# Patient Record
Sex: Male | Born: 1940 | Race: Asian | Hispanic: No | Marital: Married | Smoking: Former smoker
Health system: Southern US, Community
[De-identification: ages and names within clinical notes are randomized; demographics above are authoritative.]

## PROBLEM LIST (undated history)

## (undated) DIAGNOSIS — E785 Hyperlipidemia, unspecified: Secondary | ICD-10-CM

## (undated) DIAGNOSIS — K219 Gastro-esophageal reflux disease without esophagitis: Secondary | ICD-10-CM

## (undated) DIAGNOSIS — D689 Coagulation defect, unspecified: Secondary | ICD-10-CM

## (undated) DIAGNOSIS — I480 Paroxysmal atrial fibrillation: Secondary | ICD-10-CM

## (undated) DIAGNOSIS — H269 Unspecified cataract: Secondary | ICD-10-CM

## (undated) DIAGNOSIS — I714 Abdominal aortic aneurysm, without rupture, unspecified: Secondary | ICD-10-CM

## (undated) DIAGNOSIS — K2970 Gastritis, unspecified, without bleeding: Secondary | ICD-10-CM

## (undated) DIAGNOSIS — R42 Dizziness and giddiness: Secondary | ICD-10-CM

## (undated) DIAGNOSIS — K559 Vascular disorder of intestine, unspecified: Secondary | ICD-10-CM

## (undated) DIAGNOSIS — Z860100 Personal history of colon polyps, unspecified: Secondary | ICD-10-CM

## (undated) DIAGNOSIS — I10 Essential (primary) hypertension: Secondary | ICD-10-CM

## (undated) DIAGNOSIS — M1990 Unspecified osteoarthritis, unspecified site: Secondary | ICD-10-CM

## (undated) HISTORY — DX: Unspecified cataract: H26.9

## (undated) HISTORY — DX: Personal history of colon polyps, unspecified: Z86.0100

## (undated) HISTORY — DX: Dizziness and giddiness: R42

## (undated) HISTORY — DX: Coagulation defect, unspecified: D68.9

## (undated) HISTORY — DX: Unspecified osteoarthritis, unspecified site: M19.90

## (undated) HISTORY — DX: Gastro-esophageal reflux disease without esophagitis: K21.9

## (undated) HISTORY — DX: Personal history of colonic polyps: Z86.010

## (undated) HISTORY — DX: Gastritis, unspecified, without bleeding: K29.70

## (undated) HISTORY — DX: Abdominal aortic aneurysm, without rupture, unspecified: I71.40

## (undated) HISTORY — DX: Essential (primary) hypertension: I10

## (undated) HISTORY — DX: Abdominal aortic aneurysm, without rupture: I71.4

## (undated) HISTORY — DX: Vascular disorder of intestine, unspecified: K55.9

## (undated) HISTORY — DX: Hyperlipidemia, unspecified: E78.5

## (undated) HISTORY — PX: CATARACT EXTRACTION, BILATERAL: SHX1313

## (undated) HISTORY — DX: Paroxysmal atrial fibrillation: I48.0

## (undated) HISTORY — PX: CAROTID STENT: SHX1301

## (undated) HISTORY — PX: HEMICOLECTOMY: SHX854

---

## 1969-01-19 HISTORY — PX: APPENDECTOMY: SHX54

## 2000-01-20 HISTORY — PX: HEMORROIDECTOMY: SUR656

## 2008-10-15 ENCOUNTER — Ambulatory Visit: Payer: Self-pay | Admitting: Internal Medicine

## 2008-10-15 DIAGNOSIS — R42 Dizziness and giddiness: Secondary | ICD-10-CM

## 2008-10-15 DIAGNOSIS — K2990 Gastroduodenitis, unspecified, without bleeding: Secondary | ICD-10-CM

## 2008-10-15 DIAGNOSIS — I10 Essential (primary) hypertension: Secondary | ICD-10-CM

## 2008-10-15 DIAGNOSIS — R0789 Other chest pain: Secondary | ICD-10-CM | POA: Insufficient documentation

## 2008-10-15 DIAGNOSIS — E785 Hyperlipidemia, unspecified: Secondary | ICD-10-CM | POA: Insufficient documentation

## 2008-10-15 DIAGNOSIS — K559 Vascular disorder of intestine, unspecified: Secondary | ICD-10-CM | POA: Insufficient documentation

## 2008-10-15 DIAGNOSIS — K2970 Gastritis, unspecified, without bleeding: Secondary | ICD-10-CM | POA: Insufficient documentation

## 2008-10-25 ENCOUNTER — Ambulatory Visit: Payer: Self-pay | Admitting: Cardiology

## 2008-10-25 ENCOUNTER — Telehealth: Payer: Self-pay | Admitting: Internal Medicine

## 2008-10-25 ENCOUNTER — Ambulatory Visit: Payer: Self-pay

## 2008-10-25 ENCOUNTER — Encounter: Payer: Self-pay | Admitting: Internal Medicine

## 2008-10-25 ENCOUNTER — Ambulatory Visit (HOSPITAL_COMMUNITY): Admission: RE | Admit: 2008-10-25 | Discharge: 2008-10-25 | Payer: Self-pay | Admitting: Cardiology

## 2008-10-31 ENCOUNTER — Encounter: Payer: Self-pay | Admitting: Cardiology

## 2008-10-31 ENCOUNTER — Telehealth: Payer: Self-pay | Admitting: Internal Medicine

## 2008-10-31 ENCOUNTER — Ambulatory Visit: Payer: Self-pay | Admitting: Cardiology

## 2008-11-08 ENCOUNTER — Ambulatory Visit: Payer: Self-pay | Admitting: Internal Medicine

## 2008-11-22 ENCOUNTER — Ambulatory Visit: Payer: Self-pay | Admitting: Internal Medicine

## 2008-11-22 LAB — CONVERTED CEMR LAB
CO2: 23 meq/L (ref 19–32)
Chloride: 105 meq/L (ref 96–112)
Creatinine, Ser: 1.24 mg/dL (ref 0.40–1.50)
Glucose, Bld: 103 mg/dL — ABNORMAL HIGH (ref 70–99)
Sodium: 141 meq/L (ref 135–145)

## 2008-11-29 ENCOUNTER — Telehealth: Payer: Self-pay | Admitting: Internal Medicine

## 2008-11-29 ENCOUNTER — Ambulatory Visit: Payer: Self-pay | Admitting: Internal Medicine

## 2008-11-29 DIAGNOSIS — T6391XA Toxic effect of contact with unspecified venomous animal, accidental (unintentional), initial encounter: Secondary | ICD-10-CM | POA: Insufficient documentation

## 2008-12-05 ENCOUNTER — Ambulatory Visit: Payer: Self-pay | Admitting: Family

## 2008-12-05 DIAGNOSIS — T148XXA Other injury of unspecified body region, initial encounter: Secondary | ICD-10-CM | POA: Insufficient documentation

## 2008-12-07 ENCOUNTER — Telehealth: Payer: Self-pay | Admitting: Internal Medicine

## 2008-12-20 ENCOUNTER — Telehealth: Payer: Self-pay | Admitting: Internal Medicine

## 2008-12-28 ENCOUNTER — Telehealth: Payer: Self-pay | Admitting: Internal Medicine

## 2009-01-31 ENCOUNTER — Ambulatory Visit (HOSPITAL_BASED_OUTPATIENT_CLINIC_OR_DEPARTMENT_OTHER): Admission: RE | Admit: 2009-01-31 | Discharge: 2009-01-31 | Payer: Self-pay | Admitting: Internal Medicine

## 2009-01-31 ENCOUNTER — Ambulatory Visit: Payer: Self-pay | Admitting: Diagnostic Radiology

## 2009-01-31 ENCOUNTER — Ambulatory Visit: Payer: Self-pay | Admitting: Internal Medicine

## 2009-01-31 DIAGNOSIS — K219 Gastro-esophageal reflux disease without esophagitis: Secondary | ICD-10-CM | POA: Insufficient documentation

## 2009-01-31 DIAGNOSIS — G8929 Other chronic pain: Secondary | ICD-10-CM | POA: Insufficient documentation

## 2009-01-31 DIAGNOSIS — R05 Cough: Secondary | ICD-10-CM

## 2009-01-31 DIAGNOSIS — M546 Pain in thoracic spine: Secondary | ICD-10-CM

## 2009-02-18 ENCOUNTER — Telehealth: Payer: Self-pay | Admitting: Internal Medicine

## 2009-04-04 ENCOUNTER — Telehealth: Payer: Self-pay | Admitting: Internal Medicine

## 2009-04-04 ENCOUNTER — Ambulatory Visit: Payer: Self-pay | Admitting: Internal Medicine

## 2009-04-04 ENCOUNTER — Ambulatory Visit: Payer: Self-pay | Admitting: Diagnostic Radiology

## 2009-04-04 ENCOUNTER — Ambulatory Visit (HOSPITAL_BASED_OUTPATIENT_CLINIC_OR_DEPARTMENT_OTHER)
Admission: RE | Admit: 2009-04-04 | Discharge: 2009-04-04 | Payer: Self-pay | Source: Home / Self Care | Admitting: Internal Medicine

## 2009-04-04 DIAGNOSIS — N401 Enlarged prostate with lower urinary tract symptoms: Secondary | ICD-10-CM

## 2009-04-04 DIAGNOSIS — I714 Abdominal aortic aneurysm, without rupture, unspecified: Secondary | ICD-10-CM | POA: Insufficient documentation

## 2009-06-04 ENCOUNTER — Encounter: Payer: Self-pay | Admitting: Internal Medicine

## 2009-06-07 ENCOUNTER — Ambulatory Visit: Payer: Self-pay | Admitting: Internal Medicine

## 2009-06-07 DIAGNOSIS — D126 Benign neoplasm of colon, unspecified: Secondary | ICD-10-CM | POA: Insufficient documentation

## 2009-06-10 ENCOUNTER — Encounter (INDEPENDENT_AMBULATORY_CARE_PROVIDER_SITE_OTHER): Payer: Self-pay | Admitting: *Deleted

## 2009-07-05 ENCOUNTER — Encounter: Payer: Self-pay | Admitting: Internal Medicine

## 2009-07-05 ENCOUNTER — Encounter: Admission: RE | Admit: 2009-07-05 | Discharge: 2009-07-05 | Payer: Self-pay | Admitting: Surgery

## 2009-07-08 ENCOUNTER — Ambulatory Visit: Payer: Self-pay | Admitting: Internal Medicine

## 2009-07-12 ENCOUNTER — Encounter (INDEPENDENT_AMBULATORY_CARE_PROVIDER_SITE_OTHER): Payer: Self-pay

## 2009-07-16 ENCOUNTER — Ambulatory Visit: Payer: Self-pay | Admitting: Gastroenterology

## 2009-07-21 ENCOUNTER — Ambulatory Visit: Payer: Self-pay | Admitting: Diagnostic Radiology

## 2009-07-21 ENCOUNTER — Encounter: Payer: Self-pay | Admitting: Emergency Medicine

## 2009-07-21 ENCOUNTER — Ambulatory Visit: Payer: Self-pay | Admitting: Internal Medicine

## 2009-07-21 ENCOUNTER — Inpatient Hospital Stay (HOSPITAL_COMMUNITY): Admission: EM | Admit: 2009-07-21 | Discharge: 2009-07-22 | Payer: Self-pay | Admitting: Internal Medicine

## 2009-07-23 ENCOUNTER — Ambulatory Visit: Payer: Self-pay | Admitting: Gastroenterology

## 2009-07-23 LAB — HM COLONOSCOPY

## 2009-07-24 ENCOUNTER — Telehealth (INDEPENDENT_AMBULATORY_CARE_PROVIDER_SITE_OTHER): Payer: Self-pay | Admitting: *Deleted

## 2009-07-25 ENCOUNTER — Ambulatory Visit: Payer: Self-pay | Admitting: Internal Medicine

## 2009-07-25 ENCOUNTER — Ambulatory Visit: Payer: Self-pay

## 2009-07-25 ENCOUNTER — Encounter (HOSPITAL_COMMUNITY): Admission: RE | Admit: 2009-07-25 | Discharge: 2009-09-25 | Payer: Self-pay | Admitting: Internal Medicine

## 2009-07-25 ENCOUNTER — Encounter: Payer: Self-pay | Admitting: Internal Medicine

## 2009-07-26 ENCOUNTER — Encounter: Payer: Self-pay | Admitting: Gastroenterology

## 2009-07-29 ENCOUNTER — Encounter (INDEPENDENT_AMBULATORY_CARE_PROVIDER_SITE_OTHER): Payer: Self-pay | Admitting: Surgery

## 2009-07-29 ENCOUNTER — Inpatient Hospital Stay (HOSPITAL_COMMUNITY): Admission: RE | Admit: 2009-07-29 | Discharge: 2009-08-01 | Payer: Self-pay | Admitting: Surgery

## 2009-08-08 ENCOUNTER — Encounter: Payer: Self-pay | Admitting: Internal Medicine

## 2009-08-11 ENCOUNTER — Encounter: Payer: Self-pay | Admitting: Internal Medicine

## 2009-08-12 ENCOUNTER — Encounter: Payer: Self-pay | Admitting: Internal Medicine

## 2009-08-13 ENCOUNTER — Encounter: Payer: Self-pay | Admitting: Internal Medicine

## 2009-08-15 ENCOUNTER — Encounter: Payer: Self-pay | Admitting: Internal Medicine

## 2009-08-19 ENCOUNTER — Ambulatory Visit: Payer: Self-pay | Admitting: Internal Medicine

## 2009-08-19 DIAGNOSIS — I4891 Unspecified atrial fibrillation: Secondary | ICD-10-CM | POA: Insufficient documentation

## 2009-08-19 DIAGNOSIS — R7309 Other abnormal glucose: Secondary | ICD-10-CM | POA: Insufficient documentation

## 2009-08-19 DIAGNOSIS — K56609 Unspecified intestinal obstruction, unspecified as to partial versus complete obstruction: Secondary | ICD-10-CM | POA: Insufficient documentation

## 2009-08-19 LAB — CONVERTED CEMR LAB
Basophils Absolute: 0.1 10*3/uL (ref 0.0–0.1)
Basophils Relative: 1 % (ref 0–1)
Calcium: 9.4 mg/dL (ref 8.4–10.5)
Creatinine, Ser: 1.13 mg/dL (ref 0.40–1.50)
Eosinophils Absolute: 0.1 10*3/uL (ref 0.0–0.7)
Eosinophils Relative: 2 % (ref 0–5)
Free T4: 1.34 ng/dL (ref 0.80–1.80)
HCT: 36.9 % — ABNORMAL LOW (ref 39.0–52.0)
Hemoglobin: 12.8 g/dL — ABNORMAL LOW (ref 13.0–17.0)
MCHC: 34.7 g/dL (ref 30.0–36.0)
Magnesium: 2 mg/dL (ref 1.5–2.5)
Monocytes Absolute: 0.6 10*3/uL (ref 0.1–1.0)
Platelets: 408 10*3/uL — ABNORMAL HIGH (ref 150–400)
RDW: 12.7 % (ref 11.5–15.5)
Sodium: 140 meq/L (ref 135–145)
TSH: 0.932 microintl units/mL (ref 0.350–4.500)

## 2009-08-20 ENCOUNTER — Encounter (INDEPENDENT_AMBULATORY_CARE_PROVIDER_SITE_OTHER): Payer: Self-pay | Admitting: *Deleted

## 2009-08-20 ENCOUNTER — Encounter: Payer: Self-pay | Admitting: Internal Medicine

## 2009-08-23 ENCOUNTER — Encounter: Payer: Self-pay | Admitting: Internal Medicine

## 2009-08-28 ENCOUNTER — Encounter: Payer: Self-pay | Admitting: Internal Medicine

## 2009-09-09 ENCOUNTER — Ambulatory Visit: Payer: Self-pay | Admitting: Internal Medicine

## 2009-09-20 ENCOUNTER — Ambulatory Visit: Payer: Self-pay | Admitting: Internal Medicine

## 2009-10-07 ENCOUNTER — Telehealth: Payer: Self-pay | Admitting: Internal Medicine

## 2009-11-15 ENCOUNTER — Ambulatory Visit: Payer: Self-pay | Admitting: Internal Medicine

## 2009-11-15 DIAGNOSIS — D649 Anemia, unspecified: Secondary | ICD-10-CM

## 2009-11-15 LAB — CONVERTED CEMR LAB
BUN: 19 mg/dL (ref 6–23)
Calcium: 9.4 mg/dL (ref 8.4–10.5)
Chloride: 105 meq/L (ref 96–112)
Creatinine, Ser: 1.28 mg/dL (ref 0.40–1.50)
Hgb A1c MFr Bld: 6.2 % — ABNORMAL HIGH (ref <5.7)

## 2009-11-18 ENCOUNTER — Encounter: Payer: Self-pay | Admitting: Internal Medicine

## 2009-11-20 ENCOUNTER — Encounter: Payer: Self-pay | Admitting: Internal Medicine

## 2009-12-26 ENCOUNTER — Emergency Department (HOSPITAL_COMMUNITY): Admission: EM | Admit: 2009-12-26 | Discharge: 2009-08-11 | Payer: Self-pay | Admitting: Emergency Medicine

## 2010-02-17 ENCOUNTER — Ambulatory Visit (INDEPENDENT_AMBULATORY_CARE_PROVIDER_SITE_OTHER)
Admission: RE | Admit: 2010-02-17 | Discharge: 2010-02-17 | Payer: Medicare Other | Source: Home / Self Care | Attending: Internal Medicine | Admitting: Internal Medicine

## 2010-02-17 DIAGNOSIS — I10 Essential (primary) hypertension: Secondary | ICD-10-CM

## 2010-02-17 DIAGNOSIS — N401 Enlarged prostate with lower urinary tract symptoms: Secondary | ICD-10-CM

## 2010-02-17 DIAGNOSIS — M25569 Pain in unspecified knee: Secondary | ICD-10-CM | POA: Insufficient documentation

## 2010-02-18 NOTE — Progress Notes (Signed)
  Phone Note Outgoing Call   Summary of Call: call pt - abdominal aortic aneurysm has not changed in size.  3.3 cm Initial call taken by: D. Thomos Lemons DO,  April 04, 2009 2:21 PM  Follow-up for Phone Call        informed patient that u/s showed aneurysm has not changed in size. Follow-up by: Michaelle Copas,  April 04, 2009 3:58 PM

## 2010-02-18 NOTE — Letter (Signed)
Summary: [facility]  [facility]   Imported By: Lanelle Bal 10/03/2009 12:09:51  _____________________________________________________________________  External Attachment:    Type:   Image     Comment:   External Document

## 2010-02-18 NOTE — Procedures (Signed)
Summary: [facility]  [facility]   Imported By: Lanelle Bal 07/17/2009 08:01:57  _____________________________________________________________________  External Attachment:    Type:   Image     Comment:   External Document

## 2010-02-18 NOTE — Assessment & Plan Note (Signed)
Summary: 4-6 week eph/a. fib/mt   Visit Type:  Follow-up Primary Provider:  Dondra Spry DO   History of Present Illness: The patient presents today for routine electrophysiology followup. He reports no further symptoms of afib.  He underwent hemicolectomy 7/11 and has subsequently recovered.  He had postop SBO and subsequent hypotension with symptoms of orthostatic dizziness.  These symptoms have resolved upon stopping diovan. The patient denies symptoms of palpitations, chest pain, shortness of breath, orthopnea, PND, lower extremity edema, dizziness, presyncope, syncope, or neurologic sequela. The patient is tolerating medications without difficulties and is otherwise without complaint today.   Current Medications (verified): 1)  Lipitor 20 Mg Tabs (Atorvastatin Calcium) .... Take 1/2 Tablet By Mouth Daily. 2)  Omeprazole 40 Mg Cpdr (Omeprazole) .... One By Mouth Two Times A Day 3)  Metoprolol Tartrate 50 Mg Tabs (Metoprolol Tartrate) .... 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 Appendectomy 1971 Hemorrhoidectomy 2002         Social History: Reviewed history from 08/19/2009 and no changes required. Retired Married 40 years  4 daughters 14, 39, 60, 51       Tobacco Use - Former.   Alcohol Use - no     Review of Systems       All systems are reviewed and negative except as listed in the HPI.   Vital Signs:  Patient profile:   70 year old male Height:      67 inches Weight:      171 pounds BMI:     26.88 Pulse rate:   70 / minute BP sitting:    120 / 74  (left arm)  Vitals Entered By: Laurance Flatten CMA (September 09, 2009 2:48 PM)  Physical Exam  General:  Well developed, well nourished, in no acute distress. Head:  normocephalic and atraumatic Eyes:  PERRLA/EOM intact; conjunctiva and lids normal. Mouth:  Teeth, gums and palate normal. Oral mucosa normal. Neck:  Neck supple, no JVD. No masses, thyromegaly or abnormal cervical nodes. Lungs:  Clear bilaterally to auscultation and percussion. Heart:  Non-displaced PMI, chest non-tender; regular rate and rhythm, S1, S2 without murmurs, rubs or gallops. Carotid upstroke normal, no bruit. Normal abdominal aortic size, no bruits. Femorals normal pulses, no bruits. Pedals normal pulses. No edema, no varicosities. Abdomen:  Bowel sounds positive; abdomen soft and non-tender without masses, organomegaly, or hernias noted. No hepatosplenomegaly. Msk:  Back normal, normal gait. Muscle strength and tone normal. Pulses:  pulses normal in all 4 extremities Extremities:  No clubbing or cyanosis. Neurologic:  Alert and oriented x 3.   Impression & Recommendations:  Problem # 1:  ATRIAL FIBRILLATION, PAROXYSMAL (ICD-427.31) Doing well without recurrence.  His CHADS2 score is 1.  I have recommended ASA.  The patient prefers 81mg  daily and will increase to 325mg  daily if no bleeding occurs.  Continue metoprolol for rhythm control.  Problem # 2:  HYPERTENSION (ICD-401.9) stable  Problem # 3:  CHEST PAIN, ATYPICAL (ICD-786.59) resolved s/p recent low risk myoview no further testing at this time  Patient Instructions: 1)  Your physician recommends that  you schedule a follow-up appointment in: 8 months with Dr Johney Frame 2)  Your physician has recommended you make the following change in your medication: start Aspirin 81mg  daily Prescriptions: METOPROLOL TARTRATE 50 MG TABS (METOPROLOL TARTRATE) Take 1/2 tablet by mouth once daily  #30 x 6   Entered by:   Dennis Bast, RN, BSN   Authorized by:    Hillis Range, MD   Signed by:   Dennis Bast, RN, BSN on 09/09/2009   Method used:   Electronically to        [facility] [id number]* (retail)       [address] [place], [place]  [address]       Ph: [phone]       Fax: [phone]   RxID:   [id number]

## 2010-02-18 NOTE — Letter (Signed)
Summary: Results Letter  [facility]  [address] [place], [place] [address]   Phone: [phone]  Fax: [phone]        July 26, 2009 MRN: [id number]    Brandon Bradford [address] [place], [place]  [address]    Dear [name],   The polyp removed during your recent procedure was proven to be adenomatous.  These are pre-cancerous polyps that may have grown into cancers if they had not been removed.  Based on current nationally recognized surveillance guidelines, I recommend that you have a repeat colonoscopy in 3 years.  I have discussed your upcoming surgery with Dr. Corliss Skains look forward to hearing if I can be of any further help prior to the repeat colonoscopy in 3 years.  We will therefore put your information in our reminder system and will contact you in 3 years to schedule a repeat procedure.  Please call if you have any questions or concerns.       Sincerely,  Rachael Fee MD  This letter has been electronically signed by your physician.  Appended Document: Results Letter letter mailed

## 2010-02-18 NOTE — Assessment & Plan Note (Signed)
Summary: 1 MONTH FOLLOW UP/MHF   Vital Signs:  Patient profile:   70 year old male Height:      67 inches Weight:      172 pounds BMI:     27.04 O2 Sat:      99 % on Room air Temp:     98.6 degrees F oral Pulse rate:   61 / minute Pulse rhythm:   regular Resp:     16 per minute BP sitting:   124 / 70  (right arm) Cuff size:   large  Vitals Entered By: Glendell Docker CMA (September 20, 2009 8:51 AM)  O2 Flow:  Room air CC: 1 Month Follow up  Is Patient Diabetic? No Pain Assessment Patient in pain? no      Comments request maufacturer TEVA for metoprolol   Primary Care Provider:  Dondra Spry DO  CC:  1 Month Follow up .  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    Alcohol Use - no      Physical Exam  General:  alert, well-developed, and well-nourished.   Lungs:  normal respiratory effort and normal breath sounds.   Heart:  normal rate, regular rhythm, and no gallop.   Extremities:  No lower extremity edema   Impression & Recommendations:  Problem # 1:  SMALL BOWEL OBSTRUCTION (ICD-560.9) Assessment Improved resolved. he  never f/u with Dr. Harlon Flor  Problem # 2:  PRE-DIABETES (ICD-790.29) Assessment: Improved A1 c normalized with wt loss  Problem # 3:  HYPERTENSION (ICD-401.9) Assessment: Unchanged  His updated medication list for this problem includes:    Metoprolol Succinate 25 Mg Xr24h-tab (Metoprolol succinate) ..... One by mouth once daily  BP today: 124/70 Prior BP: 120/74 (09/09/2009)  Labs Reviewed: K+: 4.2 (08/19/2009) Creat: : 1.13 (08/19/2009)     Problem # 4:  ATRIAL FIBRILLATION, PAROXYSMAL (ICD-427.31) Assessment: Unchanged  His updated medication list for this problem includes:    Metoprolol Succinate 25 Mg Xr24h-tab (Metoprolol succinate) ..... One by mouth once daily    Aspirin 81 Mg Chew (Aspirin) ..... One by mouth daily  Complete Medication List: 1)  Lipitor 20 Mg Tabs (Atorvastatin calcium) .... Take 1/2 tablet by mouth daily. 2)  Omeprazole 40 Mg Cpdr (Omeprazole) .... One by mouth two times a day 3)  Metoprolol Succinate 25 Mg Xr24h-tab (Metoprolol succinate) .... One by mouth once daily 4)  Aspirin 81 Mg Chew (Aspirin) .... One by mouth daily  Other Orders: Influenza Vaccine MCR [phone]) Administration Flu vaccine - MCR (Q6578)  Patient Instructions: 1)  Please schedule a follow-up appointment in 6 months. 2)  BMP prior to visit, ICD-9:    401.9 3)  HbgA1C prior to visit, ICD-9:  790.29 4)  PSA :  v76.44 5)  Please return for lab work one (1) week before your next appointment.  Prescriptions: METOPROLOL SUCCINATE 25 MG XR24H-TAB (METOPROLOL SUCCINATE) one by mouth once daily  #90 x 1   Entered and Authorized by:   D. Thomos Lemons DO   Signed by:   D. Thomos Lemons DO on 09/20/2009   Method used:   Electronically to        [facility] [phone][facility] (retail)       [address]       [place], [place]  [address]       Ph: [phone]       Fax: [phone]   RxID:   [id number]   Current Allergies (reviewed today): LISINOPRIL  (LISINOPRIL)   Immunizations Administered:  Influenza Vaccine # 1:    Vaccine Type: Fluvax MCR    Site: left deltoid    Mfr: GlaxoSmithKline    Dose: 0.5 ml    Route: IM    Given by: Glendell Docker CMA    Exp. Date: 07/19/2010    Lot #: [id number]    VIS given: 08/13/09 version given September 20, 2009.  Flu Vaccine Consent Questions:    Do you have a history of severe allergic reactions to this vaccine? no    Any prior history of allergic reactions to egg and/or gelatin? no    Do you have a sensitivity to the preservative Thimersol? no    Do you have a past history of Guillan-Barre Syndrome? no    Do you currently have an acute febrile illness? no    Have you ever had a severe reaction to latex? no    Vaccine information given and explained to patient? yes

## 2010-02-18 NOTE — Letter (Signed)
Summary: Primary Care Consult Scheduled Letter  [place] [facility]  [address]   [place], [place] [address]   Phone: [phone]  Fax: [phone]      06/10/2009 MRN: [id number]  Brandon Bradford [address] [place], [place]  [address]    Dear Mr. YAWORSKI,      We have scheduled an appointment for you.  At the recommendation of Dr.YOO, we have scheduled you a consult with DR TSUEI @ [facility]  on July 04, 2009 at [facility].  Their address [address]  [address], [place]    [address]. The office phone number is [phone].  If this appointment day and time is not convenient for you, please feel free to call the office of the doctor you are being referred to at the number listed above and reschedule the appointment.     It is important for you to keep your scheduled appointments. We are here to make sure you are given good patient care. If you have questions or you have made changes to your appointment, please notify us at  [phone], ask for HELEN.    Thank you,  Darral Dash  Patient Care Coordinator [place] at [facility]

## 2010-02-18 NOTE — Assessment & Plan Note (Signed)
Summary: talk about hemicolectomy /mhf   Vital Signs:  Patient profile:   70 year old male Weight:      180.75 pounds BMI:     28.41 O2 Sat:      96 % on Room air Temp:     97.9 degrees F oral Pulse rate:   75 / minute Pulse rhythm:   regular Resp:     16 per minute BP sitting:   114 / 70  (left arm) Cuff size:   large  Vitals Entered By: Glendell Docker CMA (70 July 08, 2009 2:22 PM)  O2 Flow:  Room air CC: Rm 2- Discuss surgery  Comments second opinion on colon surgery, surgery is scheduled for  7/11 with Dr Corliss Skains fo lap right hemicolectomy   Primary Care Provider:  D. Thomos Lemons DO  CC:  Rm 2- Discuss surgery .  History of Present Illness: 70 y/o [place] male for f/u re:  right colon polyp pt and wife met with Dr. Corliss Skains.  they feel comfortable with his as Careers adviser. wife is apprehensive about whether right hemicolectomy is needed. 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   Alcohol Use - no    Physical Exam  General:  alert, well-developed, and well-nourished.   Lungs:  normal respiratory effort and normal breath sounds.   Heart:  normal rate, regular rhythm, and no gallop.   Abdomen:  soft, non-tender, and no masses.     Impression & Recommendations:  Problem # 1:  TUBULOVILLOUS ADENOMA, COLON (ICD-211.3)  Pt with large sessile plaque like polyp carpeting most of the cucum. Pathology showed tubulovillous adenoma.   He met with Dr. Corliss Skains who  recommended right hemicolectomy Wife and pt request second opinion re:  whether this is best tx option.  Refer to Dr Christella Hartigan.    Orders: Gastroenterology Referral (GI)  Complete Medication List: 1)  Metoprolol Succinate 100 Mg Xr24h-tab (Metoprolol succinate) .... One by mouth once daily 2)  Lipitor 40 Mg Tabs (Atorvastatin calcium) .... One by mouth once daily 3)  Omeprazole 40 Mg Cpdr (Omeprazole) .... One by mouth once daily 4)  Diovan 80 Mg Tabs (Valsartan) .... One by mouth qd  Current Allergies (reviewed today): LISINOPRIL (LISINOPRIL)  Appended Document: talk about hemicolectomy /mhf patty, he needs colonoscopy in [facility] my first available (i think I have time July 5th in PM).  can schedule as a direct, personal history of adenomatous polyps.  Needs to be before July 11th (haveing surgery).  Appended Document: talk about hemicolectomy /mhf previsit and colon scheduled

## 2010-02-18 NOTE — Letter (Signed)
   [place] at [facility] [address] [place], [place]  [address]  [place] Phone: [phone]      November 18, 2009   Brandon Bradford [address] [place], [place] [address]  RE:  LAB RESULTS  Dear  Mr. WURZER,  The following is an interpretation of your most recent lab tests.  Please take note of any instructions provided or changes to medications that have resulted from your lab work.  ELECTROLYTES:  Good - no changes needed  KIDNEY FUNCTION TESTS:  Good - no changes needed   DIABETIC STUDIES:  Fair - schedule a follow-up appointment Blood Glucose: 117   HgbA1C: 6.2     Your blood work shows you are a borderline diabetic.  I recommend you reduce your carbohydrate intake to 25-30 grams per meal.  Please avoid concentrated sweets and exercise regularly.       Sincerely Yours,    Dr. Thomos Lemons  Appended Document:  mailed

## 2010-02-18 NOTE — Letter (Signed)
   [place] at [facility] [address] [place], [place]  [address]  [place] Phone: [phone]      August 20, 2009   Jonathin Podolski [address] [place], [place] [address]  RE:  LAB RESULTS  Dear  Mr. CASTAGNA,  The following is an interpretation of your most recent lab tests.  Please take note of any instructions provided or changes to medications that have resulted from your lab work.  ELECTROLYTES:  Good - no changes needed  KIDNEY FUNCTION TESTS:  Good - no changes needed  THYROID STUDIES:  Thyroid studies normal TSH: 0.932     DIABETIC STUDIES:  Excellent - no changes needed Blood Glucose: 100   HgbA1C: 5.5     CBC:  Good - no changes needed       Sincerely Yours,    Dr. Thomos Lemons

## 2010-02-18 NOTE — Medication Information (Signed)
Summary: Lipitor Clarification/[facility]  Lipitor Clarification/Right Source   Imported By: Lanelle Bal 11/30/2009 09:57:34  _____________________________________________________________________  External Attachment:    Type:   Image     Comment:   External Document

## 2010-02-18 NOTE — Assessment & Plan Note (Signed)
Summary: 1 month follow up/mhf rsch per pt/dt--Rm 2   Vital Signs:  Patient profile:   70 year old male Height:      67 inches Weight:      178.75 pounds BMI:     28.10 Temp:     98.0 degrees F oral Pulse rate:   78 / minute Pulse rhythm:   regular Resp:     16 per minute BP sitting:   126 / 70  (right arm) Cuff size:   regular  Vitals Entered By: Mervin Kung CMA Duncan Dull) (November 15, 2009 8:06 AM) CC: 1 month f/u. Is Patient Diabetic? No Pain Assessment Patient in pain? no      Comments Pt states Lipitor is 40mg  1/2 daily instead of 20mg  1/2 daily.   Pt will need refills on : Omeptrazole, lipitor, metoprolol and diovan sent to [facility]. Nicki Guadalajara Fergerson CMA Duncan Dull)  November 15, 2009 8:11 AM    Primary Care Brynne Doane:  Dondra Spry DO  CC:  1 month f/u.Marland Kitchen  History of Present Illness:  Hypertension Follow-Up      This is a 70 year old man who presents for Hypertension follow-up.  The patient reports headaches, but denies lightheadedness.  The patient denies the following associated symptoms: chest pain.  Compliance with medications (by patient report) has been near 100%.  The patient reports that dietary compliance has been fair.  The patient reports exercising 3-4X per week.    still gets dizzy for a few seconds when he bends forward  prediabetes - wt back to prev levels before right hemicolectomy  P A fib - feels like heart occ skips a beat but denies palpitations he stopped taking  vitamins which he felt was exacerbating palpitations   Preventive Screening-Counseling & Management  Alcohol-Tobacco     Alcohol drinks/day: 0     Alcohol Counseling: not indicated; patient does not drink     Smoking Status: quit     Packs/Day: 1.0     Year Started: 1962     Year Quit: 1997     Tobacco Counseling: not to resume use of tobacco products  Allergies: 1)  Lisinopril (Lisinopril)  Past History:  Past Medical History: HYPERTENSION (ICD-401.9)    HYPERLIPIDEMIA (ICD-272.4) Hx of GASTRITIS (ICD-535.50)    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    Alcohol Use - no       Review of Systems       sinus congestion  Physical Exam  General:  alert, well-developed, and well-nourished.   Head:  normocephalic and atraumatic.   Ears:  R ear normal and L ear normal.   Mouth:  pharynx pink and moist.   Lungs:  normal respiratory effort and normal breath sounds.   Heart:  normal rate, regular rhythm, and no gallop.   Extremities:  No lower extremity edema Neurologic:  cranial nerves II-XII intact and gait normal.   Psych:  normally interactive, good eye contact, not anxious appearing, and not depressed appearing.     Impression & Recommendations:  Problem # 1:  HYPERTENSION (ICD-401.9)  Assessment Unchanged  His updated medication list for this problem includes:    Metoprolol Succinate 50 Mg Xr24h-tab (Metoprolol succinate) ..... One by mouth once daily    Diovan 160 Mg Tabs (Valsartan) ..... One by mouth once daily  Orders: T-Basic Metabolic Panel [phone])  BP today: 126/70 Prior BP: 124/70 (09/20/2009)  Labs Reviewed: K+: 4.2 (08/19/2009) Creat: : 1.13 (08/19/2009)     Problem # 2:  PRE-DIABETES (ICD-790.29) Pt counseled on diet and exercise. 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  I doubt ability to absorb iron or B12 affected. check labs before next OV  Hgb: 12.8 (08/19/2009)   Hct: 36.9 (08/19/2009)   Platelets: 408 (08/19/2009) RBC: 4.07 (08/19/2009)   RDW: 12.7 (08/19/2009)   WBC: 5.5 (08/19/2009) MCV: 90.7  (08/19/2009)   MCHC: 34.7 (08/19/2009) TSH: 0.932 (08/19/2009)  Complete Medication List: 1)  Lipitor 40 Mg Tabs (Atorvastatin calcium) .... 1/2 by mouth qd 2)  Omeprazole 40 Mg Cpdr (Omeprazole) .... One by mouth two times a day 3)  Metoprolol Succinate 50 Mg Xr24h-tab (Metoprolol succinate) .... One by mouth once daily 4)  Aspirin 81 Mg Chew (Aspirin) .... One by mouth daily 5)  Diovan 160 Mg Tabs (Valsartan) .... One by mouth once daily  Patient Instructions: 1)  Please schedule a follow-up appointment in 6 months. 2)  BMP prior to visit, ICD-9:  401.9 3)  CBC  prior to visit, ICD-9: 285.9 4)  B12 level:  285.9 5)  Serum iron, TIBC, Ferritin:  285.9 6)  HbgA1C prior to visit, ICD-9: 790.29 7)  PSA :  v76.44 8)  Please return for lab work one (1) week before your next appointment.  Prescriptions: DIOVAN 160 MG TABS (VALSARTAN) one by mouth once daily  #90 x 3   Entered and Authorized by:   D. Thomos Lemons DO   Signed by:   D. Thomos Lemons DO on 11/15/2009   Method used:   Faxed to ...       [facility] (mail[facility]order)       [address]       [place], [place]  [id number]       Ph: [phone]       Fax: [phone]   RxID:   [id number] METOPROLOL SUCCINATE 50 MG XR24H-TAB (METOPROLOL SUCCINATE) one by mouth once daily  #90 x 3   Entered and Authorized by:   D. Thomos Lemons DO   Signed by:   D. Thomos Lemons DO on 11/15/2009   Method used:   Faxed to ...       [facility] ([facility])       [address]       [place], [place]  [id number]       Ph: [phone]       Fax: [phone]   RxID:   [id number] OMEPRAZOLE 40 MG CPDR (OMEPRAZOLE) one by mouth two times a day  #180 x 1   Entered and Authorized by:   D. Thomos Lemons DO   Signed by:   D. Thomos Lemons DO on 11/15/2009   Method used:   Faxed to ...       [facility] ([facility]order)       [address]       [place], [place]  [id number]       Ph: [phone]       Fax: [phone]    RxID:  [id number] LIPITOR 40 MG TABS (ATORVASTATIN CALCIUM) 1/2 by mouth qd  #90 x 3   Entered and Authorized by:   D. Thomos Lemons DO   Signed by:   D. Thomos Lemons DO on 11/15/2009   Method used:   Faxed to ...       [facility] ([facility])       [address]       [place], [place]  [id number]       Ph: [phone]       Fax: [phone]   RxID:   [id number] DIOVAN 160 MG TABS (VALSARTAN) one by mouth once daily  #90 x 3   Entered and Authorized by:   D. Thomos Lemons DO   Signed by:   D. Thomos Lemons DO on 11/15/2009   Method used:   Print then Give to Patient   RxID:   [id number] METOPROLOL SUCCINATE 50 MG XR24H-TAB (METOPROLOL SUCCINATE) one by mouth once daily  #90 x 3   Entered and Authorized by:   D. Thomos Lemons DO   Signed by:   D. Thomos Lemons DO on 11/15/2009   Method used:   Print then Give to Patient   RxID:   [id number] LIPITOR 40 MG TABS (ATORVASTATIN CALCIUM) 1/2 by mouth qd  #90 x 3   Entered and Authorized by:   D. Thomos Lemons DO   Signed by:   D. Thomos Lemons DO on 11/15/2009   Method used:   Print then Give to Patient   RxID:   [id number]    Orders Added: 1)  T-Basic Metabolic Panel [[id number]] 2)  T- Hemoglobin A1C [83036-23375] 3)  Est. Patient Level III [09381]    Current Allergies (reviewed today): LISINOPRIL (LISINOPRIL)

## 2010-02-18 NOTE — Letter (Signed)
[facility]   Imported By: Lanelle Bal 10/03/2009 12:03:42  _____________________________________________________________________  External Attachment:    Type:   Image     Comment:   External Document

## 2010-02-18 NOTE — Miscellaneous (Signed)
Summary: Lec previsit  Clinical Lists Changes  Medications: Added new medication of MOVIPREP 100 GM  SOLR (PEG-KCL-NACL-NASULF-NA ASC-C) As per prep instructions. - Signed Rx of MOVIPREP 100 GM  SOLR (PEG-KCL-NACL-NASULF-NA ASC-C) As per prep instructions.;  #1 x 0;  Signed;  Entered by: Ulis Rias RN;  Authorized by: Rachael Fee MD;  Method used: Electronically to [id number] [address] [place], [place], [place]  [address], Ph: [phone], Fax: [phone] Observations: Added new observation of ALLERGY REV: Done (07/16/2009 13:22)    Prescriptions: MOVIPREP 100 GM  SOLR (PEG-KCL-NACL-NASULF-NA ASC-C) As per prep instructions.  #1 x 0   Entered by:   Ulis Rias RN   Authorized by:   Rachael Fee MD   Signed by:   Ulis Rias RN on 07/16/2009   Method used:   Electronically to        [facility] (retail)       [address] [place], [place]  [address]       Ph: [phone]       Fax: [phone]   RxID:   [id number]

## 2010-02-18 NOTE — Procedures (Signed)
Summary: Colonoscopy  Patient: Halil Rentz Note: All result statuses are Final unless otherwise noted.  Tests: (1) Colonoscopy (COL)   COL Colonoscopy           DONE     [facility]     [address]     [place], [place]  [address]           COLONOSCOPY PROCEDURE REPORT           PATIENT:  Domenick, Quebedeaux  MR#:  [id number]     BIRTHDATE:  01-28-1940, 68 yrs. old  GENDER:  male     ENDOSCOPIST:  Rachael Fee, MD     REF. BY:  Thomos Lemons, DO     PROCEDURE DATE:  07/23/2009     PROCEDURE:  Colonoscopy with snare polypectomy     ASA CLASS:  Class II     INDICATIONS:  recent colonoscopy by Dr. Loman Chroman (large cecal polyp,     several other smaller ones.the large cecal polyp was determined     not to be safely removable by colonoscopy, patient referred to     surgeon but patient wanted a second opinion by another     gastroenterologist before the scheduled segmental colectomy next     week). Dr Loman Chroman documented on the recent colonsocopy that he had     resected the cecal polyp, piecemeal fashion in 2010.     MEDICATIONS:  Fentanyl 75 mcg IV, Versed 7 mg IV           DESCRIPTION OF PROCEDURE:   After the risks benefits and     alternatives of the procedure were thoroughly explained, informed     consent was obtained.  Digital rectal exam was performed and     revealed no rectal masses.   The LB [id number]-[id number] endoscope     was introduced through the anus and advanced to the cecum, which     was identified by both the appendix and ileocecal valve, without     limitations.  The quality of the prep was good, using MoviPrep.     The instrument was then slowly withdrawn as the colon was fully     examined.     <<PROCEDUREIMAGES>>           FINDINGS:  Three polyps were found. One was 4mm, sessile, located     in descending colon, removed with cold snare and sent to pathology     (jar 1). One was 1.5-2cm , sessile, located in hepatic flexure,     clearly had been partially  resected recently and there was [place]     Ink staining the colonic mucosa around this polyp. Lastly, there     was a carpetting, villous polyp that covered 1/3 to 1/2 the cecum     and appeared to be growing somewhat into the IC valve. This     largest polyp measured 3-4cm across maximally (see image1, image2,     image3, image4, image6, image7, and image8).  This was otherwise a     normal examination of the colon (see image9).   Retroflexed views     in the rectum revealed no abnormalities.    The scope was then     withdrawn from the patient and the procedure completed.           COMPLICATIONS:  None           ENDOSCOPIC IMPRESSION:     1) Three  polyps, see above decription.  Only one was removed,     sent to pathology.  I don't think that either of the other two     polyps can be removed safely by colonoscopy.     2) Otherwise normal examination           RECOMMENDATIONS:     Would proceed with upcoming surgical resection for polyps.  I     have left a message with Dr. Corliss Skains regarding the TWO polyp sites     that will need to be included in resection specimine (one in     cecum, the other at hepatic flexure that was marked with [place] Ink     by Dr. Loman Chroman during 05/2009 colonoscopy).           REPEAT EXAM:  3 years           ______________________________     Rachael Fee, MD           cc: Octaviano Glow, MD           n.     Rosalie Doctor:   Rachael Fee at 07/23/2009 02:32 PM           [name], [id number]  Note: An exclamation mark (!) indicates a result that was not dispersed into the flowsheet. Document Creation Date: 07/23/2009 2:32 PM _______________________________________________________________________  (1) Order result status: Final Collection or observation date-time: 07/23/2009 14:14 Requested date-time:  Receipt date-time:  Reported date-time:  Referring Physician:   Ordering Physician: Rob Bunting [phone]) Specimen Source:  Source: Launa Grill  Order Number: [id number] Lab site:   Appended Document: Colonoscopy     Procedures Next Due Date:    Colonoscopy: 07/2012

## 2010-02-18 NOTE — Letter (Signed)
[facility]   Imported By: Lanelle Bal 07/17/2009 08:03:15  _____________________________________________________________________  External Attachment:    Type:   Image     Comment:   External Document

## 2010-02-18 NOTE — Letter (Signed)
Summary: [facility]   Imported By: Lanelle Bal 08/30/2009 12:26:36  _____________________________________________________________________  External Attachment:    Type:   Image     Comment:   External Document

## 2010-02-18 NOTE — Assessment & Plan Note (Signed)
Summary: hospital follow up/mhf   Vital Signs:  Patient profile:   70 year old male Height:      67 inches Weight:      163.25 pounds BMI:     25.66 O2 Sat:      97 % on Room air Temp:     98.0 degrees F oral Pulse rate:   71 / minute Pulse rhythm:   regular Resp:     16 per minute BP sitting:   108 / 60  (right arm) Cuff size:   regular  Vitals Entered By: Glendell Docker CMA (August 19, 2009 2:20 PM)  O2 Flow:  Room air CC: Rm 3- Hospital Follow up  Is Patient Diabetic? No Pain Assessment Patient in pain? no      Comments concerned blood  pressure and pulse is fluctuating for the past 3 days.   Primary Care Provider:  Dondra Spry DO  CC:  Rm 3- Hospital Follow up .  History of Present Illness: 70 y/o [place] male for follow up.  Pt underwent right hemicolectomy on 7/12.  pt reports doing well soon after surgery/discharge  but on 7/23 -7/24 he experienced severe nausea and abd bloating.  pt seen in [facility] ER.  abd x ray showed evidence of small bowel obstruction.  pt was discharged from ER. He continued to experience significant abd discomfort.  pt sought care at [facility] medical center where he was admitted for SBO NG tube was placed and SBO resolved on 3 rd day he did not require surgical intervention according to his wife  since hosp discharge pt has slowly resumed diet  he has been mainly eating rice porridge BMs are small in caliaber.  he has some constipation  pre op - he was seen by cardiology for P Afib wife has been checking his BP at home.  occ SBP in 90's.  he feels light headed pulse ranges from 70's to 110. mild dizziness with higher pulse reading  elevated blood sugar also noted during hospitalization. 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   Alcohol Use - no     Review of Systems       The patient complains of weight loss.  The patient denies abdominal pain and severe indigestion/heartburn.         no nausea or vomiting  Physical Exam  General:  alert, well-developed, and well-nourished.   Head:  normocephalic and atraumatic.   Eyes:  pupils equal, pupils round, and pupils reactive to light.   Neck:  supple and no masses.   Lungs:  normal respiratory effort and normal breath sounds.   Heart:  normal rate, regular rhythm, and no gallop.   Abdomen:  soft and non-tender.  lower abd scar well healed Extremities:  No lower extremity  edema   Impression & Recommendations:  Problem # 1:  SMALL BOWEL OBSTRUCTION (ICD-560.9) 70 y/o [place] male suffered post op SBO after right hemicolectomy.   He was evaluated and tx'ed at [facility].  SBO symptoms resolved with NGT.  BMs small caliber.  Obtain copy of records from [place]. copy note to Dr. Corliss Skains Orders: Surgical Referral (Surgery)  Problem # 2:  HYPERTENSION (ICD-401.9) Pt having lower BP at home.  change diovan to losartan.  HR labile on metoprolol.  change to bisoprolol  The following medications were removed from the medication list:    Metoprolol Succinate 100 Mg Xr24h-tab (Metoprolol succinate) ..... One by mouth once daily    Diovan 80 Mg Tabs (Valsartan) ..... One by mouth qd His updated medication list for this problem includes:    Losartan Potassium 25 Mg Tabs (Losartan potassium) ..... One by mouth once daily    Bisoprolol Fumarate 5 Mg  Tabs (Bisoprolol fumarate) ..... One by mouth once daily  Orders: T-Basic Metabolic Panel [phone])  Problem # 3:  ATRIAL FIBRILLATION, PAROXYSMAL (ICD-427.31) HR normal today.  he has f/u with Dr. Johney Frame.  he was converted with flecanide.  we discussed pros and cons of anticoagulation [place] score 1 or 2.  unclear if previous blood sugar obtained during hospitalization fasting.  A1c is 6.1 (less than cut off of 6.5)  Hold off on anticoagulation until GI issues completely resolved.  I would favor ASA vs full anticoag The following medications were removed from the medication list:    Metoprolol Succinate 100 Mg Xr24h-tab (Metoprolol succinate) ..... One by mouth once daily His updated medication list for this problem includes:    Bisoprolol Fumarate 5 Mg Tabs (Bisoprolol fumarate) ..... One by mouth once daily  Problem # 4:  GERD (ICD-530.81) he reports throat and abd irritation from NGT.  use PPI two times a day x 2 weeks then resume qday dosing  His updated medication list for this problem includes:    Omeprazole 40 Mg Cpdr (Omeprazole) ..... One by mouth once daily  Problem # 5:  PRE-DIABETES (ICD-790.29) monitor A1c.  avoid sweets.  additional lifestyle changes after full recovery from abd surgery Orders: T- Hemoglobin A1C ([id number])  Labs Reviewed: Creat: 1.24 (11/22/2008)     Complete Medication List: 1)  Lipitor 40 Mg Tabs (Atorvastatin calcium) .... One by mouth once daily 2)  Omeprazole 40 Mg Cpdr (Omeprazole) .... One by mouth once daily 3)  Losartan Potassium 25 Mg Tabs (Losartan potassium) .... One by mouth once daily 4)  Bisoprolol Fumarate 5 Mg Tabs (Bisoprolol fumarate) .... One by mouth once daily  Other Orders: T-CBC w/Diff [id number]) T-Magnesium [phone]) T-TSH [id number]) T-T4, Free [phone])  Patient Instructions: 1)  Please schedule a follow-up appointment in 1 month. Prescriptions: BISOPROLOL FUMARATE 5 MG TABS (BISOPROLOL FUMARATE)  one by mouth once daily  #30 x 1   Entered and Authorized by:   D. Thomos Lemons DO   Signed by:   D. Thomos Lemons DO on 08/19/2009   Method used:   Electronically to        [facility] (retail)       [address] [place], [place]  [address]       Ph: [phone]       Fax: [phone]   RxID:   [id number] LOSARTAN POTASSIUM 25 MG TABS (LOSARTAN POTASSIUM) one by mouth once  daily  #30 x 1   Entered and Authorized by:   D. Thomos Lemons DO   Signed by:   D. Thomos Lemons DO on 08/19/2009   Method used:   Electronically to        [facility] (retail)       [address] [place], [place]  [address]       Ph: [phone]       Fax: [phone]   RxID:   [id number]   Current Allergies (reviewed today): LISINOPRIL (LISINOPRIL)

## 2010-02-18 NOTE — Progress Notes (Signed)
Summary: Nuclear pre procedure  Phone Note Outgoing Call Call back at [facility] Phone [phone]   Call placed by: Rea College, CMA,  July 24, 2009 8:53 AM Call placed to: Patient Summary of Call: Left message with information on Myoview Information Sheet (see scanned document for details).      Nuclear Med Background Indications for Stress Test: Evaluation for Ischemia, Post Hospital  Indications Comments: 07/22/09 [facility] with afib., converted with flecainide; negative enzymes.  History: Echo  History Comments: '10 Echo:EF=60%; 3/11 AAA (3.4 cm)  Symptoms: Dizziness, Palpitations, Rapid HR  Symptoms Comments: c/o left shoulder pain after playing golf.   Nuclear Pre-Procedure Cardiac Risk Factors: History of Smoking, Hypertension, Lipids Height (in): 67

## 2010-02-18 NOTE — Procedures (Signed)
Summary: [facility]   Imported By: Lanelle Bal 06/18/2009 08:26:49  _____________________________________________________________________  External Attachment:    Type:   Image     Comment:   External Document

## 2010-02-18 NOTE — Assessment & Plan Note (Signed)
Summary: Cardiology Nuclear Study  Nuclear Med Background Indications for Stress Test: Evaluation for Ischemia, Post Hospital  Indications Comments: 07/22/09 [facility] with afib., converted with flecainide; negative enzymes.  History: Echo  History Comments: '10 Echo:EF=60%; 3/11 AAA (3.4 cm)  Symptoms: Dizziness, Palpitations, Rapid HR  Symptoms Comments: c/o left shoulder pain after playing golf.   Nuclear Pre-Procedure Cardiac Risk Factors: History of Smoking, Hypertension, Lipids Caffeine/Decaff Intake: None NPO After: 6:30 PM Lungs: clear IV 0.9% NS with Angio Cath: 18g     IV Site: (R) AC IV Started by: Stanton Kidney EMT-P Chest Size (in) 42     Height (in): 67 Weight (lb): 178 BMI: 27.98 Tech Comments: Metoprolol held this am, per Patient.  Nuclear Med Study 1 or 2 day study:  1 day     Stress Test Type:  Stress Reading MD:  Dietrich Pates, MD     Referring MD:  J.Allred Resting Radionuclide:  Technetium 65m Tetrofosmin     Resting Radionuclide Dose:  11 mCi  Stress Radionuclide:  Technetium 92m Tetrofosmin     Stress Radionuclide Dose:  33 mCi   Stress Protocol Exercise Time (min):  10:00 min     Max HR:  146 bpm     Predicted Max HR:  152 bpm  Max Systolic BP: 166 mm Hg     Percent Max HR:  96.05 %     METS: 11.70 Rate Pressure Product:  91478    Stress Test Technologist:  Milana Na EMT-P     Nuclear Technologist:  Domenic Polite CNMT  Rest Procedure  Myocardial perfusion imaging was performed at rest 45 minutes following the intravenous administration of Myoview Technetium 27m Tetrofosmin.  Stress Procedure  The patient exercised for 10:00. The patient stopped due to fatigue and chest tightness.  There were no significant ST-T wave changes and rare pacs.  Myoview was injected at peak exercise and myocardial perfusion imaging was performed after a brief delay.  QPS Raw Data Images:  Soft tissue (diaphragm, bowel activity) underlies heart. Stress Images:  Thinning  with decreased tracer counts in the inferior wall (base).  Otherwise normal perfusion Rest Images:  No significant change from the stress images. Subtraction (SDS):  No evidence of ischemia. Transient Ischemic Dilatation:  .86  (Normal <1.22)  Lung/Heart Ratio:  .3  (Normal <0.45)  Quantitative Gated Spect Images QGS EDV:  71 ml QGS ESV:  22 ml QGS EF:  70 %   Overall Impression  Exercise Capacity: Excellent exercise capacity. BP Response: Normal blood pressure response. Clinical Symptoms: Moderate chest pain. ECG Impression: No significant ST segment change suggestive of ischemia. Overall Impression Comments: Probable normal perfusion and very mild soft tissue attenuation (diaphragm).  No evidence of significant scar/ischemia.  Appended Document: Cardiology Nuclear Study Low risk nuclear study will continue current regimen  Appended Document: Cardiology Nuclear [facility] with results

## 2010-02-18 NOTE — Assessment & Plan Note (Signed)
Summary: 2 month follow up/mhf   Vital Signs:  Patient profile:   70 year old male Weight:      180 pounds BMI:     28.29 O2 Sat:      96 % on Room air Temp:     97.9 degrees F oral Pulse rate:   74 / minute Pulse rhythm:   regular Resp:     12 per minute BP sitting:   128 / 78  (right arm) Cuff size:   regular  Vitals Entered By: Mervin Kung CMA (April 04, 2009 10:09 AM)  O2 Flow:  Room air CC: room 4  2 month follow up on blood pressure Is Patient Diabetic? No   Primary Care Provider:  Dondra Spry DO  CC:  room 4  2 month follow up on blood pressure.  History of Present Illness:  Hypertension Follow-Up      This is a 69 year old man who presents for Hypertension follow-up.  The patient denies lightheadedness.  The patient denies the following associated symptoms: chest pain.  Compliance with medications (by patient report) has been near 100%.  The patient reports that dietary compliance has been fair.  The patient reports exercising 3-4X per week.  cough much better since stopping ACE. Likes to golf with his wife.  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  Alcohol Use - no  Review of Systems       he reports nocturia and weak urinary stream.  he has hx of small AAA.  it has been >1 yr since last surveillance u/s  Physical Exam  General:  alert, well-developed, and  well-nourished.   Lungs:  normal respiratory effort and normal breath sounds.   Heart:  normal rate, regular rhythm, and no gallop.   Abdomen:  soft and non-tender.  no abd bruit Extremities:  No lower extremity edema    Impression & Recommendations:  Problem # 1:  HYPERTENSION (ICD-401.9) well controlled.  Maintain current medication regimen.  The following medications were removed from the medication list:    Diovan 80 Mg Tabs (Valsartan) ..... One by mouth once daily His updated medication list for this problem includes:    Metoprolol Succinate 100 Mg Xr24h-tab (Metoprolol succinate) ..... One by mouth once daily    Diovan 80 Mg Tabs (Valsartan) ..... One by mouth qd  BP today: 128/78 Prior BP: 120/60 (01/31/2009)  Labs Reviewed: K+: 4.6 (11/22/2008) Creat: : 1.24 (11/22/2008)     Problem # 2:  ABDOMINAL AORTIC ANEURYSM (ICD-441.4) He has hx of small AAA.  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  start flomax.  pt to monitor for dizziness  His updated medication list for this problem includes:    Tamsulosin Hcl 0.4 Mg Caps (Tamsulosin hcl) ..... One by mouth at bedtime  Complete Medication List: 1)  Metoprolol Succinate 100 Mg Xr24h-tab (Metoprolol succinate) .... One by mouth once daily 2)  Lipitor 40 Mg Tabs (Atorvastatin calcium) .... One by mouth once daily 3)  Omeprazole 40 Mg Cpdr (Omeprazole) .... One by mouth once daily 4)  Diovan 80 Mg Tabs (Valsartan) .... One by mouth qd 5)  Tamsulosin Hcl 0.4 Mg Caps (Tamsulosin hcl) .... One by mouth at bedtime  Patient Instructions: 1)  Please schedule a follow-up appointment in 3 months. Prescriptions: DIOVAN 80 MG TABS (VALSARTAN) one by mouth qd  #90 x 3   Entered and Authorized by:   D. Thomos Lemons DO   Signed by:   D. Thomos Lemons DO on 04/04/2009   Method used:   Faxed to ...        [facility] (mail-order)       [address]       [place], [place]  [id number]       Ph: [phone]       Fax: [phone]   RxID:   [id number] OMEPRAZOLE 40 MG CPDR (OMEPRAZOLE) one by mouth once daily  #90 x 3   Entered and Authorized by:   D. Thomos Lemons DO   Signed by:   D. Thomos Lemons DO on 04/04/2009   Method used:   Faxed to ...       [facility] (mail-order)       [address]       [place], [place]  [id number]       Ph: [phone]       Fax: [phone]   RxID:   [id number] LIPITOR 40 MG TABS (ATORVASTATIN CALCIUM) one by mouth once daily  #90 x 3   Entered and Authorized by:   D. Thomos Lemons DO   Signed by:   D. Thomos Lemons DO on 04/04/2009   Method used:   Faxed to ...       [facility] (mail-order)       [address]       [place], [place]  [id number]       Ph: [phone]       Fax: [phone]   RxID:   [id number] METOPROLOL SUCCINATE 100 MG XR24H-TAB (METOPROLOL SUCCINATE) one by mouth once daily  #90 x 3   Entered and Authorized by:   D. Thomos Lemons DO   Signed by:   D. Thomos Lemons DO on 04/04/2009   Method used:   Faxed to ...       [facility] (mail-order)       [address]       [place], [place]  [id number]       Ph: [phone]       Fax: [phone]   RxID:   [id number] TAMSULOSIN HCL 0.4 MG CAPS (TAMSULOSIN HCL) one by mouth at bedtime  #30 x 3   Entered and Authorized by:   D. Thomos Lemons DO   Signed by:   D. Thomos Lemons DO on 04/04/2009   Method used:   Electronically to        [facility] (retail)       [address] [place], [place]  [address]       Ph: [phone]       Fax: [phone]   RxID:   [id number] DIOVAN 80 MG TABS (  VALSARTAN) one by mouth qd  #90 x 1   Entered and Authorized by:   D. Thomos Lemons DO   Signed by:   D. Thomos Lemons DO on 04/04/2009   Method used:   Electronically to        [facility] (retail)       [address] [place], [place]  [address]       Ph: [phone]       Fax: [phone]   RxID:   [id number]    Current Allergies (reviewed today): LISINOPRIL (LISINOPRIL)

## 2010-02-18 NOTE — Assessment & Plan Note (Signed)
Summary: talk about surgery polp in colon/dt   Vital Signs:  Patient profile:   70 year old male Weight:      177 pounds BMI:     27.82 O2 Sat:      96 % on Room air Temp:     98.4 degrees F oral Pulse rate:   65 / minute Pulse rhythm:   regular Resp:     16 per minute BP sitting:   110 / 60  (right arm) Cuff size:   regular  Vitals Entered By: Glendell Docker CMA (Jun 07, 2009 3:05 PM)  O2 Flow:  Room air CC: Rm 2-  Discuss Surgery Comments had colonscopy on Tuesday in [facility], and was advised he had to have a polyp surgically removed   Primary Care Provider:  Dondra Spry DO  CC:  Rm 2-  Discuss Surgery.  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   Alcohol Use - no    Physical Exam  General:  alert, well-developed, and well-nourished.   Lungs:  normal respiratory effort and normal breath sounds.   Heart:  normal rate, regular rhythm, and no gallop.   Abdomen:  soft, non-tender, normal bowel sounds, no masses, no hepatomegaly, and no splenomegaly.   Extremities:  No lower extremity edema    Impression & Recommendations:  Problem # 1:  TUBULOVILLOUS ADENOMA, COLON (ICD-211.3)  Pt with large sessile plaque like polyp carpeting most of the cucum. Pathology showed villous adenoma.  We discussed tx options. He was not  comfortable with surgeons in [place].  I suggest consultation with Dr. Harlon Flor to see if laproscopic resection of cecum is option.  Orders: Surgical Referral (Surgery)  Complete Medication List: 1)  Metoprolol Succinate 100 Mg Xr24h-tab (Metoprolol succinate) .... One by mouth once daily 2)  Lipitor 40 Mg Tabs (Atorvastatin calcium) .... One by mouth once daily 3)  Omeprazole 40 Mg Cpdr (Omeprazole) .... One by mouth once daily 4)  Diovan 80 Mg Tabs (Valsartan) .... One by mouth qd  Current Allergies (reviewed today): LISINOPRIL (LISINOPRIL)

## 2010-02-18 NOTE — Letter (Signed)
Summary: Primary Care Consult Scheduled Letter  [place] [facility]  [address]   [place], [place] [address]   Phone: [phone]  Fax: [phone]      08/20/2009 MRN: [id number]  Brandon Bradford [address] [place], [place]  [address]    Dear [name],      We have scheduled an appointment for you.  At the recommendation of Dr.YOO, we have scheduled you a consult with DR Corliss Skains, [facility]  on September 11, 2009 at 1:30PM.  Their address is_[address], [place]  . The office phone number is [phone].  If this appointment day and time is not convenient for you, please feel free to call the office of the doctor you are being referred to at the number listed above and reschedule the appointment.     It is important for you to keep your scheduled appointments. We are here to make sure you are given good patient care. If you have questions or you have made changes to your appointment, please notify us at  [phone], ask for HELEN.    Thank you,   Darral Dash Patient Care Coordinator [place] at [facility]

## 2010-02-18 NOTE — Letter (Signed)
Summary: [facility]   Imported By: Lanelle Bal 10/03/2009 12:05:05  _____________________________________________________________________  External Attachment:    Type:   Image     Comment:   External Document

## 2010-02-18 NOTE — Letter (Signed)
Summary: [facility] Instructions  [facility]  [address] [place], [place] [address]   Phone: [phone]  Fax: [phone]       Brandon Bradford    Oct 22, 1940    MRN: [id number]        Procedure Day /Date: Tuesday 07/23/09     Arrival Time: 1:00 p.m.      Procedure Time: 2:00 p.m.     Location of Procedure:                    _x_  [facility] (4th Floor)   PREPARATION FOR COLONOSCOPY WITH MOVIPREP   Starting 5 days prior to your procedure  07/18/09 do not eat nuts, seeds, popcorn, corn, beans, peas,  salads, or any raw vegetables.  Do not take any fiber supplements (e.g. Metamucil, Citrucel, and Benefiber).  THE DAY BEFORE YOUR PROCEDURE         DATE:  07/22/09  DAY:  Monday  1.  Drink clear liquids the entire day-NO SOLID FOOD  2.  Do not drink anything colored red or purple.  Avoid juices with pulp.  No orange juice.  3.  Drink at least 64 oz. (8 glasses) of fluid/clear liquids during the day to prevent dehydration and help the prep work efficiently.  CLEAR LIQUIDS INCLUDE: Water Jello Ice Popsicles Tea (sugar ok, no milk/cream) Powdered fruit flavored drinks Coffee (sugar ok, no milk/cream) Gatorade Juice: apple, white grape, white cranberry  Lemonade Clear bullion, consomm, broth Carbonated beverages (any kind) Strained chicken noodle soup Hard Candy                             4.  In the morning, mix first dose of MoviPrep solution:    Empty 1 Pouch A and 1 Pouch B into the disposable container    Add lukewarm drinking water to the top line of the container. Mix to dissolve    Refrigerate (mixed solution should be used within 24 hrs)  5.  Begin drinking the prep at 5:00 p.m. The MoviPrep container is divided by 4 marks.   Every 15 minutes drink the solution down to the next mark (approximately 8 oz) until the full liter is complete.   6.  Follow completed prep with 16 oz of clear liquid of your choice (Nothing red or purple).   Continue to drink clear liquids until bedtime.  7.  Before going to bed, mix second dose of MoviPrep solution:    Empty 1 Pouch A and 1 Pouch B into the disposable container    Add lukewarm drinking water to the top line of the container. Mix to dissolve    Refrigerate  THE DAY OF YOUR PROCEDURE      DATE:  07/23/09  DAY: Tuesday  Beginning at  9:00 a.m. (5 hours before procedure):         1. Every 15 minutes, drink the solution down to the next mark (approx 8 oz) until the full liter is complete.  2. Follow completed prep with 16 oz. of clear liquid of your choice.    3. You may drink clear liquids until  12:00 noon (2 HOURS BEFORE PROCEDURE).   MEDICATION INSTRUCTIONS  Unless otherwise instructed, you should take regular prescription medications with a small sip of water   as early as possible the morning of your procedure.         OTHER INSTRUCTIONS  You  will need a responsible adult at least 70 years of age to accompany you and drive you home.   This person must remain in the waiting room during your procedure.  Wear loose fitting clothing that is easily removed.  Leave jewelry and other valuables at home.  However, you may wish to bring a book to read or  an iPod/MP3 player to listen to music as you wait for your procedure to start.  Remove all body piercing jewelry and leave at home.  Total time from sign-in until discharge is approximately 2-3 hours.  You should go home directly after your procedure and rest.  You can resume normal activities the  day after your procedure.  The day of your procedure you should not:   Drive   Make legal decisions   Operate machinery   Drink alcohol   Return to work  You will receive specific instructions about eating, activities and medications before you leave.    The above instructions have been reviewed and explained to me by   Ulis Rias RN  July 16, 2009 2:29 PM     I fully understand and can verbalize  these instructions _____________________________ Date _________

## 2010-02-18 NOTE — Progress Notes (Signed)
Summary: Medication Problems  Phone Note Call from Patient Call back at Home Phone [phone]   Caller: Spouse Summary of Call: patients wife called and left voice message stating patient stopped taking the Losartan Potassium 2 days ago. Her message states since starting the medication patient has had headaches, neck pain and dizziness. She states they wanted to  make Dr Artist Pais aware that he had stopped taking the medication Initial call taken by: Glendell Docker CMA,  February 18, 2009 3:24 PM  Follow-up for Phone Call        have pt pick up sample of diovan 80 mg if available Follow-up by: D. Thomos Lemons DO,  February 18, 2009 5:18 PM  Additional Follow-up for Phone Call Additional follow up Details #1::        patients wife advised per Dr Artist Pais instructions. She was informed samples would be left at front desk for pick up Additional Follow-up by: Glendell Docker CMA,  February 18, 2009 5:25 PM    New/Updated Medications: DIOVAN 80 MG TABS (VALSARTAN) one by mouth once daily

## 2010-02-18 NOTE — Assessment & Plan Note (Signed)
Summary: 2 month follow up/mhf   Vital Signs:  Patient profile:   70 year old male Weight:      181 pounds BMI:     28.45 O2 Sat:      98 % on Room air Temp:     98.2 degrees F oral Pulse rate:   66 / minute Pulse rhythm:   regular Resp:     16 per minute BP sitting:   120 / 60  (right arm) Cuff size:   large  Vitals Entered By: Glendell Docker CMA (January 31, 2009 8:56 AM)  O2 Flow:  Room air  Primary Care Provider:  D. Thomos Lemons DO  CC:  2 Month follow up .  History of Present Illness: 2 Month Follow up disease management  c/o dry cough during the day and productive at night- worse at bedtime for the past 2 months (ongoing since last office visit)  no fever chills.  no sinus headache.  minimal sputum only in AM.  no shortness of breath  upper right shoulder pain-below the shoulder blade -had it evaluated in the past-but it is unresolved, worse when arm is moved back and forth- pains comes and goes.  he is concerned he may have "stone".  no urinary complaints.  pain unrelated to food  GERD - he tried zantac but stopped due to constipation.  he still has reflux symptoms.  reflux discomfort seems to aggravate BP readings.  Htn - SBP ranges from 130's to 150's.    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  Alcohol Use - no  Review of Systems       The patient complains of prolonged cough.  The  patient denies weight loss, weight gain, and dyspnea on exertion.         heartburn - esp at night  Physical Exam  General:  alert, well-developed, and well-nourished.   Head:  normocephalic and atraumatic.   Ears:  R ear normal and L ear normal.   Mouth:  pharynx pink and moist.  mild post nasal gtt Lungs:  normal respiratory effort and normal breath sounds.   Heart:  normal rate, regular rhythm, and no gallop.   Msk:  slight pain with thoracic side bending and rotation.   T7-T9 rotated right tight left hamstring Neurologic:  cranial nerves II-XII intact and gait normal.     Impression & Recommendations:  Problem # 1:  COUGH (ICD-786.2) Assessment Unchanged 70 y/o [place] male with chronic dry cough.  ACE is probable trigger (lisinopril).  switch to ARB. AM cough may be from post nasal gtt - use nasal saline irrigation  Orders: T-2 View CXR, Same Day (71020.5TC)  Problem # 2:  BACK PAIN, THORACIC REGION, RIGHT (ICD-724.1)  Pt with chronic right thoracic pain.  worse with rotation and sidebending suggest of MSK etiology.   he tried antiinflammatories with little improvement.  utilized myofacscial techniques and HVLA to thoracic spine.  no complications.  Orders: OMT 1-2 Body Regions [phone])  Problem # 3:  HYPERTENSION (ICD-401.9) home readings -  SBP 120's to 150's.  lisinopril causes cough.  change to ARB  The following medications were removed from the medication list:    Lisinopril 10 Mg Tabs (Lisinopril) ..... One by mouth once daily His updated medication list for this problem includes:    Metoprolol Succinate 100 Mg Xr24h-tab (Metoprolol succinate) ..... One by mouth once daily    Losartan Potassium 50 Mg Tabs (Losartan potassium) ..... One by mouth once daily  BP today: 120/60 Prior BP: 116/72 (12/05/2008)  Labs Reviewed: K+: 4.6 (11/22/2008) Creat: : 1.24 (11/22/2008)     Problem # 4:  GERD (ICD-530.81) He could not transition to otc zantac.  zantac caused  constipation.  resume omeprazole. The following medications were removed from the medication list:    Omeprazole 20 Mg Tbec (Omeprazole) .Marland Kitchen... Take 1 tablet by mouth two times a day His updated medication list for this problem includes:    Omeprazole 40 Mg Cpdr (Omeprazole) ..... One by mouth once daily  Complete Medication List: 1)  Metoprolol Succinate 100 Mg Xr24h-tab (Metoprolol succinate) .... One by mouth once daily 2)  Lipitor 40 Mg Tabs (Atorvastatin calcium) .... One by mouth once daily 3)  Losartan Potassium 50 Mg Tabs (Losartan potassium) .... One by mouth once daily 4)  Omeprazole 40 Mg Cpdr (Omeprazole) .... One by mouth once daily  Patient Instructions: 1)  Apply Ayr gel to the inside of your nose at bedtime daily. 2)  Use saline nasal spray twice daily. 3)  Please schedule a follow-up appointment in 2 months. Prescriptions: OMEPRAZOLE 40 MG CPDR (OMEPRAZOLE) one by mouth once daily  #90 x 1   Entered and Authorized by:   D. Thomos Lemons DO   Signed by:   D. Thomos Lemons DO on 01/31/2009   Method used:   Electronically to        [facility] (retail)       [address] [place], [place]  [address]       Ph: [phone]       Fax: [phone]   RxID:   [id number] LOSARTAN POTASSIUM 50 MG TABS (LOSARTAN POTASSIUM) one by mouth once daily  #30 x 2   Entered and Authorized by:   D. Thomos Lemons DO   Signed by:   D. Thomos Lemons DO on 01/31/2009   Method used:   Electronically to        [facility] (retail)       [address] [place], [place]  [address]       Ph: [phone]       Fax: [phone]   RxID:   [id number]

## 2010-02-18 NOTE — Consult Note (Signed)
Summary: [facility]   Imported By: Lanelle Bal 07/17/2009 07:57:34  _____________________________________________________________________  External Attachment:    Type:   Image     Comment:   External Document

## 2010-02-18 NOTE — Progress Notes (Signed)
  Phone Note Outgoing Call   Summary of Call: pt reports higher BP at home.  he has been taking 50 mg of toprol xl with some improvement but bp still too high. occ feels lightheaded add back ARB  Marge - plz schedule f/u visit in 1 month.  BMET should be completed 2-3 days before OV Initial call taken by: D. Thomos Lemons DO,  October 07, 2009 8:34 AM  Follow-up for Phone Call        appt set Roselle Locus  October 07, 2009 9:17 AM    New/Updated Medications: METOPROLOL SUCCINATE 50 MG XR24H-TAB (METOPROLOL SUCCINATE) one by mouth once daily DIOVAN 160 MG TABS (VALSARTAN) one by mouth once daily Prescriptions: METOPROLOL SUCCINATE 50 MG XR24H-TAB (METOPROLOL SUCCINATE) one by mouth once daily  #90 x 1   Entered and Authorized by:   D. Thomos Lemons DO   Signed by:   D. Thomos Lemons DO on 10/07/2009   Method used:   Faxed to ...       [facility] (mail-order)       [address]       [place], [place]  [id number]       Ph: [phone]       Fax: [phone]   RxID:   [id number]

## 2010-03-12 ENCOUNTER — Encounter: Payer: Self-pay | Admitting: Internal Medicine

## 2010-03-12 NOTE — Assessment & Plan Note (Signed)
Summary: not urinating much/mhf   Vital Signs:  Patient profile:   70 year old male Height:      67 inches Weight:      177.75 pounds BMI:     27.94 O2 Sat:      97 % on Room air Temp:     97.8 degrees F oral Pulse rate:   76 / minute Resp:     16 per minute BP sitting:   108 / 60  (left arm) Cuff size:   large  Vitals Entered By: Glendell Docker CMA (February 17, 2010 2:37 PM)  O2 Flow:  Room air CC: Decrease in urination Is Patient Diabetic? No Pain Assessment Patient in pain? yes     Location: knee Intensity: 7 Type: aching Onset of pain  With activity   Primary Care Provider:  Dondra Spry DO  CC:  Decrease in urination.  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    Alcohol Use - no        Physical Exam  General:  alert, well-developed, and well-nourished.   Lungs:  normal respiratory effort and normal breath sounds.   Heart:  normal rate, regular rhythm, and no gallop.   Abdomen:  soft, non-tender, normal bowel sounds, and no masses.   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 One by mouth once daily    Finasteride 5 Mg Tabs (Finasteride) ..... One by mouth once daily  Problem # 2:  HYPERTENSION (ICD-401.9) Assessment: Improved  His updated medication list for this problem includes:    Metoprolol Succinate 50 Mg Xr24h-tab (Metoprolol succinate) ..... One by mouth once daily    Diovan 40 Mg Tabs (Valsartan) .Marland Kitchen... 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 One by mouth daily  Complete Medication List: 1)  Lipitor 40 Mg Tabs (Atorvastatin calcium) .... 1/2 by mouth qd 2)  Metoprolol Succinate 50 Mg Xr24h-tab (Metoprolol succinate) .... One by  mouth once daily 3)  Aspirin 81 Mg Chew (Aspirin) .... One by mouth daily 4)  Diovan 40 Mg Tabs (Valsartan) .... Take 1 tablet by mouth once a day 5)  Alfuzosin Hcl 10 Mg Xr24h-tab (Alfuzosin hcl) .... One by mouth once daily 6)  Finasteride 5 Mg Tabs (Finasteride) .... One by mouth once daily  Patient Instructions: 1)  Please schedule a follow-up appointment in 3 months. 2)  BMP prior to visit, ICD-9: 401.9 3)  PSA prior to visit, ICD-9: 600.01 4)  HgA1c:  790.29 5)  Please return for lab work one (1) week before your next appointment.  Prescriptions: FINASTERIDE 5 MG TABS (FINASTERIDE) one by mouth once daily  #30 x 3   Entered and Authorized by:   D. Thomos Lemons DO   Signed by:   D. Thomos Lemons DO on 02/17/2010   Method used:   Electronically to         [id number] (retail)       [address] [place], [place]  [address]       Ph: [phone]       Fax: [phone]   RxID:   [id number] ALFUZOSIN HCL 10 MG XR24H-TAB (ALFUZOSIN HCL) one by mouth once daily  #30 x 3   Entered and Authorized by:   D. Thomos Lemons DO   Signed by:   D. Thomos Lemons DO on 02/17/2010   Method used:   Electronically to        [facility] (retail)       [address] [place], [place]  [address]       Ph: [phone]       Fax: [phone]   RxID:   [id number]    Orders Added: 1)  Est. Patient Level III [93235]     Current Allergies (reviewed today): LISINOPRIL (LISINOPRIL)

## 2010-03-25 ENCOUNTER — Ambulatory Visit: Payer: Self-pay | Admitting: Internal Medicine

## 2010-04-05 LAB — DIFFERENTIAL
Eosinophils Absolute: 0.1 10*3/uL (ref 0.0–0.7)
Lymphs Abs: 1.2 10*3/uL (ref 0.7–4.0)
Monocytes Relative: 4 % (ref 3–12)
Neutro Abs: 8.7 10*3/uL — ABNORMAL HIGH (ref 1.7–7.7)
Neutrophils Relative %: 84 % — ABNORMAL HIGH (ref 43–77)

## 2010-04-05 LAB — COMPREHENSIVE METABOLIC PANEL
ALT: 20 U/L (ref 0–53)
Calcium: 9.7 mg/dL (ref 8.4–10.5)
Glucose, Bld: 154 mg/dL — ABNORMAL HIGH (ref 70–99)
Sodium: 135 mEq/L (ref 135–145)
Total Protein: 7.6 g/dL (ref 6.0–8.3)

## 2010-04-05 LAB — URINALYSIS, ROUTINE W REFLEX MICROSCOPIC
Glucose, UA: NEGATIVE mg/dL
pH: 5.5 (ref 5.0–8.0)

## 2010-04-05 LAB — CBC
HCT: 40.6 % (ref 39.0–52.0)
MCHC: 34.5 g/dL (ref 30.0–36.0)
Platelets: 436 10*3/uL — ABNORMAL HIGH (ref 150–400)
RDW: 12.7 % (ref 11.5–15.5)

## 2010-04-05 LAB — URINE CULTURE
Colony Count: NO GROWTH
Culture: NO GROWTH

## 2010-04-06 LAB — CBC
HCT: 43.8 % (ref 39.0–52.0)
HCT: 41.7 % (ref 39.0–52.0)
Hemoglobin: 14.9 g/dL (ref 13.0–17.0)
Hemoglobin: 14.4 g/dL (ref 13.0–17.0)
MCH: 32.2 pg (ref 26.0–34.0)
MCHC: 34 g/dL (ref 30.0–36.0)
MCV: 94.9 fL (ref 78.0–100.0)
Platelets: 184 10*3/uL (ref 150–400)
Platelets: 233 10*3/uL (ref 150–400)
Platelets: 190 10*3/uL (ref 150–400)
RBC: 4.62 MIL/uL (ref 4.22–5.81)
RBC: 3.7 MIL/uL — ABNORMAL LOW (ref 4.22–5.81)
RDW: 12.2 % (ref 11.5–15.5)
RDW: 12.9 % (ref 11.5–15.5)
RDW: 12.9 % (ref 11.5–15.5)
WBC: 6.1 10*3/uL (ref 4.0–10.5)
WBC: 7.7 10*3/uL (ref 4.0–10.5)
WBC: 8.4 10*3/uL (ref 4.0–10.5)
WBC: 5.6 10*3/uL (ref 4.0–10.5)

## 2010-04-06 LAB — DIFFERENTIAL
Basophils Absolute: 0.1 10*3/uL (ref 0.0–0.1)
Basophils Relative: 2 % — ABNORMAL HIGH (ref 0–1)
Eosinophils Absolute: 0.2 10*3/uL (ref 0.0–0.7)
Eosinophils Absolute: 0.3 10*3/uL (ref 0.0–0.7)
Eosinophils Relative: 3 % (ref 0–5)
Eosinophils Relative: 5 % (ref 0–5)
Lymphocytes Relative: 37 % (ref 12–46)
Lymphocytes Relative: 39 % (ref 12–46)
Lymphs Abs: 2.3 10*3/uL (ref 0.7–4.0)
Lymphs Abs: 2.2 10*3/uL (ref 0.7–4.0)
Monocytes Absolute: 0.5 K/uL (ref 0.1–1.0)
Monocytes Absolute: 0.5 10*3/uL (ref 0.1–1.0)
Monocytes Relative: 7 % (ref 3–12)
Neutro Abs: 3 K/uL (ref 1.7–7.7)
Neutrophils Relative %: 51 % (ref 43–77)

## 2010-04-06 LAB — COMPREHENSIVE METABOLIC PANEL
ALT: 26 U/L (ref 0–53)
ALT: 44 U/L (ref 0–53)
AST: 23 U/L (ref 0–37)
AST: 46 U/L — ABNORMAL HIGH (ref 0–37)
Albumin: 3.3 g/dL — ABNORMAL LOW (ref 3.5–5.2)
Albumin: 4 g/dL (ref 3.5–5.2)
Alkaline Phosphatase: 47 U/L (ref 39–117)
CO2: 26 mEq/L (ref 19–32)
Calcium: 9.5 mg/dL (ref 8.4–10.5)
Chloride: 108 mEq/L (ref 96–112)
GFR calc Af Amer: >60 mL/min (ref >60)
GFR calc non Af Amer: 60 mL/min — ABNORMAL LOW (ref >60)
Potassium: 4.2 mEq/L (ref 3.5–5.1)
Sodium: 141 mEq/L (ref 135–145)
Sodium: 141 mEq/L (ref 135–145)
Total Bilirubin: 1.3 mg/dL — ABNORMAL HIGH (ref 0.3–1.2)
Total Protein: 6.2 g/dL (ref 6.0–8.3)

## 2010-04-06 LAB — POCT CARDIAC MARKERS
CKMB, poc: 1.4 ng/mL (ref 1.0–8.0)
CKMB, poc: <1 ng/mL — ABNORMAL LOW (ref 1.0–8.0)
Myoglobin, poc: 44.6 ng/mL (ref 12–200)
Myoglobin, poc: 66.4 ng/mL (ref 12–200)
Troponin i, poc: <0.05 ng/mL (ref 0.00–0.09)
Troponin i, poc: <0.05 ng/mL (ref 0.00–0.09)

## 2010-04-06 LAB — BASIC METABOLIC PANEL
CO2: 22 mEq/L (ref 19–32)
Calcium: 8.9 mg/dL (ref 8.4–10.5)
Calcium: 8.2 mg/dL — ABNORMAL LOW (ref 8.4–10.5)
Chloride: 106 mEq/L (ref 96–112)
Creatinine, Ser: 1.2 mg/dL (ref 0.4–1.5)
Creatinine, Ser: 1.07 mg/dL (ref 0.4–1.5)
GFR calc Af Amer: >60 mL/min (ref >60)
GFR calc Af Amer: >60 mL/min (ref >60)
GFR calc non Af Amer: >60 mL/min (ref >60)

## 2010-04-06 LAB — CARDIAC PANEL(CRET KIN+CKTOT+MB+TROPI)
Relative Index: 0.9 (ref 0.0–2.5)
Relative Index: 1 (ref 0.0–2.5)
Total CK: 108 U/L (ref 7–232)
Total CK: 121 U/L (ref 7–232)
Troponin I: 0.01 ng/mL (ref 0.00–0.06)
Troponin I: 0.01 ng/mL (ref 0.00–0.06)

## 2010-04-06 LAB — CEA: CEA: 1.2 ng/mL (ref 0.0–5.0)

## 2010-04-06 LAB — PROTIME-INR
INR: 0.99 (ref 0.00–1.49)
Prothrombin Time: 13 seconds (ref 11.6–15.2)

## 2010-04-06 LAB — BASIC METABOLIC PANEL WITH GFR
BUN: 18 mg/dL (ref 6–23)
Chloride: 108 meq/L (ref 96–112)
GFR calc non Af Amer: >60 mL/min (ref >60)
Glucose, Bld: 183 mg/dL — ABNORMAL HIGH (ref 70–99)
Potassium: 4.1 meq/L (ref 3.5–5.1)
Sodium: 141 meq/L (ref 135–145)

## 2010-04-06 LAB — SURGICAL PCR SCREEN: Staphylococcus aureus: NEGATIVE

## 2010-04-06 LAB — MAGNESIUM: Magnesium: 2.1 mg/dL (ref 1.5–2.5)

## 2010-04-06 LAB — APTT: aPTT: 31 s (ref 24–37)

## 2010-04-22 ENCOUNTER — Encounter: Payer: Self-pay | Admitting: Internal Medicine

## 2010-04-28 ENCOUNTER — Ambulatory Visit: Payer: Self-pay | Admitting: Internal Medicine

## 2010-04-28 ENCOUNTER — Other Ambulatory Visit: Payer: Self-pay | Admitting: Internal Medicine

## 2010-04-28 DIAGNOSIS — R7309 Other abnormal glucose: Secondary | ICD-10-CM

## 2010-04-28 DIAGNOSIS — I10 Essential (primary) hypertension: Secondary | ICD-10-CM

## 2010-04-28 DIAGNOSIS — N401 Enlarged prostate with lower urinary tract symptoms: Secondary | ICD-10-CM

## 2010-04-29 LAB — BASIC METABOLIC PANEL
BUN: 19 mg/dL (ref 6–23)
Chloride: 105 mEq/L (ref 96–112)
Creat: 1.2 mg/dL (ref 0.40–1.50)
Glucose, Bld: 100 mg/dL — ABNORMAL HIGH (ref 70–99)
Potassium: 5 mEq/L (ref 3.5–5.3)

## 2010-04-29 LAB — HEMOGLOBIN A1C: Mean Plasma Glucose: 126 mg/dL — ABNORMAL HIGH (ref <117)

## 2010-05-05 ENCOUNTER — Ambulatory Visit: Payer: Self-pay | Admitting: Internal Medicine

## 2010-05-06 ENCOUNTER — Encounter: Payer: Self-pay | Admitting: Internal Medicine

## 2010-05-14 ENCOUNTER — Ambulatory Visit: Payer: Self-pay | Admitting: Internal Medicine

## 2010-05-16 ENCOUNTER — Encounter: Payer: Self-pay | Admitting: Internal Medicine

## 2010-05-16 ENCOUNTER — Ambulatory Visit (INDEPENDENT_AMBULATORY_CARE_PROVIDER_SITE_OTHER): Payer: Medicare Other | Admitting: Internal Medicine

## 2010-05-16 DIAGNOSIS — N401 Enlarged prostate with lower urinary tract symptoms: Secondary | ICD-10-CM

## 2010-05-16 DIAGNOSIS — I10 Essential (primary) hypertension: Secondary | ICD-10-CM

## 2010-05-16 DIAGNOSIS — E785 Hyperlipidemia, unspecified: Secondary | ICD-10-CM

## 2010-05-16 MED ORDER — FINASTERIDE 5 MG PO TABS: 5.0000 mg | ORAL_TABLET | Freq: Every day | ORAL | Status: DC

## 2010-05-16 MED ORDER — METOPROLOL SUCCINATE ER 50 MG PO TB24: 50.0000 mg | ORAL_TABLET | Freq: Every day | ORAL | Status: DC

## 2010-05-16 MED ORDER — VALSARTAN 40 MG PO TABS: 40.0000 mg | ORAL_TABLET | Freq: Every day | ORAL | Status: DC

## 2010-05-16 NOTE — Progress Notes (Signed)
  Subjective:    Patient ID: Brandon Bradford, male    DOB: [birth year], 70 y.o.   MRN: [id number]  HPI 70 y/o [place] male for follow up.  Pt could not tolerate alfuzosin.  Similar side effect as flomax.  He also stopped finasteride due to lack of significant change in urine flow.  He did not take medication for any extended time period.  Hx of right hemicoloectomy.   No abd pain.  Noticed increase in foul odor to BMs.  Hypertension - stable.  No dizziness   Review of Systems Negative for chest pain.  No palpitations.      Past Medical History  Diagnosis Date  . Hypertension   . Hyperlipidemia   . Gastritis   . Colitis, ischemic   . Vertigo, intermittent   . Abnormal glucose 07/22/09    A1c 6.1  . Paroxysmal atrial fibrillation   . AAA (abdominal aortic aneurysm)     3.4 cm    History   Social History  . Marital Status: Married    Spouse Name: N/A    Number of Children: 4  . Years of Education: N/A   Occupational History  . RETIRED    Social History Main Topics  . Smoking status: Former Games developer  . Smokeless tobacco: Not on file  . Alcohol Use: No  . Drug Use: Not on file  . Sexually Active: Not on file   Other Topics Concern  . Not on file   Social History Narrative  . No narrative on file    Past Surgical History  Procedure Date  . Appendectomy 1971  . Hemorroidectomy 2002    Family History  Problem Relation Age of Onset  . Hypertension      Allergies  Allergen Reactions  . Lisinopril     REACTION: cough    Current Outpatient Prescriptions on File Prior to Visit  Medication Sig Dispense Refill  . aspirin 81 MG chewable tablet Chew 81 mg by mouth daily.        Marland Kitchen atorvastatin (LIPITOR) 40 MG tablet 40 mg. Take 1/2 tablet by mouth daily.       Marland Kitchen alfuzosin (UROXATRAL) 10 MG 24 hr tablet Take 10 mg by mouth daily.          BP 120/70  Pulse 65  Temp(Src) 98.2 F (36.8 C) (Oral)  Resp 18  Wt 176 lb (79.833 kg)  SpO2 96%    Objective:   Physical  Exam     Constitutional: Appears well-developed and well-nourished. No distress.  Neck: Normal range of motion. Neck supple. No thyromegaly present.  No carotid bruit  Cardiovascular: Normal rate, regular rhythm and normal heart sounds.  Exam reveals no gallop and no friction rub.   No murmur heard. Pulmonary/Chest: Effort normal and breath sounds normal.  No wheezes. No rales.  Abdominal: Soft. Bowel sounds are normal. No mass. There is no tenderness.  Neurological: Alert. No cranial nerve deficit.  Skin: Skin is warm and dry.  Psychiatric: Normal mood and affect. Behavior is normal.    Assessment & Plan:

## 2010-05-16 NOTE — Patient Instructions (Signed)
Use metamucil once daily  Use align daily x 1 month Please complete the following lab tests before your next follow up appointment: BMET - 401.9 A1c - 790.29 FLP, LFTs - 272.4

## 2010-06-10 NOTE — Assessment & Plan Note (Signed)
Well controlled.  Continue current medication regimen.   BP: 120/70 mmHg  Lab Results  Component Value Date   CREATININE 1.20 04/28/2010

## 2010-06-10 NOTE — Assessment & Plan Note (Addendum)
Pt could not tolerate alfuzosin I suggest pt continue finasteride. Lab Results  Component Value Date   PSA 0.31 04/28/2010

## 2010-06-10 NOTE — Assessment & Plan Note (Signed)
Stable.  Arrange f/u FLP and LFTs before next OV

## 2010-08-14 ENCOUNTER — Ambulatory Visit (HOSPITAL_BASED_OUTPATIENT_CLINIC_OR_DEPARTMENT_OTHER)
Admission: RE | Admit: 2010-08-14 | Discharge: 2010-08-14 | Disposition: A | Discharge status: Home / Self Care | Payer: Medicare Other | Source: Ambulatory Visit | Attending: Internal Medicine | Admitting: Internal Medicine

## 2010-08-14 ENCOUNTER — Ambulatory Visit (INDEPENDENT_AMBULATORY_CARE_PROVIDER_SITE_OTHER): Payer: Medicare Other | Admitting: Internal Medicine

## 2010-08-14 ENCOUNTER — Encounter: Payer: Self-pay | Admitting: Internal Medicine

## 2010-08-14 VITALS — BP 108/64 | HR 78 | Temp 97.9°F | Resp 16 | Wt 182.1 lb

## 2010-08-14 DIAGNOSIS — I4891 Unspecified atrial fibrillation: Secondary | ICD-10-CM

## 2010-08-14 DIAGNOSIS — R7309 Other abnormal glucose: Secondary | ICD-10-CM

## 2010-08-14 DIAGNOSIS — Z09 Encounter for follow-up examination after completed treatment for conditions other than malignant neoplasm: Secondary | ICD-10-CM | POA: Insufficient documentation

## 2010-08-14 DIAGNOSIS — I714 Abdominal aortic aneurysm, without rupture, unspecified: Secondary | ICD-10-CM | POA: Insufficient documentation

## 2010-08-14 DIAGNOSIS — E785 Hyperlipidemia, unspecified: Secondary | ICD-10-CM

## 2010-08-14 DIAGNOSIS — M25569 Pain in unspecified knee: Secondary | ICD-10-CM

## 2010-08-14 DIAGNOSIS — I480 Paroxysmal atrial fibrillation: Secondary | ICD-10-CM

## 2010-08-14 MED ORDER — VALSARTAN 40 MG PO TABS: 40.0000 mg | ORAL_TABLET | Freq: Every day | ORAL | Status: DC

## 2010-08-14 NOTE — Assessment & Plan Note (Signed)
Asymptomatic. Schedule followup ultrasound for interval change

## 2010-08-14 NOTE — Patient Instructions (Signed)
Please schedule lipid/lft (272.4), cbc (401.9), chem7 (v58.69) prior to next visit

## 2010-08-14 NOTE — Assessment & Plan Note (Signed)
Asymptomatic. Cardiology referral.

## 2010-08-14 NOTE — Assessment & Plan Note (Signed)
Mild. Atraumatic. Recommend several days of OTC anti-inflammatory as needed with food. Followup if no improvement or worsening.

## 2010-08-14 NOTE — Assessment & Plan Note (Signed)
Recommend low sugar carbohydrate diet and regular exercise.

## 2010-08-14 NOTE — Assessment & Plan Note (Signed)
Stable. Obtain fasting lipid profile and liver function tests prior to next visit

## 2010-08-14 NOTE — Progress Notes (Signed)
  Subjective:    Patient ID: Brandon Bradford, male    DOB: Feb 08, 1940, 70 y.o.   MRN: [id number]  HPI Patient presents to clinic for evaluation of knee pain hypertension and followup of aortic aneurysm. Reports history of abdominal aortic aneurysm measured 3.7 cm in 2009. Asymptomatic without abdominal pain. Believes last evaluation over one year ago. Has history of mild glucose intolerance with last A1c reviewed 6.0. Pneumovax reviewed with patient's wife to be up-to-date. States 2 days ago took a misstep and now has mild bilateral knee pain left greater than right. No instability or falls. No exacerbating or alleviating factors. Taking no medication for this. Has history of paroxysm atrial fibrillation with no chest pain shortness of breath or palpitations. Has been seeing outside cardiologist and requests to see Dr. Jens Som. Also notes intermittent atypical left sided chest pain described as sharp lasting several seconds. Spontaneously resolves or resolves after rubbing the area or putting pressure on the area. No other complaints.  Reviewed past medical history, medications and allergies  Review of Systems see history of present illness     Objective:   Physical Exam  Physical Exam  Vitals reviewed. Constitutional:  appears well-developed and well-nourished. No distress.  HENT:  Head: Normocephalic and atraumatic.  Nose: Nose normal.  Eyes: Conjunctivae and EOM are normal. Pupils are equal, round, and reactive to light. Right eye exhibits no discharge. Left eye exhibits no discharge. No scleral icterus.  Neck: Neck supple. No thyromegaly present. No carotid bruits Cardiovascular: Normal rate, regular rhythm and normal heart sounds.  Exam reveals no gallop and no friction rub.   No murmur heard. Pulmonary/Chest: Effort normal and breath sounds normal. No respiratory distress.  has no wheezes.  has no rales.  Lymphadenopathy:   no cervical adenopathy.  Neurological:  is alert.  Skin: Skin is  warm and dry.  not diaphoretic.  Psychiatric: normal mood and affect.        Assessment & Plan:   No problem-specific assessment & plan notes found for this encounter.

## 2010-08-27 ENCOUNTER — Telehealth: Payer: Self-pay | Admitting: Internal Medicine

## 2010-08-27 ENCOUNTER — Ambulatory Visit (INDEPENDENT_AMBULATORY_CARE_PROVIDER_SITE_OTHER): Payer: Medicare Other | Admitting: Cardiology

## 2010-08-27 ENCOUNTER — Encounter: Payer: Self-pay | Admitting: Cardiology

## 2010-08-27 ENCOUNTER — Encounter: Payer: Self-pay | Admitting: *Deleted

## 2010-08-27 DIAGNOSIS — R072 Precordial pain: Secondary | ICD-10-CM

## 2010-08-27 DIAGNOSIS — R079 Chest pain, unspecified: Secondary | ICD-10-CM | POA: Insufficient documentation

## 2010-08-27 DIAGNOSIS — I10 Essential (primary) hypertension: Secondary | ICD-10-CM

## 2010-08-27 DIAGNOSIS — I4891 Unspecified atrial fibrillation: Secondary | ICD-10-CM

## 2010-08-27 DIAGNOSIS — I714 Abdominal aortic aneurysm, without rupture: Secondary | ICD-10-CM

## 2010-08-27 DIAGNOSIS — E785 Hyperlipidemia, unspecified: Secondary | ICD-10-CM

## 2010-08-27 NOTE — Assessment & Plan Note (Signed)
Symptoms not consistent with cardiac etiology. Electrocardiogram normal. Previous Myoview negative. No further ischemia evaluation.

## 2010-08-27 NOTE — Assessment & Plan Note (Signed)
Patient will need followup studies. This is being managed by primary care.

## 2010-08-27 NOTE — Assessment & Plan Note (Signed)
Management per primary care. 

## 2010-08-27 NOTE — Assessment & Plan Note (Signed)
No recurrences in the past year. Continue beta blocker. Chads score 1 for hypertension. Add aspirin 81 mg daily. Continue beta blocker.

## 2010-08-27 NOTE — Telephone Encounter (Signed)
This encounter was created in error - please disregard.

## 2010-08-27 NOTE — Telephone Encounter (Signed)
Patient would like you to mail a printed copy of his lab results for the most recent lab

## 2010-08-27 NOTE — Patient Instructions (Signed)
Your physician wants you to follow-up in: one year  You will receive a reminder letter in the mail two months in advance. If you don't receive a letter, please call our office to schedule the follow-up appointment.   START ASPIRIN 81 MG ONCE DAILY

## 2010-08-27 NOTE — Assessment & Plan Note (Signed)
Her blood pressure controlled. Continue present medications. 

## 2010-08-27 NOTE — Progress Notes (Signed)
HPI: 70 year old male for fu of atrial fibrillation. Patient was admitted to [facility] in July of 2011 with atrial fibrillation and converted with flecainide. Echocardiogram in October of 2010 showed normal LV function. Myoview in July of 2011 showed an ejection fraction of 70% and normal perfusion. Abdominal ultrasound July 2012 revealed a 3.2 x 3.3 cm aneurysm. Last saw Dr Johney Frame in August of 2011. Since then, he does not have dyspnea on exertion, orthopnea, PND, pedal edema, syncope or recurrent palpitations. He does describe occasional pain in various locations on his chest. They last seconds and improved with massage. No associated symptoms. He walks 2-4 miles daily with no symptoms.  Current Outpatient Prescriptions  Medication Sig Dispense Refill  . atorvastatin (LIPITOR) 40 MG tablet 40 mg. Take 1/2 tablet by mouth daily.       . metoprolol (TOPROL-XL) 50 MG 24 hr tablet Take 1 tablet (50 mg total) by mouth daily.  90 tablet  1  . valsartan (DIOVAN) 40 MG tablet Take 1 tablet (40 mg total) by mouth daily.  90 tablet  1  . aspirin EC 81 MG tablet Take 1 tablet (81 mg total) by mouth daily.  150 tablet  2    Allergies  Allergen Reactions  . Lisinopril     REACTION: cough    Past Medical History  Diagnosis Date  . Hypertension   . Hyperlipidemia   . Gastritis   . Colitis, ischemic   . Vertigo, intermittent   . Abnormal glucose 07/22/09    A1c 6.1  . Paroxysmal atrial fibrillation   . AAA (abdominal aortic aneurysm)     3.4 cm    Past Surgical History  Procedure Date  . Appendectomy 1971  . Hemorroidectomy 2002    History   Social History  . Marital Status: Married    Spouse Name: N/A    Number of Children: 4  . Years of Education: N/A   Occupational History  . RETIRED    Social History Main Topics  . Smoking status: Former Games developer  . Smokeless tobacco: Not on file  . Alcohol Use: No  . Drug Use: Not on file  . Sexually Active: Not on file   Other  Topics Concern  . Not on file   Social History Narrative  . No narrative on file    Family History  Problem Relation Age of Onset  . Hypertension      ROS: no fevers or chills, productive cough, hemoptysis, dysphasia, odynophagia, melena, hematochezia, dysuria, hematuria, rash, seizure activity, orthopnea, PND, pedal edema, claudication. Remaining systems are negative.  Physical Exam: General:  Well developed/well nourished in NAD Skin warm/dry Patient not depressed No peripheral clubbing Back-normal HEENT-normal/normal eyelids Neck supple/normal carotid upstroke bilaterally; no bruits; no JVD; no thyromegaly chest - CTA/ normal expansion CV - RRR/normal S1 and S2; no murmurs, rubs or gallops;  PMI nondisplaced Abdomen -NT/ND, no HSM, no mass, + bowel sounds, no bruit 2+ femoral pulses, no bruits Ext-no edema, chords, 2+ DP Neuro-grossly nonfocal  ECG normal sinus rhythm at a rate of 65. Left axis deviation. No ST changes.

## 2010-08-27 NOTE — Telephone Encounter (Signed)
Lab results form 04/28/2010 mailed to patients address on file

## 2010-09-03 ENCOUNTER — Other Ambulatory Visit: Payer: Self-pay | Admitting: Internal Medicine

## 2010-09-03 DIAGNOSIS — I10 Essential (primary) hypertension: Secondary | ICD-10-CM

## 2010-09-03 DIAGNOSIS — D649 Anemia, unspecified: Secondary | ICD-10-CM

## 2010-10-21 ENCOUNTER — Other Ambulatory Visit: Payer: Self-pay | Admitting: Internal Medicine

## 2010-10-21 DIAGNOSIS — D649 Anemia, unspecified: Secondary | ICD-10-CM

## 2010-10-21 DIAGNOSIS — I10 Essential (primary) hypertension: Secondary | ICD-10-CM

## 2010-10-21 LAB — CBC
Platelets: 248 10*3/uL (ref 150–400)
RBC: 4.75 MIL/uL (ref 4.22–5.81)
RDW: 12.5 % (ref 11.5–15.5)
WBC: 6.6 10*3/uL (ref 4.0–10.5)

## 2010-10-21 LAB — BASIC METABOLIC PANEL
BUN: 22 mg/dL (ref 6–23)
Calcium: 9.8 mg/dL (ref 8.4–10.5)
Creat: 1.23 mg/dL (ref 0.50–1.35)
Glucose, Bld: 96 mg/dL (ref 70–99)

## 2010-10-29 ENCOUNTER — Ambulatory Visit: Payer: Medicare Other | Admitting: Internal Medicine

## 2010-10-29 ENCOUNTER — Ambulatory Visit (INDEPENDENT_AMBULATORY_CARE_PROVIDER_SITE_OTHER): Payer: Medicare Other | Admitting: Family

## 2010-10-29 DIAGNOSIS — Z23 Encounter for immunization: Secondary | ICD-10-CM

## 2010-11-05 ENCOUNTER — Ambulatory Visit: Payer: Medicare Other | Admitting: Internal Medicine

## 2010-11-10 ENCOUNTER — Encounter: Payer: Self-pay | Admitting: Internal Medicine

## 2010-11-10 ENCOUNTER — Ambulatory Visit (INDEPENDENT_AMBULATORY_CARE_PROVIDER_SITE_OTHER): Payer: Medicare Other | Admitting: Internal Medicine

## 2010-11-10 DIAGNOSIS — I10 Essential (primary) hypertension: Secondary | ICD-10-CM

## 2010-11-10 DIAGNOSIS — N529 Male erectile dysfunction, unspecified: Secondary | ICD-10-CM | POA: Insufficient documentation

## 2010-11-10 MED ORDER — VALSARTAN 80 MG PO TABS: 80.0000 mg | ORAL_TABLET | Freq: Every day | ORAL | Status: DC

## 2010-11-10 MED ORDER — VALSARTAN 40 MG PO TABS: 40.0000 mg | ORAL_TABLET | Freq: Every day | ORAL | Status: DC

## 2010-11-10 MED ORDER — METOPROLOL SUCCINATE ER 50 MG PO TB24: 50.0000 mg | ORAL_TABLET | Freq: Every day | ORAL | Status: DC

## 2010-11-10 MED ORDER — VARDENAFIL HCL 10 MG PO TABS: 10.0000 mg | ORAL_TABLET | ORAL | Status: DC | PRN

## 2010-11-10 MED ORDER — ATORVASTATIN CALCIUM 40 MG PO TABS: 40.0000 mg | ORAL_TABLET | Freq: Every day | ORAL | Status: DC

## 2010-11-10 NOTE — Patient Instructions (Signed)
Please complete the following lab tests before your next follow up appointment: bmet - 401.9 Lipid panel, LFTs - 272.4

## 2010-11-10 NOTE — Progress Notes (Signed)
  Subjective:    Patient ID: Brandon Bradford, male    DOB: Oct 08, 1940, 70 y.o.   MRN: [id number]  Hypertension This is a chronic problem. The current episode started more than 1 year ago. The problem is unchanged. The problem is controlled. Pertinent negatives include no chest pain or shortness of breath. There are no associated agents to hypertension. Risk factors for coronary artery disease include male gender. Past treatments include beta blockers and angiotensin blockers. The current treatment provides moderate improvement. There are no compliance problems.    He complains of mild ED symptoms.    Review of Systems  Respiratory: Negative for shortness of breath.   Cardiovascular: Negative for chest pain.   Past Medical History  Diagnosis Date  . Hypertension   . Hyperlipidemia   . Gastritis   . Colitis, ischemic   . Vertigo, intermittent   . Abnormal glucose 07/22/09    A1c 6.1  . Paroxysmal atrial fibrillation   . AAA (abdominal aortic aneurysm)     3.4 cm    History   Social History  . Marital Status: Married    Spouse Name: N/A    Number of Children: 4  . Years of Education: N/A   Occupational History  . RETIRED    Social History Main Topics  . Smoking status: Former Games developer  . Smokeless tobacco: Not on file  . Alcohol Use: No  . Drug Use: Not on file  . Sexually Active: Not on file   Other Topics Concern  . Not on file   Social History Narrative  . No narrative on file    Past Surgical History  Procedure Date  . Appendectomy 1971  . Hemorroidectomy 2002    Family History  Problem Relation Age of Onset  . Hypertension      Allergies  Allergen Reactions  . Lisinopril     REACTION: cough    Current Outpatient Prescriptions on File Prior to Visit  Medication Sig Dispense Refill  . aspirin EC 81 MG tablet Take 1 tablet (81 mg total) by mouth daily.  150 tablet  2    BP 118/74  Pulse 67  Temp(Src) 98.1 F (36.7 C) (Oral)  Wt 183 lb (83.008  kg)        Objective:   Physical Exam   Constitutional: Appears well-developed and well-nourished. No distress.  Head: Normocephalic and atraumatic.  Eyes: Conjunctivae are normal. Pupils are equal, round, and reactive to light.  Neck: Normal range of motion. Neck supple. No thyromegaly present. No carotid bruit Cardiovascular: Normal rate, regular rhythm and normal heart sounds.  Exam reveals no gallop and no friction rub.   No murmur heard. Pulmonary/Chest: Effort normal and breath sounds normal.  No wheezes. No rales.  Abdominal: Soft. Bowel sounds are normal. No mass. There is no tenderness.  Neurological: Alert. No cranial nerve deficit.  Skin: Skin is warm and dry.  Psychiatric: Normal mood and affect. Behavior is normal.      Assessment & Plan:

## 2010-11-10 NOTE — Assessment & Plan Note (Addendum)
Pt complains of mild ED.  Trial of levitra 10 mg.  Samples provided.  Common side effects discussed.

## 2010-11-10 NOTE — Assessment & Plan Note (Signed)
Well controlled. Continue current medication regimen.  

## 2010-11-14 ENCOUNTER — Ambulatory Visit: Payer: Medicare Other | Admitting: Internal Medicine

## 2011-02-09 ENCOUNTER — Ambulatory Visit (INDEPENDENT_AMBULATORY_CARE_PROVIDER_SITE_OTHER): Payer: Medicare Other | Admitting: Internal Medicine

## 2011-02-09 VITALS — BP 114/68 | Temp 98.0°F | Wt 188.0 lb

## 2011-02-09 DIAGNOSIS — M546 Pain in thoracic spine: Secondary | ICD-10-CM | POA: Diagnosis not present

## 2011-02-09 DIAGNOSIS — D126 Benign neoplasm of colon, unspecified: Secondary | ICD-10-CM | POA: Diagnosis not present

## 2011-02-09 MED ORDER — CYCLOBENZAPRINE HCL 5 MG PO TABS: 5.0000 mg | ORAL_TABLET | Freq: Every evening | ORAL | Status: AC | PRN

## 2011-02-09 NOTE — Assessment & Plan Note (Signed)
71 year old [place] male with chronic thoracic back pain. His symptoms likely from musculoskeletal cause however nocturnal symptoms are concerning. Start with x-ray of thoracic spine. Trial of muscle relaxer. We discussed obtaining MRI of thoracic spine if persistent symptoms.

## 2011-02-09 NOTE — Patient Instructions (Signed)
Our office will contact you re:  X ray results Take cyclobenzaprine 5 mg at bedtime as directed Use warm compress 2 x daily Avoid any strenuous activity x 2 weeks

## 2011-02-09 NOTE — Progress Notes (Signed)
  Subjective:    Patient ID: Brandon Bradford, male    DOB: [birth year], 71 y.o.   MRN: [id number]  HPI  71 year old [place] male complains of chronic right-sided thoracic pain. His symptoms have been ongoing on and off for the last 3 years. He reports his symptoms have been worse over the last several months.  He describes in nagging aching sensation.  He has nocturnal symptoms that awaken him.  He thought his symptoms might be related to his golfing activities so he stopped for several weeks. Patient reports symptoms actually got worse with inactivity.  Hx of large tubulovillous adenoma of cecum. He is status post right hemicolectomy. He is due for followup colonoscopy.  He is asymptomatic.  Review of Systems    negative for history of injury,  No radiation of pain  Past Medical History  Diagnosis Date  . Hypertension   . Hyperlipidemia   . Gastritis   . Colitis, ischemic   . Vertigo, intermittent   . Abnormal glucose 07/22/09    A1c 6.1  . Paroxysmal atrial fibrillation   . AAA (abdominal aortic aneurysm)     3.4 cm    History   Social History  . Marital Status: Married    Spouse Name: N/A    Number of Children: 4  . Years of Education: N/A   Occupational History  . RETIRED    Social History Main Topics  . Smoking status: Former Games developer  . Smokeless tobacco: Not on file  . Alcohol Use: No  . Drug Use: Not on file  . Sexually Active: Not on file   Other Topics Concern  . Not on file   Social History Narrative  . No narrative on file    Past Surgical History  Procedure Date  . Appendectomy 1971  . Hemorroidectomy 2002    Family History  Problem Relation Age of Onset  . Hypertension      Allergies  Allergen Reactions  . Lisinopril     REACTION: cough    Current Outpatient Prescriptions on File Prior to Visit  Medication Sig Dispense Refill  . aspirin EC 81 MG tablet Take 1 tablet (81 mg total) by mouth daily.  150 tablet  2  . atorvastatin (LIPITOR) 40 MG  tablet Take 1 tablet (40 mg total) by mouth daily.  90 tablet  1  . metoprolol (TOPROL-XL) 50 MG 24 hr tablet Take 1 tablet (50 mg total) by mouth daily.  90 tablet  1  . valsartan (DIOVAN) 80 MG tablet Take 1 tablet (80 mg total) by mouth daily.  90 tablet  1  . vardenafil (LEVITRA) 10 MG tablet Take 1 tablet (10 mg total) by mouth as needed for erectile dysfunction.  20 tablet  0    BP 114/68  Temp(Src) 98 F (36.7 C) (Oral)  Wt 188 lb (85.276 kg)    Objective:   Physical Exam  Constitutional: He appears well-developed and well-nourished.  Cardiovascular: Normal rate, regular rhythm and normal heart sounds.   Pulmonary/Chest: Effort normal and breath sounds normal. He has no wheezes. He has no rales.  Musculoskeletal:       Mild tenderness near right scapula (rhomboid muscle).  Pain with left rotation and left side bending      Assessment & Plan:

## 2011-02-09 NOTE — Assessment & Plan Note (Signed)
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  He is status post right hemicolectomy approximately 2 yrs ago. Refer to GI for followup colonoscopy.

## 2011-02-10 ENCOUNTER — Ambulatory Visit (HOSPITAL_BASED_OUTPATIENT_CLINIC_OR_DEPARTMENT_OTHER)
Admission: RE | Admit: 2011-02-10 | Discharge: 2011-02-10 | Disposition: A | Discharge status: Home / Self Care | Payer: Medicare PPO | Source: Ambulatory Visit | Attending: Internal Medicine | Admitting: Internal Medicine

## 2011-02-10 DIAGNOSIS — M4120 Other idiopathic scoliosis, site unspecified: Secondary | ICD-10-CM | POA: Diagnosis not present

## 2011-02-10 DIAGNOSIS — M546 Pain in thoracic spine: Secondary | ICD-10-CM | POA: Insufficient documentation

## 2011-02-10 DIAGNOSIS — M549 Dorsalgia, unspecified: Secondary | ICD-10-CM

## 2011-02-10 DIAGNOSIS — IMO0002 Reserved for concepts with insufficient information to code with codable children: Secondary | ICD-10-CM | POA: Diagnosis not present

## 2011-02-16 ENCOUNTER — Other Ambulatory Visit: Payer: Self-pay | Admitting: Internal Medicine

## 2011-02-16 DIAGNOSIS — IMO0002 Reserved for concepts with insufficient information to code with codable children: Secondary | ICD-10-CM

## 2011-02-27 ENCOUNTER — Encounter: Payer: Self-pay | Admitting: Gastroenterology

## 2011-03-16 ENCOUNTER — Encounter: Payer: Self-pay | Admitting: Gastroenterology

## 2011-03-16 ENCOUNTER — Ambulatory Visit (INDEPENDENT_AMBULATORY_CARE_PROVIDER_SITE_OTHER): Payer: Medicare PPO | Admitting: Gastroenterology

## 2011-03-16 VITALS — BP 122/64 | HR 80 | Ht 67.0 in | Wt 185.0 lb

## 2011-03-16 DIAGNOSIS — Z860100 Personal history of colon polyps, unspecified: Secondary | ICD-10-CM

## 2011-03-16 DIAGNOSIS — R1013 Epigastric pain: Secondary | ICD-10-CM

## 2011-03-16 DIAGNOSIS — K3189 Other diseases of stomach and duodenum: Secondary | ICD-10-CM

## 2011-03-16 NOTE — Patient Instructions (Addendum)
Recall colonoscopy for July 2014 for personal history of precancerous polyps. We will get records from Dr. Loman Chroman regarding your EGD from 2011 and will get in touch with you after that.

## 2011-03-16 NOTE — Progress Notes (Signed)
Review of pertinent gastrointestinal problems: 1. adenomatous colon polyps:  Initial colonoscopy in May 2011 by Dr. Loman Chroman where he found 2 polyps that appeared unresectable to him, one was in the cecum and one was at the hepatic flexure. Repeat colonoscopy by Dr. Christella Hartigan July 2011 found the same and recommended that she undergo the right hemicolectomy that was recommended to her previously. She underwent that surgery August 2011 and on past college he is a 5.1 cm cecal tubulovillous adenoma as well as a 1.4 cm hepatic flexure adenoma were removed in the right hemicolectomy specimen. Several nodes were removed none with cancer or neoplasia.   He had post operative problems treated at [facility], sounds like SBO.  HPI: This is a  very pleasant 71 year old man whom I last saw about 2 years ago. He is here with his wife today.  They described a bad experience they had with the emergency room at [facility] and also at [facility] shortly after his right hemicolectomy. It sounds like he presented with a bowel obstructive symptoms. X-ray done in the emergency room also suggest small bowel obstruction, he was told he was fine however and was sent home. He eventually went to [facility] and they treated him conservatively with an NG tube for small bowel obstruction.  He felt he was in need of another colonoscopy around now.  He takes pancreatic enzyme for dyspepsia.  Takes creon and that helps the dyspepsia.    He had an EGD in 2011 that showed "some kind of ulcer" he was put on proton pump inhibitor and he has been on that for a year and a half or so now. He does not think that ever helped his mild dyspeptic symptoms.  PPI did not help.  Past Medical History  Diagnosis Date  . Hypertension   . Hyperlipidemia   . Gastritis   . Colitis, ischemic   . Vertigo, intermittent   . Abnormal glucose 07/22/09    A1c 6.1  . Paroxysmal atrial fibrillation   . AAA (abdominal aortic aneurysm)     3.4 cm    . GERD (gastroesophageal reflux disease)   . Hx of colonic polyps     Past Surgical History  Procedure Date  . Appendectomy 1971  . Hemorroidectomy 2002  . Hemicolectomy     Right    Current Outpatient Prescriptions  Medication Sig Dispense Refill  . atorvastatin (LIPITOR) 40 MG tablet Take 1 tablet (40 mg total) by mouth daily.  90 tablet  1  . metoprolol (TOPROL-XL) 50 MG 24 hr tablet Take 1 tablet (50 mg total) by mouth daily.  90 tablet  1  . omeprazole (PRILOSEC) 40 MG capsule Take 40 mg by mouth as needed.      . valsartan (DIOVAN) 80 MG tablet Take 40 mg by mouth daily.        Allergies as of 03/16/2011 - Review Complete 03/16/2011  Allergen Reaction Noted  . Lisinopril  01/31/2009    Family History  Problem Relation Age of Onset  . Colon cancer Neg Hx     History   Social History  . Marital Status: Married    Spouse Name: N/A    Number of Children: 4  . Years of Education: N/A   Occupational History  . RETIRED    Social History Main Topics  . Smoking status: Former Games developer  . Smokeless tobacco: Never Used  . Alcohol Use: No  . Drug Use: No  . Sexually Active:  Not on file   Other Topics Concern  . Not on file   Social History Narrative  . No narrative on file      Physical Exam: BP 122/64  Pulse 80  Ht 5\' 7"  (1.702 m)  Wt 185 lb (83.915 kg)  BMI 28.97 kg/m2 Constitutional: generally well-appearing Psychiatric: alert and oriented x3 Abdomen: soft, nontender, nondistended, no obvious ascites, no peritoneal signs, normal bowel sounds     Assessment and plan: 71 y.o. male with personal history of adenomatous polyps, personal history of ulcer in the stomach, current dyspepsia  First, he is not due for surveillance colonoscopy until 3 years from the time of his last one which would be July 2014. He is already in our reminder system for that. He has mild dyspeptic symptoms. We will get records from his 2011-EGD done in [place] and I will  get in touch with him afterwards with further recommendations.

## 2011-03-20 ENCOUNTER — Telehealth: Payer: Self-pay | Admitting: Gastroenterology

## 2011-03-20 DIAGNOSIS — R1013 Epigastric pain: Secondary | ICD-10-CM

## 2011-03-20 NOTE — Telephone Encounter (Signed)
EGD 07/2008: Dr. Loman Chroman in [place]; done for eipigastric discomfort, fullness after eating; "multiple duodenal ulcers and erosions" and "patchy erythema" and "mild esophagitis".  Biopsies showed no h. Pylori.  He was advsided to avoid nsaids and start ppi.  Please call him.  I think with those findings we should repeat EGD given his current symptoms (EGD at [facility] for dyspepsia)

## 2011-03-20 NOTE — Telephone Encounter (Signed)
Spoke with patients wife and gave her recommendations. Scheduled patient for EGD on 03/30/11 at 2:30PM. Pre visit on 03/23/11 at 2:00 PM.

## 2011-03-20 NOTE — Telephone Encounter (Signed)
Left a message for patient to call back. 

## 2011-03-23 ENCOUNTER — Encounter: Payer: Self-pay | Admitting: Gastroenterology

## 2011-03-23 ENCOUNTER — Ambulatory Visit (AMBULATORY_SURGERY_CENTER): Payer: Medicare PPO | Admitting: *Deleted

## 2011-03-23 DIAGNOSIS — R1013 Epigastric pain: Secondary | ICD-10-CM

## 2011-03-23 DIAGNOSIS — K3189 Other diseases of stomach and duodenum: Secondary | ICD-10-CM

## 2011-03-30 ENCOUNTER — Ambulatory Visit (AMBULATORY_SURGERY_CENTER): Payer: Medicare PPO | Admitting: Gastroenterology

## 2011-03-30 ENCOUNTER — Encounter: Payer: Self-pay | Admitting: Gastroenterology

## 2011-03-30 VITALS — BP 147/88 | HR 78 | Temp 97.5°F | Resp 12 | Ht 67.0 in | Wt 188.0 lb

## 2011-03-30 DIAGNOSIS — R1013 Epigastric pain: Secondary | ICD-10-CM

## 2011-03-30 DIAGNOSIS — K3189 Other diseases of stomach and duodenum: Secondary | ICD-10-CM

## 2011-03-30 DIAGNOSIS — K2990 Gastroduodenitis, unspecified, without bleeding: Secondary | ICD-10-CM

## 2011-03-30 DIAGNOSIS — K2970 Gastritis, unspecified, without bleeding: Secondary | ICD-10-CM

## 2011-03-30 DIAGNOSIS — K2940 Chronic atrophic gastritis without bleeding: Secondary | ICD-10-CM

## 2011-03-30 MED ORDER — SODIUM CHLORIDE 0.9 % IV SOLN: 500.0000 mL | INTRAVENOUS | Status: DC

## 2011-03-30 NOTE — Patient Instructions (Signed)
Discharge instructions given with verbal understanding. Handouts on gastritis and a soft diet given. Resume previous medications.YOU HAD AN ENDOSCOPIC PROCEDURE TODAY AT THE [facility]: Refer to the procedure report that was given to you for any specific questions about what was found during the examination.  If the procedure report does not answer your questions, please call your gastroenterologist to clarify.  If you requested that your care partner not be given the details of your procedure findings, then the procedure report has been included in a sealed envelope for you to review at your convenience later.  YOU SHOULD EXPECT: Some feelings of bloating in the abdomen. Passage of more gas than usual.  Walking can help get rid of the air that was put into your GI tract during the procedure and reduce the bloating. If you had a lower endoscopy (such as a colonoscopy or flexible sigmoidoscopy) you may notice spotting of blood in your stool or on the toilet paper. If you underwent a bowel prep for your procedure, then you may not have a normal bowel movement for a few days.  DIET: Your first meal following the procedure should be a light meal and then it is ok to progress to your normal diet.  A half-sandwich or bowl of soup is an example of a good first meal.  Heavy or fried foods are harder to digest and may make you feel nauseous or bloated.  Likewise meals heavy in dairy and vegetables can cause extra gas to form and this can also increase the bloating.  Drink plenty of fluids but you should avoid alcoholic beverages for 24 hours.  ACTIVITY: Your care partner should take you home directly after the procedure.  You should plan to take it easy, moving slowly for the rest of the day.  You can resume normal activity the day after the procedure however you should NOT DRIVE or use heavy machinery for 24 hours (because of the sedation medicines used during the test).    SYMPTOMS TO REPORT  IMMEDIATELY: A gastroenterologist can be reached at any hour.  During normal business hours, 8:30 AM to 5:00 PM Monday through Friday, call [phone].  After hours and on weekends, please call the GI answering service at [phone] who will take a message and have the physician on call contact you.   Following upper endoscopy (EGD)  Vomiting of blood or coffee ground material  New chest pain or pain under the shoulder blades  Painful or persistently difficult swallowing  New shortness of breath  Fever of 100F or higher  Black, tarry-looking stools  FOLLOW UP: If any biopsies were taken you will be contacted by phone or by letter within the next 1-3 weeks.  Call your gastroenterologist if you have not heard about the biopsies in 3 weeks.  Our staff will call the home number listed on your records the next business day following your procedure to check on you and address any questions or concerns that you may have at that time regarding the information given to you following your procedure. This is a courtesy call and so if there is no answer at the home number and we have not heard from you through the emergency physician on call, we will assume that you have returned to your regular daily activities without incident.  SIGNATURES/CONFIDENTIALITY: You and/or your care partner have signed paperwork which will be entered into your electronic medical record.  These signatures attest to the fact that that the  information above on your After Visit Summary has been reviewed and is understood.  Full responsibility of the confidentiality of this discharge information lies with you and/or your care-partner.

## 2011-03-30 NOTE — Op Note (Signed)
[facility] [address] [place], [place]  [address]  ENDOSCOPY PROCEDURE REPORT  PATIENT:  Brandon Bradford, Brandon Bradford  MR#:  [id number] BIRTHDATE:  November 27, 1940, 70 yrs. old  GENDER:  male ENDOSCOPIST:  Rachael Fee, MD PROCEDURE DATE:  03/30/2011 PROCEDURE:  EGD with biopsy, 19147 ASA CLASS:  Class II INDICATIONS:  EGD 07/2008: Dr. Loman Chroman in [place]; done for eipigastric discomfort, fullness after eating; "multiple duodenal ulcers and erosions" and "patchy erythema" and "mild esophagitis".  Biopsies showed no h. Pylori.  He was advsided to avoid nsaids and start ppi.  Now with recurrent dyspepsia. MEDICATIONS:   Fentanyl 50 mcg IV, These medications were titrated to patient response per physician's verbal order, Versed 6 mg IV TOPICAL ANESTHETIC:  Cetacaine Spray  DESCRIPTION OF PROCEDURE:   After the risks benefits and alternatives of the procedure were thoroughly explained, informed consent was obtained.  The LB [id number]-H180 [id number] endoscope was introduced through the mouth and advanced to the second portion of the duodenum, without limitations.  The instrument was slowly withdrawn as the mucosa was fully examined. <<PROCEDUREIMAGES>> There was moderate non-specific pangastritis. Biopsies taken and sent to pathology (jar 1) (see image1).  Otherwise the examination was normal (see image2, image3, image1, image5, image4, and image6).    Retroflexed views revealed no abnormalities.    The scope was then withdrawn from the patient and the procedure completed. COMPLICATIONS:  None  ENDOSCOPIC IMPRESSION: 1) Moderate gastritis, biopsied to check for H. pylori 2) Otherwise normal examination  RECOMMENDATIONS: 1) Avoid NSAIDS for two weeks If biopsies show H. pylori, you will be started on the appropriate antibiotics.  ______________________________ Rachael Fee, MD  n. eSIGNED:   Rachael Fee at 03/30/2011 02:37 PM  [name], [phone]

## 2011-03-30 NOTE — Progress Notes (Signed)
Patient did not experience any of the following events: a burn prior to discharge; a fall within the facility; wrong site/side/patient/procedure/implant event; or a hospital transfer or hospital admission upon discharge from the facility. (G8907) Patient did not have preoperative order for IV antibiotic SSI prophylaxis. (G8918)  

## 2011-03-31 ENCOUNTER — Telehealth: Payer: Self-pay | Admitting: *Deleted

## 2011-03-31 NOTE — Telephone Encounter (Signed)
  Follow up Call-  Call back number 03/30/2011  Post procedure Call Back phone  # [phone]  Permission to leave phone message Yes     Patient questions:  Do you have a fever, pain , or abdominal swelling? no Pain Score  0 *  Have you tolerated food without any problems? yes  Have you been able to return to your normal activities? yes  Do you have any questions about your discharge instructions: Diet   no Medications  no Follow up visit  no  Do you have questions or concerns about your Care? no  Actions: * If pain score is 4 or above: No action needed, pain <4.

## 2011-04-03 ENCOUNTER — Encounter: Payer: Self-pay | Admitting: Gastroenterology

## 2011-07-16 ENCOUNTER — Other Ambulatory Visit: Payer: Self-pay | Admitting: Internal Medicine

## 2011-07-16 DIAGNOSIS — J029 Acute pharyngitis, unspecified: Secondary | ICD-10-CM

## 2011-07-16 MED ORDER — METOPROLOL SUCCINATE ER 50 MG PO TB24: 50.0000 mg | ORAL_TABLET | Freq: Every day | ORAL | Status: DC

## 2011-08-17 ENCOUNTER — Other Ambulatory Visit: Payer: Self-pay | Admitting: *Deleted

## 2011-08-17 MED ORDER — METOPROLOL SUCCINATE ER 50 MG PO TB24: 50.0000 mg | ORAL_TABLET | Freq: Every day | ORAL | Status: DC

## 2011-08-19 ENCOUNTER — Other Ambulatory Visit: Payer: Self-pay | Admitting: *Deleted

## 2011-08-19 DIAGNOSIS — I10 Essential (primary) hypertension: Secondary | ICD-10-CM

## 2011-08-19 MED ORDER — METOPROLOL SUCCINATE ER 50 MG PO TB24: 50.0000 mg | ORAL_TABLET | Freq: Every day | ORAL | Status: DC

## 2011-10-05 ENCOUNTER — Other Ambulatory Visit (INDEPENDENT_AMBULATORY_CARE_PROVIDER_SITE_OTHER): Payer: Medicare PPO

## 2011-10-05 ENCOUNTER — Ambulatory Visit (INDEPENDENT_AMBULATORY_CARE_PROVIDER_SITE_OTHER): Payer: Medicare PPO | Admitting: Internal Medicine

## 2011-10-05 ENCOUNTER — Encounter: Payer: Self-pay | Admitting: Internal Medicine

## 2011-10-05 ENCOUNTER — Ambulatory Visit (INDEPENDENT_AMBULATORY_CARE_PROVIDER_SITE_OTHER)
Admission: RE | Admit: 2011-10-05 | Discharge: 2011-10-05 | Disposition: A | Discharge status: Home / Self Care | Payer: Medicare PPO | Source: Ambulatory Visit | Attending: Internal Medicine | Admitting: Internal Medicine

## 2011-10-05 VITALS — BP 102/62 | HR 68 | Temp 98.1°F | Ht 67.0 in | Wt 186.0 lb

## 2011-10-05 DIAGNOSIS — Z125 Encounter for screening for malignant neoplasm of prostate: Secondary | ICD-10-CM

## 2011-10-05 DIAGNOSIS — R0789 Other chest pain: Secondary | ICD-10-CM

## 2011-10-05 DIAGNOSIS — Z23 Encounter for immunization: Secondary | ICD-10-CM

## 2011-10-05 DIAGNOSIS — Z79899 Other long term (current) drug therapy: Secondary | ICD-10-CM

## 2011-10-05 DIAGNOSIS — I10 Essential (primary) hypertension: Secondary | ICD-10-CM

## 2011-10-05 DIAGNOSIS — Z0000 Encounter for general adult medical examination without abnormal findings: Secondary | ICD-10-CM

## 2011-10-05 DIAGNOSIS — E785 Hyperlipidemia, unspecified: Secondary | ICD-10-CM

## 2011-10-05 DIAGNOSIS — R7309 Other abnormal glucose: Secondary | ICD-10-CM

## 2011-10-05 LAB — POCT URINALYSIS DIPSTICK
Blood, UA: NEGATIVE
Glucose, UA: NEGATIVE
Protein, UA: NEGATIVE
Spec Grav, UA: 1.015
Urobilinogen, UA: 0.2

## 2011-10-05 LAB — BASIC METABOLIC PANEL
BUN: 21 mg/dL (ref 6–23)
Calcium: 9.1 mg/dL (ref 8.4–10.5)
GFR: 62.29 mL/min (ref >60.00)
Glucose, Bld: 94 mg/dL (ref 70–99)
Potassium: 4.6 mEq/L (ref 3.5–5.1)
Sodium: 138 mEq/L (ref 135–145)

## 2011-10-05 LAB — CBC WITH DIFFERENTIAL/PLATELET
Basophils Absolute: 0 10*3/uL (ref 0.0–0.1)
Eosinophils Relative: 3.8 % (ref 0.0–5.0)
Lymphocytes Relative: 36.8 % (ref 12.0–46.0)
Monocytes Relative: 7.3 % (ref 3.0–12.0)
Platelets: 191 10*3/uL (ref 150.0–400.0)
RDW: 12.7 % (ref 11.5–14.6)
WBC: 6.3 10*3/uL (ref 4.5–10.5)

## 2011-10-05 LAB — MICROALBUMIN / CREATININE URINE RATIO: Creatinine,U: 163.2 mg/dL

## 2011-10-05 LAB — TSH: TSH: 1.33 u[IU]/mL (ref 0.35–5.50)

## 2011-10-05 LAB — LIPID PANEL
HDL: 61.4 mg/dL (ref >39.00)
Total CHOL/HDL Ratio: 2
Triglycerides: 87 mg/dL (ref 0.0–149.0)

## 2011-10-05 LAB — D-DIMER, QUANTITATIVE: D-Dimer, Quant: 0.55 ug/mL-FEU — ABNORMAL HIGH (ref 0.00–0.48)

## 2011-10-05 LAB — HEMOGLOBIN A1C: Hgb A1c MFr Bld: 5.7 % (ref 4.6–6.5)

## 2011-10-05 MED ORDER — ESOMEPRAZOLE MAGNESIUM 40 MG PO CPDR: 40.0000 mg | DELAYED_RELEASE_CAPSULE | Freq: Every day | ORAL | Status: DC

## 2011-10-05 MED ORDER — PANCRELIPASE (LIP-PROT-AMYL) 24000-76000 UNITS PO CPEP: 1.0000 | ORAL_CAPSULE | Freq: Three times a day (TID) | ORAL | Status: DC

## 2011-10-05 MED ORDER — VALSARTAN 80 MG PO TABS: 40.0000 mg | ORAL_TABLET | Freq: Every day | ORAL | Status: DC

## 2011-10-05 MED ORDER — METOPROLOL SUCCINATE ER 50 MG PO TB24: 50.0000 mg | ORAL_TABLET | Freq: Every day | ORAL | Status: DC

## 2011-10-05 MED ORDER — ATORVASTATIN CALCIUM 40 MG PO TABS: 40.0000 mg | ORAL_TABLET | Freq: Every day | ORAL | Status: DC

## 2011-10-05 NOTE — Assessment & Plan Note (Signed)
71 year old [place] male with atypical substernal chest pain. His symptoms are triggered by deep inhalation. He denies any preceding illness. I doubt his symptoms are secondary to pleurisy. EKG shows Sinus  Rhythm, -Left axis -anterior fascicular block.  There is no significant change when compared to previous EKG. His oxygen saturation is 96% room air. Obtain d-dimer.  It is unclear whether his gastritis is playing a role. Patient advised to restart Nexium 40 mg once daily.  Patient advised to call office if symptoms persist or worsen. Reassess in 2 weeks.

## 2011-10-05 NOTE — Patient Instructions (Signed)
Our office will contact you re: chest x ray results Your EKG was normal Please call our office if your symptoms do not improve or gets worse.

## 2011-10-05 NOTE — Assessment & Plan Note (Signed)
Well controlled.  Continue Lipitor.  Lab Results  Component Value Date   CHOL 151 10/05/2011   HDL 61.40 10/05/2011   LDLCALC 72 10/05/2011   TRIG 87.0 10/05/2011   CHOLHDL 2 10/05/2011

## 2011-10-05 NOTE — Progress Notes (Signed)
Subjective:    Patient ID: Brandon Bradford, male    DOB: 1940-09-03, 71 y.o.   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 His symptoms are nonexertional. He describes a tight sensation especially with deep inhalation of his lower sternum. Patient also reports some discomfort with lifting heavy objects. He denies any diaphoresis or radiation of pain.  He does not have any associated shortness of breath.  He was seen by his gastroenterologist in March of 2013. EGD was notable for gastritis. He took Nexium for about a month but then discontinued.He still has intermittent abdominal discomfort/epigastric pain.  EKG reviewed.  Lab results reviewed in detail the patient. Patient's A1c decreased to 5.7. His electrolytes and serum creatinine are stable. Lipid panel is well-controlled. His PSA is <1.  Review of Systems  Intermittent epigastric discomfort, rare belching.  Bowel movements are normal  Past Medical History  Diagnosis Date  . Hypertension   . Hyperlipidemia   . Gastritis   . Colitis, ischemic   . Vertigo, intermittent   . Abnormal glucose 07/22/09    A1c 6.1  . Paroxysmal atrial fibrillation   . AAA (abdominal aortic aneurysm)     3.4 cm  . GERD (gastroesophageal reflux disease)   . Hx of colonic polyps     History   Social History  . Marital Status: Married    Spouse Name: N/A    Number of Children: 4  . Years of Education: N/A   Occupational History  . RETIRED    Social History Main Topics  . Smoking status: Former Games developer  . Smokeless tobacco: Never Used  . Alcohol Use: No  . Drug Use: No  . Sexually Active: Not on file   Other Topics Concern  . Not on file   Social History Narrative  . No narrative on file    Past Surgical History  Procedure Date  . Appendectomy 1971  . Hemorroidectomy 2002  . Hemicolectomy     Right     Family History  Problem Relation Age of Onset  . Colon cancer Neg Hx     No Active Allergies  Current Outpatient Prescriptions on File Prior to Visit  Medication Sig Dispense Refill  . Multiple Vitamin (MULTIVITAMIN) tablet Take 1 tablet by mouth daily.      Marland Kitchen VITAMIN E PO Take 1 tablet by mouth daily.      Marland Kitchen DISCONTD: atorvastatin (LIPITOR) 40 MG tablet Take 1 tablet (40 mg total) by mouth daily.  90 tablet  1  . DISCONTD: metoprolol succinate (TOPROL-XL) 50 MG 24 hr tablet Take 1 tablet (50 mg total) by mouth daily.  90 tablet  1  . DISCONTD: valsartan (DIOVAN) 80 MG tablet Take 40 mg by mouth daily.      Marland Kitchen esomeprazole (NEXIUM) 40 MG capsule Take 1 capsule (40 mg total) by mouth daily.  90 capsule  1    BP 102/62  Pulse 68  Temp 98.1 F (36.7 C) (Oral)  Ht 5\' 7"  (1.702 m)  Wt 186 lb (84.369 kg)  BMI 29.13 kg/m2       Objective:   Physical Exam  Constitutional: He appears well-developed and well-nourished. No distress.  HENT:  Head: Normocephalic and atraumatic.  Right Ear: External ear normal.  Left Ear: External ear normal.  Mouth/Throat: Oropharynx is clear and moist.  Eyes: EOM are normal.  Pupils are equal, round, and reactive to light.  Neck: Normal range of motion. Neck supple.       No carotid bruit  Cardiovascular: Normal rate, regular rhythm, normal heart sounds and intact distal pulses.        Negative for sternal tenderness  Pulmonary/Chest: Effort normal and breath sounds normal. He has no wheezes. He has no rales. He exhibits no tenderness.  Abdominal: Soft. Bowel sounds are normal.       Mild epigastric tenderness  Musculoskeletal: He exhibits no edema.  Lymphadenopathy:    He has no cervical adenopathy.  Neurological: He is alert. No cranial nerve deficit.  Skin: Skin is warm and dry.  Psychiatric: He has a normal mood and affect. His behavior is normal.          Assessment & Plan:

## 2011-10-05 NOTE — Assessment & Plan Note (Signed)
Well controlled.   No change in blood pressure medication regimen. BP: 102/62 mmHg  Lab Results  Component Value Date   CREATININE 1.2 10/05/2011   Lab Results  Component Value Date   NA 138 10/05/2011   K 4.6 10/05/2011   CL 107 10/05/2011   CO2 24 10/05/2011

## 2011-10-05 NOTE — Assessment & Plan Note (Signed)
Improved.  A1c has normalized.  Continue lifestyle changes.   Lab Results  Component Value Date   HGBA1C 5.7 10/05/2011

## 2011-10-06 ENCOUNTER — Telehealth: Payer: Self-pay | Admitting: Family Medicine

## 2011-10-06 DIAGNOSIS — R079 Chest pain, unspecified: Secondary | ICD-10-CM

## 2011-10-06 NOTE — Telephone Encounter (Signed)
pts wife aware, referral order placed

## 2011-10-06 NOTE — Telephone Encounter (Signed)
Call pt - D Dimer slightly elevated at 0.55.  I suggest we obtain CT of chest PE protocol.  Please place order.

## 2011-10-06 NOTE — Telephone Encounter (Signed)
[facility] Triage Call Report Triage Record Num: [id number] Operator: Caswell Corwin Patient Name: Brandon Bradford Call Date & Time: 10/05/2011 9:32:18PM Patient Phone: [phone] PCP: Patient Gender: Male PCP Fax : Patient DOB: [birth year] Practice Name: [facility] Reason for Call: Caller: [name]; PCP: [name]) (Adults only); CB#: [phone]; Call regarding [name] is calling from [employer] regarding a d dimer ordered on [name], [name] by Artist Pais, [name]) (Adults only). D-dimer high at 0.55, but not critical. Blood not hemolyzed. No previous results. Protocol(s) Used: PCP Calls, No Triage (Adult) Recommended Outcome per Protocol: Call Provider within 24 Hours Reason for Outcome: Lab calling with test results Care Advice: ~ 09/

## 2011-10-06 NOTE — Telephone Encounter (Signed)
Left message for pt to call back  °

## 2011-10-07 ENCOUNTER — Ambulatory Visit (INDEPENDENT_AMBULATORY_CARE_PROVIDER_SITE_OTHER)
Admission: RE | Admit: 2011-10-07 | Discharge: 2011-10-07 | Disposition: A | Discharge status: Home / Self Care | Payer: Medicare PPO | Source: Ambulatory Visit | Attending: Internal Medicine | Admitting: Internal Medicine

## 2011-10-07 ENCOUNTER — Telehealth: Payer: Self-pay | Admitting: *Deleted

## 2011-10-07 DIAGNOSIS — R079 Chest pain, unspecified: Secondary | ICD-10-CM

## 2011-10-07 MED ORDER — IOHEXOL 350 MG/ML SOLN
80.0000 mL | Freq: Once | INTRAVENOUS | Status: AC | PRN
  Administered 2011-10-07: 80 mL via INTRAVENOUS

## 2011-10-07 NOTE — Telephone Encounter (Signed)
Message copied by Jacqualyn Posey on Wed Oct 07, 2011  4:36 PM ------      Message from: Meda Coffee      Created: Wed Oct 07, 2011  4:19 PM       Call pt - CT of chest is normal.  Negative for blood clot

## 2011-10-07 NOTE — Telephone Encounter (Signed)
Pt aware.

## 2011-10-08 ENCOUNTER — Encounter: Payer: Medicare PPO | Admitting: Internal Medicine

## 2011-12-15 ENCOUNTER — Telehealth: Payer: Self-pay | Admitting: Internal Medicine

## 2011-12-15 ENCOUNTER — Other Ambulatory Visit: Payer: Self-pay | Admitting: Cardiology

## 2011-12-15 ENCOUNTER — Ambulatory Visit (INDEPENDENT_AMBULATORY_CARE_PROVIDER_SITE_OTHER): Payer: Medicare PPO | Admitting: Family Medicine

## 2011-12-15 ENCOUNTER — Encounter: Payer: Self-pay | Admitting: Family Medicine

## 2011-12-15 VITALS — BP 118/60 | HR 73 | Temp 98.4°F | Wt 182.0 lb

## 2011-12-15 DIAGNOSIS — I10 Essential (primary) hypertension: Secondary | ICD-10-CM

## 2011-12-15 DIAGNOSIS — K219 Gastro-esophageal reflux disease without esophagitis: Secondary | ICD-10-CM

## 2011-12-15 DIAGNOSIS — I714 Abdominal aortic aneurysm, without rupture: Secondary | ICD-10-CM

## 2011-12-15 DIAGNOSIS — I701 Atherosclerosis of renal artery: Secondary | ICD-10-CM

## 2011-12-15 DIAGNOSIS — R002 Palpitations: Secondary | ICD-10-CM

## 2011-12-15 NOTE — Telephone Encounter (Signed)
Patient Information:  Caller Name: Brandon Bradford  Phone: [phone]  Patient: Brandon Bradford  Gender: Male  DOB: 03/14/1940  Age: 71 Years  PCP: Brandon Bradford) (Adults only)   Symptoms  Reason For Call & Symptoms: palpitations, headache, fever  Reviewed Health History In EMR: Yes  Reviewed Medications In EMR: Yes  Reviewed Allergies In EMR: Yes  Date of Onset of Symptoms: 12/10/2011  Guideline(s) Used:  Headache  Chest Pain  Disposition Per Guideline:   Callback by PCP or Subspecialist within 1 Hour  Reason For Disposition Reached:   Severe headache and fever  Advice Given:  N/A  Office Follow Up:  Does the office need to follow up with this patient?: No  Instructions For The Office: N/A  RN Note:  BP 160/96, HR 71; has had several episodes of palpitations since 12/12/11.  Denies chest pain currently.  Appt scheduled 1050 12/15/11 with Dr. Selena Bradford.

## 2011-12-15 NOTE — Patient Instructions (Addendum)
-  restart your acid reflux medication (NEXIUM) - take every day  -continue your blood pressure medications  -check blood pressure one time daily and keep log: If BP > 170 on top or >90 on the bottom let your doctor know Bring your cuff to follow up with your doctor  Go get [place] today  Follow up with your doctor in 1-2 weeks or sooner if worsening symptoms or other concerns

## 2011-12-15 NOTE — Progress Notes (Signed)
Chief Complaint  Patient presents with  . Palpitations    fever, headache started last saturday     HPI:  Feels unwell: -started about 3 days ago -symptoms:heartburn, reflux, HA on Saturday - had creamy, very salty cheesy dish on Saturday and then reflux worse, diarrhea yesterday and today once,  BP has been running high - home log with BP 130-190 systolic, palpitations on saturday - has this once and awhile - none now, has been worried/anxious -takes blood pressure medication every day - toprol XL 50mg  daily and diovan 40 mg daily -started osomeprazole in September, but stopped it 1 week ago because tongue felt rough -symptoms worse at night -denies: fevers, SOB, vomiting, CP, blood in stools, pain through chest or abdomen to back,, dysphagia, cough  ROS: See pertinent positives and negatives per HPI.  Past Medical History  Diagnosis Date  . Hypertension   . Hyperlipidemia   . Gastritis   . Colitis, ischemic   . Vertigo, intermittent   . Abnormal glucose 07/22/09    A1c 6.1  . Paroxysmal atrial fibrillation   . AAA (abdominal aortic aneurysm)     3.4 cm  . GERD (gastroesophageal reflux disease)   . Hx of colonic polyps     Family History  Problem Relation Age of Onset  . Colon cancer Neg Hx     History   Social History  . Marital Status: Married    Spouse Name: N/A    Number of Children: 4  . Years of Education: N/A   Occupational History  . RETIRED    Social History Main Topics  . Smoking status: Former Games developer  . Smokeless tobacco: Never Used  . Alcohol Use: No  . Drug Use: No  . Sexually Active: None   Other Topics Concern  . None   Social History Narrative  . None    Current outpatient prescriptions:atorvastatin (LIPITOR) 40 MG tablet, Take 1 tablet (40 mg total) by mouth daily., Disp: 90 tablet, Rfl: 1;  metoprolol succinate (TOPROL-XL) 50 MG 24 hr tablet, Take 1 tablet (50 mg total) by mouth daily., Disp: 90 tablet, Rfl: 1;  Multiple Vitamin  (MULTIVITAMIN) tablet, Take 1 tablet by mouth daily., Disp: , Rfl:  Pancrelipase, Lip-Prot-Amyl, (CREON) 24000 UNITS CPEP, Take 1 capsule (24,000 Units total) by mouth 3 (three) times daily., Disp: 270 capsule, Rfl: 3;  valsartan (DIOVAN) 80 MG tablet, Take 0.5 tablets (40 mg total) by mouth daily., Disp: 90 tablet, Rfl: 1;  VITAMIN E PO, Take 1 tablet by mouth daily., Disp: , Rfl: ;  esomeprazole (NEXIUM) 40 MG capsule, Take 1 capsule (40 mg total) by mouth daily., Disp: 90 capsule, Rfl: 1  EXAM:  Filed Vitals:   12/15/11 1047  BP: 118/60  Pulse: 73  Temp: 98.4 F (36.9 C)    There is no height on file to calculate BMI.  GENERAL: vitals reviewed and listed above, alert, oriented, appears well hydrated and in no acute distress  HEENT: atraumatic, conjunttiva clear, no obvious abnormalities on inspection of external nose and ears - tongue appears normal  NECK: no obvious masses on inspection  LUNGS: clear to auscultation bilaterally, no wheezes, rales or rhonchi, good air movement  CV: HRRR, no peripheral edema  ABD: BS normal, no bruit, soft, NTTP  MS: moves all extremities without noticeable abnormality  PSYCH: pleasant and cooperative, no obvious depression or anxiety  ASSESSMENT AND PLAN:  Discussed the following assessment and plan:  1. Palpitations  EKG 12-Lead  2.  GERD (gastroesophageal reflux disease)    3. HTN (hypertension)     -pt appears well and currently is asymptomatic - however, he reports indigestion, heartburn, acid reflux and elevation of BP at night for last few days after eating salty cheesy creamy food on saturday -he had stopped his PPI last week due to bumps on tongue - tongue symptoms still there, tongue appears normal -BP normal here and EKG unchanged -feel strongly that symptoms are recurrence of his acid reflux after stopping PPI - restarted today and discussed diet for GERD at length -[place] to assess his AAA, though no symptoms to suggest this has  changed - [place] scheduled and appt details given to patient -Patient advised to follow with PCP in 1-2 weeks or return or notify a doctor immediately if symptoms worsen or persist or new concerns arise. > 45 minutes spent face to face with patient and his wife in counseling and discussion and exam  Patient Instructions  -restart your acid reflux medication (NEXIUM) - take every day  -continue your blood pressure medications  -check blood pressure one time daily and keep log: If BP > 170 on top or >90 on the bottom let your doctor know Bring your cuff to follow up with your doctor  Follow up with your doctor in 1-2 weeks or sooner if worsening symptoms or other concerns     Everlean Bucher R.

## 2011-12-15 NOTE — Telephone Encounter (Signed)
No follow up required, closing encounter. °

## 2011-12-16 ENCOUNTER — Encounter (INDEPENDENT_AMBULATORY_CARE_PROVIDER_SITE_OTHER): Payer: Medicare PPO

## 2011-12-16 DIAGNOSIS — I714 Abdominal aortic aneurysm, without rupture: Secondary | ICD-10-CM

## 2011-12-29 ENCOUNTER — Ambulatory Visit (INDEPENDENT_AMBULATORY_CARE_PROVIDER_SITE_OTHER): Payer: Medicare PPO | Admitting: Internal Medicine

## 2011-12-29 ENCOUNTER — Encounter: Payer: Self-pay | Admitting: Internal Medicine

## 2011-12-29 VITALS — BP 120/70 | HR 80 | Temp 98.1°F | Wt 184.0 lb

## 2011-12-29 DIAGNOSIS — I10 Essential (primary) hypertension: Secondary | ICD-10-CM

## 2011-12-29 DIAGNOSIS — R002 Palpitations: Secondary | ICD-10-CM

## 2011-12-29 DIAGNOSIS — I714 Abdominal aortic aneurysm, without rupture, unspecified: Secondary | ICD-10-CM

## 2011-12-29 DIAGNOSIS — K30 Functional dyspepsia: Secondary | ICD-10-CM

## 2011-12-29 DIAGNOSIS — R1013 Epigastric pain: Secondary | ICD-10-CM

## 2011-12-29 MED ORDER — VALSARTAN 80 MG PO TABS: 80.0000 mg | ORAL_TABLET | Freq: Every day | ORAL | Status: DC

## 2011-12-29 MED ORDER — BISOPROLOL FUMARATE 5 MG PO TABS: 5.0000 mg | ORAL_TABLET | Freq: Every day | ORAL | Status: DC

## 2011-12-29 MED ORDER — OMEPRAZOLE 40 MG PO CPDR: 40.0000 mg | DELAYED_RELEASE_CAPSULE | Freq: Every day | ORAL | Status: DC

## 2011-12-29 NOTE — Progress Notes (Signed)
Subjective:    Patient ID: Brandon Bradford, male    DOB: [birth year], 71 y.o.   MRN: [id number]  HPI  71 year old [place] male for followup regarding palpitations and epigastric discomfort. Patient previously seen by Dr. Selena Batten.  Patient concerned because his blood pressure trending upward over last several weeks. Symptoms started after eating food item that was especially salty. Patient does experience significant palpitations at night. He has history of paroxysmal atrial fibrillation. He denies any associated dizziness or chest pain.  Patient also complains of chronic indigestion. He feels like food does not go down after eating. He has been eating smaller meals to compensate. Dr. Selena Batten thought his symptoms may be secondary to not taking his PPI a regular basis. He resumed taking Nexium. He has not noticed any significant improvement.  He has been taking extra dose of metoprolol at bedtime. This has been helping with his palpitations. His blood pressure is also trending lower.  Review of Systems Negative for chest pain, negative for shortness of breath.  No symptoms with exercise.  He walks golf course without difficulty.  Past Medical History  Diagnosis Date  . Hypertension   . Hyperlipidemia   . Gastritis   . Colitis, ischemic   . Vertigo, intermittent   . Abnormal glucose 07/22/09    A1c 6.1  . Paroxysmal atrial fibrillation   . AAA (abdominal aortic aneurysm)     3.4 cm  . GERD (gastroesophageal reflux disease)   . Hx of colonic polyps     History   Social History  . Marital Status: Married    Spouse Name: N/A    Number of Children: 4  . Years of Education: N/A   Occupational History  . RETIRED    Social History Main Topics  . Smoking status: Former Games developer  . Smokeless tobacco: Never Used  . Alcohol Use: No  . Drug Use: No  . Sexually Active: Not on file   Other Topics Concern  . Not on file   Social History Narrative  . No narrative on file    Past Surgical History   Procedure Date  . Appendectomy 1971  . Hemorroidectomy 2002  . Hemicolectomy     Right    Family History  Problem Relation Age of Onset  . Colon cancer Neg Hx     No Active Allergies  Current Outpatient Prescriptions on File Prior to Visit  Medication Sig Dispense Refill  . atorvastatin (LIPITOR) 40 MG tablet Take 1 tablet (40 mg total) by mouth daily.  90 tablet  1  . Multiple Vitamin (MULTIVITAMIN) tablet Take 1 tablet by mouth daily.      . Pancrelipase, Lip-Prot-Amyl, (CREON) 24000 UNITS CPEP Take 1 capsule (24,000 Units total) by mouth 3 (three) times daily.  270 capsule  3  . VITAMIN E PO Take 1 tablet by mouth daily.      . [DISCONTINUED] valsartan (DIOVAN) 80 MG tablet Take 0.5 tablets (40 mg total) by mouth daily.  90 tablet  1  . bisoprolol (ZEBETA) 5 MG tablet Take 1 tablet (5 mg total) by mouth daily.  30 tablet  3    BP 120/70  Pulse 80  Temp 98.1 F (36.7 C) (Oral)  Wt 184 lb (83.462 kg)       Objective:   Physical Exam  Constitutional: He is oriented to person, place, and time. He appears well-developed and well-nourished. No distress.  HENT:  Head: Normocephalic and atraumatic.  Neck: Normal range of motion. Neck  supple.  Cardiovascular: Normal rate, regular rhythm and normal heart sounds.   Pulmonary/Chest: Effort normal and breath sounds normal. He has no wheezes.  Abdominal: Soft. Bowel sounds are normal. He exhibits no mass. There is no rebound.       Mild epigastric tenderness  Musculoskeletal: He exhibits no edema.  Neurological: He is alert and oriented to person, place, and time. No cranial nerve deficit.  Skin: Skin is warm and dry.  Psychiatric: He has a normal mood and affect. His behavior is normal.          Assessment & Plan:

## 2011-12-29 NOTE — Assessment & Plan Note (Signed)
Repeat abdominal ultrasound showed stable size of small abnormal aortic aneurysm. I doubt his abdominal complaints related to AAA.

## 2011-12-29 NOTE — Assessment & Plan Note (Signed)
71 year old Asian male with history of hypertension complains of nocturnal intermittent palpitations. He was seen by Dr. Selena Batten on 11/14/2011. His EKG  showed normal sinus rhythm at 67 beats per minute with left axis anterior fascicular block. Otherwise EKG was normal. He does have history of paroxysmal A. Fib. He declines Holter monitor for now. His heart palpitations have been improving with higher dose of beta blocker. I suggest we switch from metoprolol to bisoprolol 5 mg once daily.  Obtain BMET, CBCD, Magnesium level, and TFTs.  Consider start aspirin therapy.

## 2011-12-29 NOTE — Assessment & Plan Note (Signed)
His EGD in March of 2013 showed gastritis. Biopsy negative for H. Pylori. He has symptoms as if feels like food sits in his stomach.  Consider testing for gastroparesis.  Continue omeprazole 40 mg once daily for now.

## 2011-12-29 NOTE — Assessment & Plan Note (Signed)
I reviewed his home blood pressure readings. His systolic blood pressure has been ranging from 130s to 170s. I suggest we increase Diovan to 80 mg once daily. Change metoprolol to bisoprolol 5 mg once daily. Reassess in 2 weeks.

## 2012-01-08 ENCOUNTER — Other Ambulatory Visit: Payer: Self-pay | Admitting: Internal Medicine

## 2012-01-08 LAB — CBC WITH DIFFERENTIAL/PLATELET
Basophils Relative: 1 % (ref 0–1)
Eosinophils Absolute: 0.3 10*3/uL (ref 0.0–0.7)
Hemoglobin: 15.5 g/dL (ref 13.0–17.0)
MCH: 32.4 pg (ref 26.0–34.0)
MCHC: 34.3 g/dL (ref 30.0–36.0)
Monocytes Relative: 7 % (ref 3–12)
Neutrophils Relative %: 43 % (ref 43–77)
Platelets: 227 10*3/uL (ref 150–400)

## 2012-01-08 LAB — MAGNESIUM: Magnesium: 2.3 mg/dL (ref 1.5–2.5)

## 2012-01-09 LAB — BASIC METABOLIC PANEL
Calcium: 9.9 mg/dL (ref 8.4–10.5)
Creat: 1.33 mg/dL (ref 0.50–1.35)
Glucose, Bld: 119 mg/dL — ABNORMAL HIGH (ref 70–99)
Sodium: 142 mEq/L (ref 135–145)

## 2012-01-19 ENCOUNTER — Telehealth: Payer: Self-pay | Admitting: Internal Medicine

## 2012-01-19 NOTE — Telephone Encounter (Signed)
Opened in error/kjh 

## 2012-01-22 ENCOUNTER — Ambulatory Visit: Payer: Medicare PPO | Admitting: Internal Medicine

## 2012-01-22 ENCOUNTER — Encounter: Payer: Self-pay | Admitting: Internal Medicine

## 2012-01-22 ENCOUNTER — Ambulatory Visit (INDEPENDENT_AMBULATORY_CARE_PROVIDER_SITE_OTHER): Payer: Medicare PPO | Admitting: Internal Medicine

## 2012-01-22 VITALS — BP 132/74 | HR 77 | Temp 98.4°F | Wt 184.0 lb

## 2012-01-22 DIAGNOSIS — K3189 Other diseases of stomach and duodenum: Secondary | ICD-10-CM

## 2012-01-22 DIAGNOSIS — K30 Functional dyspepsia: Secondary | ICD-10-CM

## 2012-01-22 DIAGNOSIS — R002 Palpitations: Secondary | ICD-10-CM

## 2012-01-22 DIAGNOSIS — I10 Essential (primary) hypertension: Secondary | ICD-10-CM

## 2012-01-22 LAB — BASIC METABOLIC PANEL
Chloride: 106 mEq/L (ref 96–112)
Creatinine, Ser: 1.2 mg/dL (ref 0.4–1.5)
Potassium: 4.6 mEq/L (ref 3.5–5.1)
Sodium: 139 mEq/L (ref 135–145)

## 2012-01-22 NOTE — Progress Notes (Signed)
Subjective:    Patient ID: Brandon Bradford, male    DOB: 08-24-1940, 72 y.o.   MRN: [id number]  HPI  72 year old [place] male previously seen for palpitations, epigastric discomfort and hypertension for follow up. Patient reports using bisoprolol 5 mg for one week. He discontinued secondary to complaints of slight dizziness and hand swelling. Over the last 1 to 2 weeks, he has observed upper trend of his blood pressure and pulse.  While he was taking bisoprolol, his palpitations significantly improved.  Review of Systems Negative for chest pain,  Intermittent dyspepsia  Past Medical History  Diagnosis Date  . Hypertension   . Hyperlipidemia   . Gastritis   . Colitis, ischemic   . Vertigo, intermittent   . Abnormal glucose 07/22/09    A1c 6.1  . Paroxysmal atrial fibrillation   . AAA (abdominal aortic aneurysm)     3.4 cm  . GERD (gastroesophageal reflux disease)   . Hx of colonic polyps     History   Social History  . Marital Status: Married    Spouse Name: N/A    Number of Children: 4  . Years of Education: N/A   Occupational History  . RETIRED    Social History Main Topics  . Smoking status: Former Games developer  . Smokeless tobacco: Never Used  . Alcohol Use: No  . Drug Use: No  . Sexually Active: Not on file   Other Topics Concern  . Not on file   Social History Narrative  . No narrative on file    Past Surgical History  Procedure Date  . Appendectomy 1971  . Hemorroidectomy 2002  . Hemicolectomy     Right    Family History  Problem Relation Age of Onset  . Colon cancer Neg Hx     No Active Allergies  Current Outpatient Prescriptions on File Prior to Visit  Medication Sig Dispense Refill  . atorvastatin (LIPITOR) 40 MG tablet Take 1 tablet (40 mg total) by mouth daily.  90 tablet  1  . bisoprolol (ZEBETA) 5 MG tablet Take 0.5 tablets (2.5 mg total) by mouth daily.  30 tablet  3  . Multiple Vitamin (MULTIVITAMIN) tablet Take 1 tablet by mouth daily.      Marland Kitchen  omeprazole (PRILOSEC) 40 MG capsule Take 1 capsule (40 mg total) by mouth daily.  90 capsule  3  . Pancrelipase, Lip-Prot-Amyl, (CREON) 24000 UNITS CPEP Take 1 capsule (24,000 Units total) by mouth 3 (three) times daily.  270 capsule  3  . valsartan (DIOVAN) 80 MG tablet Take 1 tablet (80 mg total) by mouth daily.  90 tablet  1  . VITAMIN E PO Take 1 tablet by mouth daily.        BP 132/74  Pulse 77  Temp 98.4 F (36.9 C) (Oral)  Wt 184 lb (83.462 kg)       Objective:   Physical Exam  Constitutional: He appears well-developed and well-nourished.  Cardiovascular: Normal rate, regular rhythm and normal heart sounds.   Pulmonary/Chest: Effort normal and breath sounds normal. He has no wheezes.  Musculoskeletal: He exhibits no edema.  Lymphadenopathy:       Right cervical: No superficial cervical, no deep cervical and no posterior cervical adenopathy present.      Left cervical: No superficial cervical, no deep cervical and no posterior cervical adenopathy present.       Right axillary: No pectoral and no lateral adenopathy present.       Left axillary: No  pectoral and no lateral adenopathy present. Skin: Skin is warm and dry.          Assessment & Plan:

## 2012-01-22 NOTE — Assessment & Plan Note (Addendum)
Improved with bisoprolol.  Patient advised to take 1/2 dose due to bradycardia

## 2012-01-22 NOTE — Assessment & Plan Note (Signed)
Patient recently switched to bisoprolol 5 mg. Full dose causing mild bradycardia. Patient advised to decrease dose 2.5 mg once daily. Continue Diovan 80 mg once daily. Monitor kidney function. BP: 132/74 mmHg

## 2012-01-22 NOTE — Assessment & Plan Note (Signed)
Symptoms somewhat improved with taking proton pump inhibitor regularly. Previous CT of abdomen and pelvis from 08/11/2009 completed at [facility] reviewed. It showed the spleen and pancreas are normal in size and appearance. No dilatation of main pancreatic duct and secondary ducts.

## 2012-01-28 ENCOUNTER — Ambulatory Visit: Payer: Medicare PPO | Admitting: Internal Medicine

## 2012-02-15 ENCOUNTER — Other Ambulatory Visit: Payer: Self-pay | Admitting: Internal Medicine

## 2012-02-15 MED ORDER — METOPROLOL SUCCINATE ER 25 MG PO TB24: 75.0000 mg | ORAL_TABLET | Freq: Every day | ORAL | Status: DC

## 2012-06-07 ENCOUNTER — Ambulatory Visit (INDEPENDENT_AMBULATORY_CARE_PROVIDER_SITE_OTHER): Payer: Medicare PPO | Admitting: Internal Medicine

## 2012-06-07 ENCOUNTER — Encounter: Payer: Self-pay | Admitting: Internal Medicine

## 2012-06-07 VITALS — BP 108/64 | HR 68 | Temp 98.0°F | Wt 179.0 lb

## 2012-06-07 DIAGNOSIS — R42 Dizziness and giddiness: Secondary | ICD-10-CM

## 2012-06-07 DIAGNOSIS — R7309 Other abnormal glucose: Secondary | ICD-10-CM

## 2012-06-07 DIAGNOSIS — I10 Essential (primary) hypertension: Secondary | ICD-10-CM

## 2012-06-07 DIAGNOSIS — I4891 Unspecified atrial fibrillation: Secondary | ICD-10-CM

## 2012-06-07 MED ORDER — VALSARTAN 40 MG PO TABS: 40.0000 mg | ORAL_TABLET | Freq: Every day | ORAL | Status: DC

## 2012-06-07 NOTE — Assessment & Plan Note (Signed)
Monitor A1c 

## 2012-06-07 NOTE — Progress Notes (Signed)
  Subjective:    Patient ID: Brandon Bradford, male    DOB: 17-May-1940, 72 y.o.   MRN: [id number]  HPI  72 year old [place] male with history of hypertension, hyperlipidemia and paroxysmal A. Fib for followup. Patient complains of intermittent dizziness. This has occurred over last several weeks. He has been trying to lose weight. He has been juicing which may have led to lower blood pressure readings. He also has experienced loose stools for the last 3 weeks.  He was making a shake with yogurt and tofu.  He discontinued dairy products.  Loose stools have improved.   Review of Systems Negative for tachycardia or chest pain    Past Medical History  Diagnosis Date  . Hypertension   . Hyperlipidemia   . Gastritis   . Colitis, ischemic   . Vertigo, intermittent   . Abnormal glucose 07/22/09    A1c 6.1  . Paroxysmal atrial fibrillation   . AAA (abdominal aortic aneurysm)     3.4 cm  . GERD (gastroesophageal reflux disease)   . Hx of colonic polyps     History   Social History  . Marital Status: Married    Spouse Name: N/A    Number of Children: 4  . Years of Education: N/A   Occupational History  . RETIRED    Social History Main Topics  . Smoking status: Former Games developer  . Smokeless tobacco: Never Used  . Alcohol Use: No  . Drug Use: No  . Sexually Active: Not on file   Other Topics Concern  . Not on file   Social History Narrative  . No narrative on file    Past Surgical History  Procedure Laterality Date  . Appendectomy  1971  . Hemorroidectomy  2002  . Hemicolectomy      Right    Family History  Problem Relation Age of Onset  . Colon cancer Neg Hx     No Active Allergies  Current Outpatient Prescriptions on File Prior to Visit  Medication Sig Dispense Refill  . atorvastatin (LIPITOR) 40 MG tablet Take 1 tablet (40 mg total) by mouth daily.  90 tablet  1  . Multiple Vitamin (MULTIVITAMIN) tablet Take 1 tablet by mouth daily.      Marland Kitchen omeprazole (PRILOSEC) 40 MG  capsule Take 1 capsule (40 mg total) by mouth daily.  90 capsule  3  . Pancrelipase, Lip-Prot-Amyl, (CREON) 24000 UNITS CPEP Take 1 capsule (24,000 Units total) by mouth 3 (three) times daily.  270 capsule  3  . VITAMIN E PO Take 1 tablet by mouth daily.       No current facility-administered medications on file prior to visit.    BP 108/64  Pulse 68  Temp(Src) 98 F (36.7 C) (Oral)  Wt 179 lb (81.194 kg)  BMI 28.03 kg/m2    Objective:   Physical Exam  Constitutional: He appears well-developed and well-nourished.  Cardiovascular: Normal rate, regular rhythm and normal heart sounds.   No murmur heard. Pulmonary/Chest: Effort normal and breath sounds normal.  Neurological: He is alert.          Assessment & Plan:

## 2012-06-07 NOTE — Assessment & Plan Note (Signed)
Patient having issues with hypotension and bradycardia.  Stop metoprolol.

## 2012-06-07 NOTE — Assessment & Plan Note (Signed)
Patient recently started juicing. He also does experience loose stools for 3 weeks. This has led to lower blood pressure readings. Discontinue metoprolol XL and Diovan. Patient to monitor blood pressure readings at home. If his systolic blood pressure greater than 140,  patient advised to restart valsartan at 40 mg. Reassess in 2 months. Check electrolytes and kidney function before next office visit. Patient advised to increase salt and fluid intake. Patient will also let us know if persistent loose stools.

## 2012-06-07 NOTE — Patient Instructions (Signed)
Increase salt and fluid intake

## 2012-06-29 ENCOUNTER — Encounter: Payer: Self-pay | Admitting: Gastroenterology

## 2012-08-15 ENCOUNTER — Ambulatory Visit: Payer: Medicare PPO | Admitting: Internal Medicine

## 2012-09-22 ENCOUNTER — Encounter: Payer: Self-pay | Admitting: Gastroenterology

## 2012-10-12 ENCOUNTER — Telehealth: Payer: Self-pay | Admitting: *Deleted

## 2012-10-12 ENCOUNTER — Ambulatory Visit (AMBULATORY_SURGERY_CENTER): Payer: Self-pay | Admitting: *Deleted

## 2012-10-12 VITALS — Ht 67.0 in | Wt 180.8 lb

## 2012-10-12 DIAGNOSIS — Z86010 Personal history of colonic polyps: Secondary | ICD-10-CM

## 2012-10-12 MED ORDER — MOVIPREP 100 G PO SOLR: ORAL | Status: DC

## 2012-10-12 NOTE — Telephone Encounter (Signed)
Pt scheduled for OV with Dr. Christella Hartigan 10/27 at 3:45.  Pt and wife aware of appt.

## 2012-10-12 NOTE — Telephone Encounter (Signed)
Dr Christella Hartigan:  Pt scheduled for recall colon 10/8.  During PV pt requests to have EGD at same time as colonoscopy.  He says that he has been having epigastric pain for 2 months, denies dysphagia.  He says "it feels like food is not digesting."  Pt has not been taking Prilosec for about 1 year because it was not working. Last EGD 2013.  Please advise.  Thanks, Olegario Messier

## 2012-10-12 NOTE — Telephone Encounter (Signed)
She does not need another EGD.   If she has signficant problems, will need rov with me to discuss

## 2012-10-14 ENCOUNTER — Encounter: Payer: Self-pay | Admitting: Gastroenterology

## 2012-10-26 ENCOUNTER — Encounter: Payer: Self-pay | Admitting: Gastroenterology

## 2012-10-26 ENCOUNTER — Ambulatory Visit (AMBULATORY_SURGERY_CENTER): Payer: Medicare PPO | Admitting: Gastroenterology

## 2012-10-26 VITALS — BP 117/73 | HR 61 | Temp 97.2°F | Resp 19 | Ht 67.0 in | Wt 180.0 lb

## 2012-10-26 DIAGNOSIS — Z86010 Personal history of colonic polyps: Secondary | ICD-10-CM

## 2012-10-26 MED ORDER — SODIUM CHLORIDE 0.9 % IV SOLN: 500.0000 mL | INTRAVENOUS | Status: DC

## 2012-10-26 NOTE — Progress Notes (Signed)
Patient did not experience any of the following events: a burn prior to discharge; a fall within the facility; wrong site/side/patient/procedure/implant event; or a hospital transfer or hospital admission upon discharge from the facility. (G8907) Patient did not have preoperative order for IV antibiotic SSI prophylaxis. (G8918)  

## 2012-10-26 NOTE — Progress Notes (Signed)
The pt tolerated the colonoscopy very well. Maw   

## 2012-10-26 NOTE — Patient Instructions (Signed)
YOU HAD AN ENDOSCOPIC PROCEDURE TODAY AT THE [facility]: Refer to the procedure report that was given to you for any specific questions about what was found during the examination.  If the procedure report does not answer your questions, please call your gastroenterologist to clarify.  If you requested that your care partner not be given the details of your procedure findings, then the procedure report has been included in a sealed envelope for you to review at your convenience later.  YOU SHOULD EXPECT: Some feelings of bloating in the abdomen. Passage of more gas than usual.  Walking can help get rid of the air that was put into your GI tract during the procedure and reduce the bloating. If you had a lower endoscopy (such as a colonoscopy or flexible sigmoidoscopy) you may notice spotting of blood in your stool or on the toilet paper. If you underwent a bowel prep for your procedure, then you may not have a normal bowel movement for a few days.  DIET: Your first meal following the procedure should be a light meal and then it is ok to progress to your normal diet.  A half-sandwich or bowl of soup is an example of a good first meal.  Heavy or fried foods are harder to digest and may make you feel nauseous or bloated.  Likewise meals heavy in dairy and vegetables can cause extra gas to form and this can also increase the bloating.  Drink plenty of fluids but you should avoid alcoholic beverages for 24 hours.  ACTIVITY: Your care partner should take you home directly after the procedure.  You should plan to take it easy, moving slowly for the rest of the day.  You can resume normal activity the day after the procedure however you should NOT DRIVE or use heavy machinery for 24 hours (because of the sedation medicines used during the test).    SYMPTOMS TO REPORT IMMEDIATELY: A gastroenterologist can be reached at any hour.  During normal business hours, 8:30 AM to 5:00 PM Monday through Friday,  call [phone].  After hours and on weekends, please call the GI answering service at [phone] who will take a message and have the physician on call contact you.   Following lower endoscopy (colonoscopy or flexible sigmoidoscopy):  Excessive amounts of blood in the stool  Significant tenderness or worsening of abdominal pains  Swelling of the abdomen that is new, acute  Fever of 100F or higher  FOLLOW UP: If any biopsies were taken you will be contacted by phone or by letter within the next 1-3 weeks.  Call your gastroenterologist if you have not heard about the biopsies in 3 weeks.  Our staff will call the home number listed on your records the next business day following your procedure to check on you and address any questions or concerns that you may have at that time regarding the information given to you following your procedure. This is a courtesy call and so if there is no answer at the home number and we have not heard from you through the emergency physician on call, we will assume that you have returned to your regular daily activities without incident.  SIGNATURES/CONFIDENTIALITY: You and/or your care partner have signed paperwork which will be entered into your electronic medical record.  These signatures attest to the fact that that the information above on your After Visit Summary has been reviewed and is understood.  Full responsibility of the confidentiality of this   discharge information lies with you and/or your care-partner.  Resume medications. 

## 2012-10-26 NOTE — Op Note (Signed)
[facility] [address] [place], [address]   COLONOSCOPY PROCEDURE REPORT  PATIENT: Brandon, Bradford  MR#: [id number] BIRTHDATE: 05/22/1940 , 71  yrs. old GENDER: Male ENDOSCOPIST: Rachael Fee, MD PROCEDURE DATE:  10/26/2012 PROCEDURE:   Colonoscopy, surveillance First Screening Colonoscopy - Avg.  risk and is 50 yrs.  old or older - No.  Prior Negative Screening - Now for repeat screening. N/A  History of Adenoma - Now for follow-up colonoscopy & has been > or = to 3 yrs.  Yes hx of adenoma.  Has been 3 or more years since last colonoscopy.  Polyps Removed Today? No.  Recommend repeat exam, <10 yrs? Yes.  High risk (family or personal hx). ASA CLASS:   Class II INDICATIONS: adenomatous colon polyps: Initial colonoscopy in May 2011 by Dr.  Loman Chroman where he found 2 polyps that appeared unresectable to him, one was in the cecum and one was at the hepatic flexure.  Repeat colonoscopy by Dr.  Christella Hartigan July 2011 found the same and I recommended that he undergo right hemicolectomy.  He underwent that surgery August 2011 Dr. 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 Several nodes were removed none with cancer or neoplasia.Marland Kitchen MEDICATIONS: Fentanyl 50 mcg IV, Versed 5 mg IV, and These medications were titrated to patient response per physician's verbal order  DESCRIPTION OF PROCEDURE:   After the risks benefits and alternatives of the procedure were thoroughly explained, informed consent was obtained.  A digital rectal exam revealed no abnormalities of the rectum.   The [id number]  endoscope was introduced through the anus and advanced to the surgical anastomosis. No adverse events experienced.   The quality of the prep was excellent.  The instrument was then slowly withdrawn as the colon was fully examined.   COLON FINDINGS: The ileocolonic  anastomosis was normal appearing. The examination was also otherwise normal.  Retroflexed views revealed no abnormalities. The time to cecum=NA.  Withdrawal time=5 minutes 46 seconds.  The scope was withdrawn and the procedure completed. COMPLICATIONS: There were no complications.  ENDOSCOPIC IMPRESSION: The ileocolonic anastomosis was normal appearing. The examination was also otherwise normal.  No polyps or cancers  RECOMMENDATIONS: Given your personal history of adenomatous (pre-cancerous) polyps, you will need a repeat colonoscopy in 5 years.   eSigned:  Rachael Fee, MD 10/26/2012 11:24 AM   cc: Dr. Pearson Grippe, Dr. Marcille Blanco

## 2012-10-26 NOTE — Progress Notes (Signed)
No egg or soy allergy. emw 

## 2012-10-27 ENCOUNTER — Telehealth: Payer: Self-pay

## 2012-10-27 NOTE — Telephone Encounter (Signed)
  Follow up Call-  Call back number 10/26/2012 03/30/2011  Post procedure Call Back phone  # [phone]  Permission to leave phone message Yes Yes     Patient questions:  Do you have a fever, pain , or abdominal swelling? no Pain Score  0 *  Have you tolerated food without any problems? yes  Have you been able to return to your normal activities? yes  Do you have any questions about your discharge instructions: Diet   no Medications  no Follow up visit  no  Do you have questions or concerns about your Care? no  Actions: * If pain score is 4 or above: No action needed, pain <4.  Per the pt, "I am doing well". Maw

## 2012-11-14 ENCOUNTER — Ambulatory Visit: Payer: Medicare PPO | Admitting: Gastroenterology

## 2012-12-20 ENCOUNTER — Ambulatory Visit: Payer: Medicare PPO | Admitting: Gastroenterology

## 2013-02-01 DIAGNOSIS — I10 Essential (primary) hypertension: Secondary | ICD-10-CM | POA: Diagnosis not present

## 2013-02-01 DIAGNOSIS — E785 Hyperlipidemia, unspecified: Secondary | ICD-10-CM | POA: Diagnosis not present

## 2013-02-01 DIAGNOSIS — R7309 Other abnormal glucose: Secondary | ICD-10-CM | POA: Diagnosis not present

## 2013-02-01 DIAGNOSIS — R079 Chest pain, unspecified: Secondary | ICD-10-CM | POA: Diagnosis not present

## 2013-02-03 DIAGNOSIS — R079 Chest pain, unspecified: Secondary | ICD-10-CM | POA: Diagnosis not present

## 2013-02-03 DIAGNOSIS — R002 Palpitations: Secondary | ICD-10-CM | POA: Diagnosis not present

## 2013-02-03 DIAGNOSIS — E782 Mixed hyperlipidemia: Secondary | ICD-10-CM | POA: Diagnosis not present

## 2013-02-03 DIAGNOSIS — I10 Essential (primary) hypertension: Secondary | ICD-10-CM | POA: Diagnosis not present

## 2013-02-23 DIAGNOSIS — R002 Palpitations: Secondary | ICD-10-CM | POA: Diagnosis not present

## 2013-02-23 DIAGNOSIS — I10 Essential (primary) hypertension: Secondary | ICD-10-CM | POA: Diagnosis not present

## 2013-02-23 DIAGNOSIS — R079 Chest pain, unspecified: Secondary | ICD-10-CM | POA: Diagnosis not present

## 2013-02-27 DIAGNOSIS — I714 Abdominal aortic aneurysm, without rupture, unspecified: Secondary | ICD-10-CM | POA: Diagnosis not present

## 2013-03-06 DIAGNOSIS — H9319 Tinnitus, unspecified ear: Secondary | ICD-10-CM | POA: Diagnosis not present

## 2013-03-06 DIAGNOSIS — H903 Sensorineural hearing loss, bilateral: Secondary | ICD-10-CM | POA: Diagnosis not present

## 2013-03-10 DIAGNOSIS — I10 Essential (primary) hypertension: Secondary | ICD-10-CM | POA: Diagnosis not present

## 2013-03-10 DIAGNOSIS — R05 Cough: Secondary | ICD-10-CM | POA: Diagnosis not present

## 2013-03-10 DIAGNOSIS — R609 Edema, unspecified: Secondary | ICD-10-CM | POA: Diagnosis not present

## 2013-03-10 DIAGNOSIS — R059 Cough, unspecified: Secondary | ICD-10-CM | POA: Diagnosis not present

## 2013-03-15 DIAGNOSIS — I714 Abdominal aortic aneurysm, without rupture, unspecified: Secondary | ICD-10-CM | POA: Diagnosis not present

## 2013-03-15 DIAGNOSIS — R079 Chest pain, unspecified: Secondary | ICD-10-CM | POA: Diagnosis not present

## 2013-03-15 DIAGNOSIS — E785 Hyperlipidemia, unspecified: Secondary | ICD-10-CM | POA: Diagnosis not present

## 2013-03-17 ENCOUNTER — Other Ambulatory Visit: Payer: Self-pay | Admitting: Internal Medicine

## 2013-03-17 DIAGNOSIS — J329 Chronic sinusitis, unspecified: Secondary | ICD-10-CM | POA: Diagnosis not present

## 2013-03-17 DIAGNOSIS — M549 Dorsalgia, unspecified: Secondary | ICD-10-CM | POA: Diagnosis not present

## 2013-03-22 ENCOUNTER — Ambulatory Visit
Admission: RE | Admit: 2013-03-22 | Discharge: 2013-03-22 | Disposition: A | Discharge status: Home / Self Care | Payer: Medicare Other | Source: Ambulatory Visit | Attending: Internal Medicine | Admitting: Internal Medicine

## 2013-03-22 DIAGNOSIS — IMO0002 Reserved for concepts with insufficient information to code with codable children: Secondary | ICD-10-CM | POA: Diagnosis not present

## 2013-03-22 DIAGNOSIS — M549 Dorsalgia, unspecified: Secondary | ICD-10-CM

## 2013-03-29 ENCOUNTER — Inpatient Hospital Stay (HOSPITAL_COMMUNITY)
Admission: EM | Admit: 2013-03-29 | Discharge: 2013-03-31 | DRG: 872 | Disposition: A | Discharge status: Home / Self Care | Payer: Medicare Other | Attending: Internal Medicine | Admitting: Internal Medicine

## 2013-03-29 ENCOUNTER — Emergency Department (HOSPITAL_COMMUNITY): Payer: Medicare Other

## 2013-03-29 ENCOUNTER — Encounter (HOSPITAL_COMMUNITY): Payer: Self-pay | Admitting: Emergency Medicine

## 2013-03-29 DIAGNOSIS — K5289 Other specified noninfective gastroenteritis and colitis: Secondary | ICD-10-CM

## 2013-03-29 DIAGNOSIS — Z79899 Other long term (current) drug therapy: Secondary | ICD-10-CM | POA: Diagnosis not present

## 2013-03-29 DIAGNOSIS — Z9049 Acquired absence of other specified parts of digestive tract: Secondary | ICD-10-CM | POA: Diagnosis not present

## 2013-03-29 DIAGNOSIS — I10 Essential (primary) hypertension: Secondary | ICD-10-CM | POA: Diagnosis present

## 2013-03-29 DIAGNOSIS — N179 Acute kidney failure, unspecified: Secondary | ICD-10-CM | POA: Diagnosis not present

## 2013-03-29 DIAGNOSIS — I714 Abdominal aortic aneurysm, without rupture, unspecified: Secondary | ICD-10-CM | POA: Diagnosis present

## 2013-03-29 DIAGNOSIS — Z87891 Personal history of nicotine dependence: Secondary | ICD-10-CM | POA: Diagnosis not present

## 2013-03-29 DIAGNOSIS — R000 Tachycardia, unspecified: Secondary | ICD-10-CM | POA: Diagnosis not present

## 2013-03-29 DIAGNOSIS — A419 Sepsis, unspecified organism: Principal | ICD-10-CM | POA: Diagnosis present

## 2013-03-29 DIAGNOSIS — R142 Eructation: Secondary | ICD-10-CM | POA: Diagnosis present

## 2013-03-29 DIAGNOSIS — A0811 Acute gastroenteropathy due to Norwalk agent: Secondary | ICD-10-CM | POA: Diagnosis present

## 2013-03-29 DIAGNOSIS — R109 Unspecified abdominal pain: Secondary | ICD-10-CM | POA: Diagnosis not present

## 2013-03-29 DIAGNOSIS — K529 Noninfective gastroenteritis and colitis, unspecified: Secondary | ICD-10-CM | POA: Diagnosis present

## 2013-03-29 DIAGNOSIS — K219 Gastro-esophageal reflux disease without esophagitis: Secondary | ICD-10-CM | POA: Diagnosis present

## 2013-03-29 DIAGNOSIS — I498 Other specified cardiac arrhythmias: Secondary | ICD-10-CM | POA: Diagnosis not present

## 2013-03-29 DIAGNOSIS — E785 Hyperlipidemia, unspecified: Secondary | ICD-10-CM | POA: Diagnosis present

## 2013-03-29 DIAGNOSIS — R141 Gas pain: Secondary | ICD-10-CM | POA: Diagnosis present

## 2013-03-29 DIAGNOSIS — Z860100 Personal history of colon polyps, unspecified: Secondary | ICD-10-CM

## 2013-03-29 DIAGNOSIS — E860 Dehydration: Secondary | ICD-10-CM

## 2013-03-29 DIAGNOSIS — R143 Flatulence: Secondary | ICD-10-CM

## 2013-03-29 LAB — URINALYSIS, ROUTINE W REFLEX MICROSCOPIC
Glucose, UA: NEGATIVE mg/dL
HGB URINE DIPSTICK: NEGATIVE
KETONES UR: NEGATIVE mg/dL
Leukocytes, UA: NEGATIVE
Nitrite: NEGATIVE
Protein, ur: 30 mg/dL — AB
SPECIFIC GRAVITY, URINE: 1.027 (ref 1.005–1.030)
Urobilinogen, UA: 0.2 mg/dL (ref 0.0–1.0)
pH: 5 (ref 5.0–8.0)

## 2013-03-29 LAB — I-STAT CG4 LACTIC ACID, ED: Lactic Acid, Venous: 2.09 mmol/L (ref 0.5–2.2)

## 2013-03-29 LAB — I-STAT TROPONIN, ED: TROPONIN I, POC: 0.03 ng/mL (ref 0.00–0.08)

## 2013-03-29 LAB — URINE MICROSCOPIC-ADD ON

## 2013-03-29 LAB — COMPREHENSIVE METABOLIC PANEL
ALT: 32 U/L (ref 0–53)
AST: 37 U/L (ref 0–37)
Albumin: 4.6 g/dL (ref 3.5–5.2)
Alkaline Phosphatase: 66 U/L (ref 39–117)
BILIRUBIN TOTAL: 1.3 mg/dL — AB (ref 0.3–1.2)
BUN: 41 mg/dL — AB (ref 6–23)
CALCIUM: 9.7 mg/dL (ref 8.4–10.5)
CO2: 15 meq/L — AB (ref 19–32)
Chloride: 99 mEq/L (ref 96–112)
Creatinine, Ser: 1.82 mg/dL — ABNORMAL HIGH (ref 0.50–1.35)
GFR, EST AFRICAN AMERICAN: 41 mL/min — AB (ref >90)
GFR, EST NON AFRICAN AMERICAN: 35 mL/min — AB (ref >90)
GLUCOSE: 150 mg/dL — AB (ref 70–99)
Potassium: 4.2 mEq/L (ref 3.7–5.3)
Sodium: 137 mEq/L (ref 137–147)
Total Protein: 8.6 g/dL — ABNORMAL HIGH (ref 6.0–8.3)

## 2013-03-29 LAB — CBC WITH DIFFERENTIAL/PLATELET
Basophils Absolute: 0 10*3/uL (ref 0.0–0.1)
Basophils Relative: 0 % (ref 0–1)
EOS PCT: 0 % (ref 0–5)
Eosinophils Absolute: 0 10*3/uL (ref 0.0–0.7)
HEMATOCRIT: 47.3 % (ref 39.0–52.0)
Hemoglobin: 16.8 g/dL (ref 13.0–17.0)
LYMPHS ABS: 0.7 10*3/uL (ref 0.7–4.0)
Lymphocytes Relative: 7 % — ABNORMAL LOW (ref 12–46)
MCH: 32.4 pg (ref 26.0–34.0)
MCHC: 35.5 g/dL (ref 30.0–36.0)
MCV: 91.1 fL (ref 78.0–100.0)
MONO ABS: 0.7 10*3/uL (ref 0.1–1.0)
Monocytes Relative: 7 % (ref 3–12)
NEUTROS ABS: 9.5 10*3/uL — AB (ref 1.7–7.7)
Neutrophils Relative %: 87 % — ABNORMAL HIGH (ref 43–77)
Platelets: 246 10*3/uL (ref 150–400)
RBC: 5.19 MIL/uL (ref 4.22–5.81)
RDW: 12 % (ref 11.5–15.5)
WBC: 11 10*3/uL — ABNORMAL HIGH (ref 4.0–10.5)

## 2013-03-29 LAB — LIPASE, BLOOD: LIPASE: 24 U/L (ref 11–59)

## 2013-03-29 MED ORDER — ONDANSETRON HCL 4 MG/2ML IJ SOLN: 4.0000 mg | Freq: Three times a day (TID) | INTRAMUSCULAR | Status: AC | PRN

## 2013-03-29 MED ORDER — SENNA 8.6 MG PO TABS
1.0000 | ORAL_TABLET | Freq: Two times a day (BID) | ORAL | Status: DC
Start: 2013-03-29 — End: 2013-03-30
  Administered 2013-03-29: 8.6 mg via ORAL
  Filled 2013-03-29: qty 1

## 2013-03-29 MED ORDER — METRONIDAZOLE IN NACL 5-0.79 MG/ML-% IV SOLN
500.0000 mg | Freq: Three times a day (TID) | INTRAVENOUS | Status: DC
  Administered 2013-03-30 – 2013-03-31 (×5): 500 mg via INTRAVENOUS
  Filled 2013-03-29 (×6): qty 100

## 2013-03-29 MED ORDER — CIPROFLOXACIN IN D5W 400 MG/200ML IV SOLN
400.0000 mg | Freq: Two times a day (BID) | INTRAVENOUS | Status: DC
  Administered 2013-03-30 – 2013-03-31 (×2): 400 mg via INTRAVENOUS
  Filled 2013-03-29 (×3): qty 200

## 2013-03-29 MED ORDER — MORPHINE SULFATE 2 MG/ML IJ SOLN: 2.0000 mg | INTRAMUSCULAR | Status: DC | PRN

## 2013-03-29 MED ORDER — ATORVASTATIN CALCIUM 40 MG PO TABS
40.0000 mg | ORAL_TABLET | Freq: Every day | ORAL | Status: DC
  Administered 2013-03-30: 40 mg via ORAL
  Filled 2013-03-29 (×2): qty 1

## 2013-03-29 MED ORDER — PANCRELIPASE (LIP-PROT-AMYL) 12000-38000 UNITS PO CPEP
1.0000 | ORAL_CAPSULE | Freq: Three times a day (TID) | ORAL | Status: DC
  Administered 2013-03-30 – 2013-03-31 (×5): 1 via ORAL
  Filled 2013-03-29 (×7): qty 1

## 2013-03-29 MED ORDER — ACETAMINOPHEN 650 MG RE SUPP
650.0000 mg | Freq: Once | RECTAL | Status: AC
  Administered 2013-03-29: 650 mg via RECTAL
  Filled 2013-03-29: qty 1

## 2013-03-29 MED ORDER — SODIUM CHLORIDE 0.9 % IJ SOLN
3.0000 mL | Freq: Two times a day (BID) | INTRAMUSCULAR | Status: DC
  Administered 2013-03-29: 3 mL via INTRAVENOUS

## 2013-03-29 MED ORDER — DOCUSATE SODIUM 100 MG PO CAPS
100.0000 mg | ORAL_CAPSULE | Freq: Two times a day (BID) | ORAL | Status: DC
  Administered 2013-03-29: 100 mg via ORAL
  Filled 2013-03-29 (×4): qty 1

## 2013-03-29 MED ORDER — SODIUM CHLORIDE 0.9 % IV BOLUS (SEPSIS)
500.0000 mL | Freq: Once | INTRAVENOUS | Status: AC
  Administered 2013-03-29: 500 mL via INTRAVENOUS

## 2013-03-29 MED ORDER — METRONIDAZOLE IN NACL 5-0.79 MG/ML-% IV SOLN: 500.0000 mg | Freq: Once | INTRAVENOUS | Status: DC

## 2013-03-29 MED ORDER — ADULT MULTIVITAMIN W/MINERALS CH
1.0000 | ORAL_TABLET | Freq: Every day | ORAL | Status: DC
  Administered 2013-03-30 – 2013-03-31 (×2): 1 via ORAL
  Filled 2013-03-29 (×2): qty 1

## 2013-03-29 MED ORDER — ONDANSETRON HCL 4 MG/2ML IJ SOLN
4.0000 mg | Freq: Once | INTRAMUSCULAR | Status: AC
  Administered 2013-03-29: 4 mg via INTRAVENOUS
  Filled 2013-03-29: qty 2

## 2013-03-29 MED ORDER — SODIUM CHLORIDE 0.9 % IV SOLN
INTRAVENOUS | Status: AC
  Administered 2013-03-29 (×2): via INTRAVENOUS

## 2013-03-29 MED ORDER — ACETAMINOPHEN 325 MG PO TABS: 650.0000 mg | ORAL_TABLET | Freq: Four times a day (QID) | ORAL | Status: DC | PRN

## 2013-03-29 MED ORDER — IRBESARTAN 75 MG PO TABS
75.0000 mg | ORAL_TABLET | Freq: Every day | ORAL | Status: DC
  Filled 2013-03-29: qty 1

## 2013-03-29 MED ORDER — SODIUM CHLORIDE 0.9 % IV SOLN: INTRAVENOUS | Status: AC

## 2013-03-29 MED ORDER — METOPROLOL SUCCINATE ER 25 MG PO TB24
25.0000 mg | ORAL_TABLET | Freq: Every day | ORAL | Status: DC
  Administered 2013-03-30 – 2013-03-31 (×2): 25 mg via ORAL
  Filled 2013-03-29 (×2): qty 1

## 2013-03-29 MED ORDER — CIPROFLOXACIN IN D5W 400 MG/200ML IV SOLN
400.0000 mg | Freq: Once | INTRAVENOUS | Status: AC
  Administered 2013-03-29: 400 mg via INTRAVENOUS
  Filled 2013-03-29: qty 200

## 2013-03-29 MED ORDER — IOHEXOL 300 MG/ML  SOLN
80.0000 mL | Freq: Once | INTRAMUSCULAR | Status: AC | PRN
  Administered 2013-03-29: 80 mL via INTRAVENOUS

## 2013-03-29 MED ORDER — SODIUM CHLORIDE 0.9 % IV BOLUS (SEPSIS): 500.0000 mL | Freq: Once | INTRAVENOUS | Status: DC

## 2013-03-29 MED ORDER — SODIUM CHLORIDE 0.9 % IV SOLN
Freq: Once | INTRAVENOUS | Status: AC
Start: 2013-03-29 — End: 2013-03-29

## 2013-03-29 MED ORDER — IOHEXOL 300 MG/ML  SOLN
50.0000 mL | Freq: Once | INTRAMUSCULAR | Status: AC | PRN
  Administered 2013-03-29: 50 mL via ORAL

## 2013-03-29 MED ORDER — ENOXAPARIN SODIUM 40 MG/0.4ML ~~LOC~~ SOLN
40.0000 mg | Freq: Every day | SUBCUTANEOUS | Status: DC
  Administered 2013-03-29 – 2013-03-30 (×2): 40 mg via SUBCUTANEOUS
  Filled 2013-03-29 (×3): qty 0.4

## 2013-03-29 MED ORDER — HYDROCODONE-ACETAMINOPHEN 5-325 MG PO TABS: 1.0000 | ORAL_TABLET | ORAL | Status: DC | PRN

## 2013-03-29 MED ORDER — PANTOPRAZOLE SODIUM 40 MG PO TBEC
40.0000 mg | DELAYED_RELEASE_TABLET | Freq: Every day | ORAL | Status: DC
  Administered 2013-03-30 – 2013-03-31 (×2): 40 mg via ORAL
  Filled 2013-03-29 (×2): qty 1

## 2013-03-29 MED ORDER — MORPHINE SULFATE 4 MG/ML IJ SOLN
4.0000 mg | Freq: Once | INTRAMUSCULAR | Status: AC
  Administered 2013-03-29: 4 mg via INTRAVENOUS
  Filled 2013-03-29: qty 1

## 2013-03-29 MED ORDER — ACETAMINOPHEN 650 MG RE SUPP: 650.0000 mg | Freq: Four times a day (QID) | RECTAL | Status: DC | PRN

## 2013-03-29 MED ORDER — ONDANSETRON HCL 4 MG PO TABS: 4.0000 mg | ORAL_TABLET | Freq: Four times a day (QID) | ORAL | Status: DC | PRN

## 2013-03-29 MED ORDER — ONDANSETRON HCL 4 MG/2ML IJ SOLN: 4.0000 mg | Freq: Four times a day (QID) | INTRAMUSCULAR | Status: DC | PRN

## 2013-03-29 NOTE — ED Provider Notes (Signed)
CSN: [id number]     Arrival date & time 03/29/13  1524 History   First MD Initiated Contact with Patient 03/29/13 1621     Chief Complaint  Patient presents with  . Abdominal Pain     (Consider location/radiation/quality/duration/timing/severity/associated sxs/prior Treatment) HPI Brandon Bradford is a 73 y.o. male who presents to emergency department complaining of abdominal pain and swelling. Pt states his symptoms began last night. States feels bloaded. No bowel movement in two days. Admits to nausea, loss of appetite, no vomiting. Hx of SBO treated at [facility], also hemicolectomy, appendectomy. States this feels like when he had SBO. Pt denies any urinary symptoms. States has headache, and feels like his heart is racing. Did not take anything for his symptoms at home.   Past Medical History  Diagnosis Date  . Hypertension   . Hyperlipidemia   . Gastritis   . Colitis, ischemic   . Vertigo, intermittent   . Abnormal glucose 07/22/09    A1c 6.1  . Paroxysmal atrial fibrillation   . AAA (abdominal aortic aneurysm)     3.4 cm  . GERD (gastroesophageal reflux disease)   . Hx of colonic polyps    Past Surgical History  Procedure Laterality Date  . Appendectomy  1971  . Hemorroidectomy  2002  . Hemicolectomy      Right   Family History  Problem Relation Age of Onset  . Colon cancer Neg Hx    History  Substance Use Topics  . Smoking status: Former Smoker    Quit date: 10/13/1995  . Smokeless tobacco: Never Used  . Alcohol Use: No    Review of Systems  Constitutional: Negative for fever and chills.  Respiratory: Negative for cough, chest tightness and shortness of breath.   Cardiovascular: Negative for chest pain, palpitations and leg swelling.  Gastrointestinal: Positive for nausea, abdominal pain and abdominal distention. Negative for vomiting, diarrhea, constipation and blood in stool.  Genitourinary: Negative for dysuria, urgency, frequency and hematuria.  Musculoskeletal:  Negative for arthralgias, myalgias, neck pain and neck stiffness.  Skin: Negative for rash.  Allergic/Immunologic: Negative for immunocompromised state.  Neurological: Positive for weakness and headaches. Negative for dizziness, light-headedness and numbness.      Allergies  Review of patient's allergies indicates no known allergies.  Home Medications   Current Outpatient Rx  Name  Route  Sig  Dispense  Refill  . atorvastatin (LIPITOR) 40 MG tablet   Oral   Take 1 tablet (40 mg total) by mouth daily.   90 tablet   1   . metoprolol succinate (TOPROL-XL) 25 MG 24 hr tablet   Oral   Take 25 mg by mouth daily.          . Multiple Vitamin (MULTIVITAMIN) tablet   Oral   Take 1 tablet by mouth daily.         Marland Kitchen omeprazole (PRILOSEC) 40 MG capsule   Oral   Take 1 capsule (40 mg total) by mouth daily.   90 capsule   3   . Pancrelipase, Lip-Prot-Amyl, 24000 UNITS CPEP   Oral   Take 24,000 Units by mouth 3 (three) times daily as needed (only when patient get stomach cramps when eating or Trouble digesting).          . valsartan (DIOVAN) 40 MG tablet   Oral   Take 1 tablet (40 mg total) by mouth daily.   90 tablet   1    BP 126/81  Pulse 124  Temp(Src)  102 F (38.9 C) (Rectal)  Resp 20  SpO2 91% Physical Exam  Nursing note and vitals reviewed. Constitutional: He appears well-developed and well-nourished. No distress.  HENT:  Head: Normocephalic.  Eyes: Conjunctivae are normal.  Neck: Normal range of motion. Neck supple.  Cardiovascular: Normal rate, regular rhythm and normal heart sounds.   Pulmonary/Chest: Effort normal and breath sounds normal. No respiratory distress. He has no wheezes. He has no rales.  Abdominal: Soft. He exhibits distension. There is tenderness. There is no rebound and no guarding.  Hypoactive bowel sounds. No guarding or rebound tenderness. Abdomen diffusely tender. No CVA tenderness  Neurological: He is alert.  Skin: Skin is warm and  dry.    ED Course  Procedures (including critical care time) Labs Review Labs Reviewed  CBC WITH DIFFERENTIAL - Abnormal; Notable for the following:    WBC 11.0 (*)    Neutrophils Relative % 87 (*)    Neutro Abs 9.5 (*)    Lymphocytes Relative 7 (*)    All other components within normal limits  COMPREHENSIVE METABOLIC PANEL - Abnormal; Notable for the following:    CO2 15 (*)    Glucose, Bld 150 (*)    BUN 41 (*)    Creatinine, Ser 1.82 (*)    Total Protein 8.6 (*)    Total Bilirubin 1.3 (*)    GFR calc non Af Amer 35 (*)    GFR calc Af Amer 41 (*)    All other components within normal limits  URINALYSIS, ROUTINE W REFLEX MICROSCOPIC - Abnormal; Notable for the following:    Color, Urine AMBER (*)    APPearance CLOUDY (*)    Bilirubin Urine SMALL (*)    Protein, ur 30 (*)    All other components within normal limits  URINE MICROSCOPIC-ADD ON - Abnormal; Notable for the following:    Casts HYALINE CASTS (*)    All other components within normal limits  CULTURE, BLOOD (ROUTINE X 2)  CULTURE, BLOOD (ROUTINE X 2)  LIPASE, BLOOD  SODIUM, URINE, RANDOM  CREATININE, URINE, RANDOM  OSMOLALITY, URINE  GI PATHOGEN PANEL BY PCR, STOOL  BASIC METABOLIC PANEL  CBC  TSH  I-STAT TROPOININ, ED  I-STAT CG4 LACTIC ACID, ED   Imaging Review Ct Abdomen Pelvis W Contrast  03/29/2013   CLINICAL DATA Mid abdominal pain with nausea and vomiting. History of small bowel obstruction.  EXAM CT ABDOMEN AND PELVIS WITH CONTRAST  TECHNIQUE Multidetector CT imaging of the abdomen and pelvis was performed using the standard protocol following bolus administration of intravenous contrast.  CONTRAST 68mL OMNIPAQUE IOHEXOL 300 MG/ML  SOLN  COMPARISON DG CHEST 2V dated 03/10/2013  FINDINGS Lung Bases: Dependent atelectasis.  No airspace disease.  Liver: Tiny low-density lesions in the right hepatic lobe probably represent small cysts.  Spleen:  Old granulomatous disease.  Gallbladder:  Tiny calcified  gallstone in the neck.  Common bile duct:  Normal.  Pancreas:  Normal.  Adrenal glands:  Normal.  Kidneys: Right renal sinus cyst. Tiny low-density left renal lesion probably also represents a cyst. Normal renal enhancement. Both ureters are within normal limits. No renal calculi.  Stomach:  Normal.  Small bowel: Duodenum appears normal. There are distended loops of small bowel with decompression of the terminal ileum. No transition point or fecalization. Ileocolonic anastomosis appears within normal limits.  Colon: Ascending colectomy. Fluid is present throughout the colon, abnormal but nonspecific. No mural thickening or mass lesion. Fluid extends to the rectosigmoid. Mild thickening  of the rectal wall can be associated with prior mural inflammation. This extends into the distal sigmoid.  Pelvic Genitourinary: Prostatic calcifications. No free fluid. Urinary bladder is within normal limits.  Bones: Bilateral hip osteoarthritis. Lumbar spondylosis. No compression fracture or aggressive osseous lesion.  Vasculature: Atherosclerosis. Left common iliac artery ectasia. Infrarenal abdominal aortic aneurysm measures 36 mm.  Body Wall: Normal.  IMPRESSION 1. Nonspecific fluid levels within large and small bowel all. This may be associated with enteric infection. Distended but not dilated small bowel is present with decompression of the terminal ileum. No transition point is identified. 2. 36 mm infrarenal abdominal aortic aneurysm. Recommend follow up by [place] in 2years. This recommendation follows ACR consensus guidelines: White Paper of the ACR Incidental Findings Committee II on Vascular Findings. Joellyn Rued [id number]; [id number].  SIGNATURE  Electronically Signed   By: Dereck Ligas M.D.   On: 03/29/2013 19:16     EKG Interpretation   Date/Time:  Wednesday March 29 2013 16:09:52 EDT Ventricular Rate:  137 PR Interval:  125 QRS Duration: 87 QT Interval:  306 QTC Calculation: 462 R Axis:   -84 Text  Interpretation:  Sinus tachycardia Left anterior fascicular block  Consider right ventricular hypertrophy Left ventricular hypertrophy Since  previous tracing rate has increased Confirmed by Canary Brim  MD, MARTHA  [phone]) on 03/29/2013 11:57:10 PM      MDM   Final diagnoses:  Enteritis  Dehydration    pt with abdominal pain. Hx of ischemic colitis and SBO. Pt is in minimal pain at this time. Abdomen mildly distended. Fever of 102 rectally. Fluids and tylenol ordered.    6:07 PM Pt continues to be tachycardic. Will add more fluids. Lactic acid normal. Awaiting labs. CT scan pending. '   Patient CT scan is suspicious for enteric infection. Will start on Cipro and Flagyl IV. He continues to tachycardic, more fluids running. Based on the lab work, elevated creatinine and BUN, and physical exam, patient is dehydrated. Patient is feeling better. He states currently he "my abdomen feels numb." I discussed patient with [facility], they will admit. Added blood cultures, possible sepsis given her vital signs.  Filed Vitals:   03/29/13 1930 03/29/13 2126 03/29/13 2220 03/29/13 2349  BP: 174/84  141/82 126/65  Pulse: 126  123 105  Temp: 99.6 F (37.6 C) 98.7 F (37.1 C) 98.1 F (36.7 C) 98.8 F (37.1 C)  TempSrc: Oral Oral Oral Oral  Resp:   18 16  Height:   5\' 7"  (1.702 m)   Weight:   182 lb (82.555 kg)   SpO2: 92%  93% 94%     Florene Route Shatera Rennert, PA-C 03/30/13 0017

## 2013-03-29 NOTE — H&P (Addendum)
[facility] History and Physical  [name] QIO:[id number] DOB: 1940/02/09 DOA: 03/29/2013  Referring physician:  PCP: Jani Gravel, MD  Specialists:   Chief Complaint: Abdominal distention and pain  HPI: Brandon Bradford is a 73 y.o. male  History of hypertension, hyperlipidemia, small bowel obstruction that presents to the emergency department for abdominal pain and distention. Patient states that he started noticing this yesterday when his symptoms began. At this time he has no pain but does state he is bloated. He also states he has not had a bowel movement in 2 days, which is not normal for him. Patient denies any vomiting or nausea, or diarrhea. Patient did have a small bowel obstruction which was treated at [facility] in 2011. He thought he had another obstruction. Patient also states he's had a headache as well as his heart racing. He has not taken his medications today. He denies any travel or any recent ill contacts.  Review of Systems:  Constitutional: Admits to fever and chills. HEENT: Denies photophobia, eye pain, redness, hearing loss, ear pain, congestion, sore throat, rhinorrhea, sneezing, mouth sores, trouble swallowing, neck pain, neck stiffness and tinnitus.   Respiratory: Denies SOB, DOE, cough, chest tightness,  and wheezing.   Cardiovascular: Complains of his heart racing.  Denies chest pain. Gastrointestinal: Patient complains of abdominal distention, and constipation. Has had small bowel obstruction in the past.  Genitourinary: Denies dysuria, urgency, frequency, hematuria, flank pain and difficulty urinating.  Musculoskeletal: Denies myalgias, back pain, joint swelling, arthralgias and gait problem.  Skin: Denies pallor, rash and wound.  Neurological: Denies dizziness, seizures, syncope, weakness, light-headedness, numbness and headaches.  Hematological: Denies adenopathy. Easy bruising, personal or family bleeding history  Psychiatric/Behavioral: Denies suicidal ideation,  mood changes, confusion, nervousness, sleep disturbance and agitation  Past Medical History  Diagnosis Date  . Hypertension   . Hyperlipidemia   . Gastritis   . Colitis, ischemic   . Vertigo, intermittent   . Abnormal glucose 07/22/09    A1c 6.1  . Paroxysmal atrial fibrillation   . AAA (abdominal aortic aneurysm)     3.4 cm  . GERD (gastroesophageal reflux disease)   . Hx of colonic polyps    Past Surgical History  Procedure Laterality Date  . Appendectomy  1971  . Hemorroidectomy  2002  . Hemicolectomy      Right   Social History:  reports that he quit smoking about 17 years ago. He has never used smokeless tobacco. He reports that he does not drink alcohol or use illicit drugs. Currently married and lives at home with his wife.  No Known Allergies  Family History  Problem Relation Age of Onset  . Colon cancer Neg Hx     Prior to Admission medications   Medication Sig Start Date End Date Taking? Authorizing Provider  atorvastatin (LIPITOR) 40 MG tablet Take 1 tablet (40 mg total) by mouth daily. 10/05/11  Yes Doe-Hyun R Shawna Orleans, DO  metoprolol succinate (TOPROL-XL) 25 MG 24 hr tablet Take 25 mg by mouth daily.  09/22/12  Yes Historical Provider, MD  Multiple Vitamin (MULTIVITAMIN) tablet Take 1 tablet by mouth daily.   Yes Historical Provider, MD  omeprazole (PRILOSEC) 40 MG capsule Take 1 capsule (40 mg total) by mouth daily. 12/29/11  Yes Doe-Hyun R Shawna Orleans, DO  Pancrelipase, Lip-Prot-Amyl, 24000 UNITS CPEP Take 24,000 Units by mouth 3 (three) times daily as needed (only when patient get stomach cramps when eating or Trouble digesting).    Yes Historical Provider, MD  valsartan (DIOVAN) 40 MG tablet Take 1 tablet (40 mg total) by mouth daily. 06/07/12 06/07/13 Yes Doe-Hyun Kyra Searles, DO   Physical Exam: Filed Vitals:   03/29/13 1930  BP: 174/84  Pulse: 126  Temp: 99.6 F (37.6 C)  Resp:      General: Well developed, well nourished, NAD, appears stated age  HEENT: NCAT,  PERRLA, EOMI, Anicteic Sclera, mucous membranes moist.   Neck: Supple, no JVD, no masses  Cardiovascular: S1 S2 auscultated, tachycardic  Respiratory: Clear to auscultation bilaterally with equal chest rise  Abdomen: Soft, nontender, distended, + bowel sounds  Extremities: warm dry without cyanosis clubbing or edema  Neuro: AAOx3, cranial nerves grossly intact. Strength 5/5 in patient's upper and lower extremities bilaterally  Skin: Without rashes exudates or nodules  Psych: Normal affect and demeanor with intact judgement and insight  Labs on Admission:  Basic Metabolic Panel:  Recent Labs Lab 03/29/13 1708  NA 137  K 4.2  CL 99  CO2 15*  GLUCOSE 150*  BUN 41*  CREATININE 1.82*  CALCIUM 9.7   Liver Function Tests:  Recent Labs Lab 03/29/13 1708  AST 37  ALT 32  ALKPHOS 66  BILITOT 1.3*  PROT 8.6*  ALBUMIN 4.6    Recent Labs Lab 03/29/13 1708  LIPASE 24   No results found for this basename: AMMONIA,  in the last 168 hours CBC:  Recent Labs Lab 03/29/13 1708  WBC 11.0*  NEUTROABS 9.5*  HGB 16.8  HCT 47.3  MCV 91.1  PLT 246   Cardiac Enzymes: No results found for this basename: CKTOTAL, CKMB, CKMBINDEX, TROPONINI,  in the last 168 hours  BNP (last 3 results) No results found for this basename: PROBNP,  in the last 8760 hours CBG: No results found for this basename: GLUCAP,  in the last 168 hours  Radiological Exams on Admission: Ct Abdomen Pelvis W Contrast  03/29/2013   CLINICAL DATA Mid abdominal pain with nausea and vomiting. History of small bowel obstruction.  EXAM CT ABDOMEN AND PELVIS WITH CONTRAST  TECHNIQUE Multidetector CT imaging of the abdomen and pelvis was performed using the standard protocol following bolus administration of intravenous contrast.  CONTRAST 72mL OMNIPAQUE IOHEXOL 300 MG/ML  SOLN  COMPARISON DG CHEST 2V dated 03/10/2013  FINDINGS Lung Bases: Dependent atelectasis.  No airspace disease.  Liver: Tiny low-density  lesions in the right hepatic lobe probably represent small cysts.  Spleen:  Old granulomatous disease.  Gallbladder:  Tiny calcified gallstone in the neck.  Common bile duct:  Normal.  Pancreas:  Normal.  Adrenal glands:  Normal.  Kidneys: Right renal sinus cyst. Tiny low-density left renal lesion probably also represents a cyst. Normal renal enhancement. Both ureters are within normal limits. No renal calculi.  Stomach:  Normal.  Small bowel: Duodenum appears normal. There are distended loops of small bowel with decompression of the terminal ileum. No transition point or fecalization. Ileocolonic anastomosis appears within normal limits.  Colon: Ascending colectomy. Fluid is present throughout the colon, abnormal but nonspecific. No mural thickening or mass lesion. Fluid extends to the rectosigmoid. Mild thickening of the rectal wall can be associated with prior mural inflammation. This extends into the distal sigmoid.  Pelvic Genitourinary: Prostatic calcifications. No free fluid. Urinary bladder is within normal limits.  Bones: Bilateral hip osteoarthritis. Lumbar spondylosis. No compression fracture or aggressive osseous lesion.  Vasculature: Atherosclerosis. Left common iliac artery ectasia. Infrarenal abdominal aortic aneurysm measures 36 mm.  Body Wall: Normal.  IMPRESSION 1. Nonspecific  fluid levels within large and small bowel all. This may be associated with enteric infection. Distended but not dilated small bowel is present with decompression of the terminal ileum. No transition point is identified. 2. 36 mm infrarenal abdominal aortic aneurysm. Recommend follow up by [place] in 2years. This recommendation follows ACR consensus guidelines: White Paper of the ACR Incidental Findings Committee II on Vascular Findings. Joellyn Rued [id number].  SIGNATURE  Electronically Signed   By: Dereck Ligas M.D.   On: 03/29/2013 19:16    EKG: Independently reviewed. Sinus tachycardia, rate 137,  LAFB  Assessment/Plan  Sepsis secondary to enteritis  He should be admitted to the medical floor. Patient is febrile with tachycardia.  CT scan showed nonspecific fluid bubbles of large and small bowel which may be associated with enteric infection. Distention but not dilation of the small bowel is present with decompression of the terminal ileum but no transition point identified. Patient restarted on enteric antibiotics with the ciprofloxacin and Flagyl. Will place patient on clear liquids.  Will obtain blood cultures and stool for GI pathogen panel.  Patient may need reimaging in the morning if distention does not improve.  Abdominal pain Likely secondary to the above. Again patient may need reimaging in the morning if his distention does not resolve. No obstruction was noted on the CT. Patient does have a history small bowel obstruction.  Sinus tachycardia Likely secondary to sepsis. Will place patient on normal saline and continue to monitor. Patient will be placed on telemetry monitor. Will also obtain a TSH level.  Acute kidney injury Possibly secondary to dehydration versus medications. Creatinine currently 1.82, with a baseline of 1.2. Valsartan will be held. Will continue to monitor his creatinine and place him normal saline, as well as obtain urine electrolytes. If his creatinine is not improve, patient may need renal ultrasound and further evaluation.  Hypertension Will continue metoprolol.  Aspirin will be held as patient does have acute kidney injury.  Hyperlipidemia Continue atorvastatin.  GERD Continue PPI  Infrarenal abdominal aortic aneurysm Found on CT scan. 36 mm in size. Recommend followup and ultrasound in 2 years.  DVT prophylaxis: lovenox  Code Status: Full  Condition: Guarded   Family Communication: Wife at bedside. Admission, patients condition and plan of care including tests being ordered have been discussed with the patient and wife who indicate  understanding and agree with the plan and Code Status.  Disposition Plan: Admitted.    Time spent: 45 minutes  Josaphine Shimamoto D.O. [facility] Pager [phone]  If 7PM-7AM, please contact night-coverage [email or web address] Password [email or web address] 03/29/2013, 8:26 PM

## 2013-03-29 NOTE — ED Notes (Signed)
Pt has gone to the bathroom twice in the past 20 minutes.  Pt currently states it is diarrhea.

## 2013-03-29 NOTE — ED Notes (Signed)
Patient transported to CT 

## 2013-03-29 NOTE — ED Notes (Signed)
Pt is unable to provide urine at this time

## 2013-03-29 NOTE — Progress Notes (Signed)
ANTIBIOTIC CONSULT NOTE - INITIAL  Pharmacy Consult for Cipro Indication: enteritis  No Known Allergies  Patient Measurements:     Vital Signs: Temp: 98.7 F (37.1 C) (03/11 2126) Temp src: Oral (03/11 2126) BP: 174/84 mmHg (03/11 1930) Pulse Rate: 126 (03/11 1930) Intake/Output from previous day:   Intake/Output from this shift:    Labs:  Recent Labs  03/29/13 1708  WBC 11.0*  HGB 16.8  PLT 246  CREATININE 1.82*   The CrCl is unknown because both a height and weight (above a minimum accepted value) are required for this calculation. No results found for this basename: VANCOTROUGH, VANCOPEAK, VANCORANDOM, GENTTROUGH, GENTPEAK, GENTRANDOM, TOBRATROUGH, TOBRAPEAK, TOBRARND, AMIKACINPEAK, AMIKACINTROU, AMIKACIN,  in the last 72 hours   Microbiology: No results found for this or any previous visit (from the past 720 hour(s)).  Medical History: Past Medical History  Diagnosis Date  . Hypertension   . Hyperlipidemia   . Gastritis   . Colitis, ischemic   . Vertigo, intermittent   . Abnormal glucose 07/22/09    A1c 6.1  . Paroxysmal atrial fibrillation   . AAA (abdominal aortic aneurysm)     3.4 cm  . GERD (gastroesophageal reflux disease)   . Hx of colonic polyps      Assessment: [name] with history of ischemic colitis, SBO, hemicolectomy, appendectomy presents with abdominal pain and swelling.  Patient is tachycardic, febrile, with mild leukocytosis.  Cipro (per pharmacy) and Flagyl started for sepsis secondary to enteritis.  Last documented weight 81.6 kg on 10/26/12  SCr elevated at 1.82, CrCl~41 ml/min   Blood cultures sent.  Goal of Therapy:  Eradication of infection  Plan:  1.  Cipro 400 mg IV q12h. 2.  F/u Scr for dose adjustments, culture results, LOT, clinical course.  Brandon Bradford 03/29/2013,10:13 PM

## 2013-03-29 NOTE — ED Notes (Signed)
Pt states that he has had previous abdominal surgeries and had to have a blockage removed in 2011 and the pain that he is feeling is similar. Feels bloated. No BM today. Alert and oriented.

## 2013-03-30 DIAGNOSIS — K5289 Other specified noninfective gastroenteritis and colitis: Secondary | ICD-10-CM | POA: Diagnosis not present

## 2013-03-30 DIAGNOSIS — I10 Essential (primary) hypertension: Secondary | ICD-10-CM | POA: Diagnosis not present

## 2013-03-30 DIAGNOSIS — I498 Other specified cardiac arrhythmias: Secondary | ICD-10-CM | POA: Diagnosis not present

## 2013-03-30 DIAGNOSIS — N179 Acute kidney failure, unspecified: Secondary | ICD-10-CM | POA: Diagnosis not present

## 2013-03-30 LAB — GI PATHOGEN PANEL BY PCR, STOOL
C DIFFICILE TOXIN A/B: NEGATIVE
CAMPYLOBACTER BY PCR: NEGATIVE
Cryptosporidium by PCR: NEGATIVE
E coli (ETEC) LT/ST: NEGATIVE
E coli (STEC): NEGATIVE
E coli 0157 by PCR: NEGATIVE
G lamblia by PCR: NEGATIVE
Norovirus GI/GII: POSITIVE
ROTAVIRUS A BY PCR: NEGATIVE
SALMONELLA BY PCR: NEGATIVE
Shigella by PCR: NEGATIVE

## 2013-03-30 LAB — BASIC METABOLIC PANEL
BUN: 31 mg/dL — ABNORMAL HIGH (ref 6–23)
CALCIUM: 8.3 mg/dL — AB (ref 8.4–10.5)
CO2: 15 mEq/L — ABNORMAL LOW (ref 19–32)
CREATININE: 1.33 mg/dL (ref 0.50–1.35)
Chloride: 101 mEq/L (ref 96–112)
GFR calc Af Amer: 60 mL/min — ABNORMAL LOW (ref >90)
GFR calc non Af Amer: 52 mL/min — ABNORMAL LOW (ref >90)
Glucose, Bld: 116 mg/dL — ABNORMAL HIGH (ref 70–99)
Potassium: 3.9 mEq/L (ref 3.7–5.3)
Sodium: 135 mEq/L — ABNORMAL LOW (ref 137–147)

## 2013-03-30 LAB — CBC
HEMATOCRIT: 45 % (ref 39.0–52.0)
HEMOGLOBIN: 15.5 g/dL (ref 13.0–17.0)
MCH: 32.1 pg (ref 26.0–34.0)
MCHC: 34.4 g/dL (ref 30.0–36.0)
MCV: 93.2 fL (ref 78.0–100.0)
Platelets: 198 10*3/uL (ref 150–400)
RBC: 4.83 MIL/uL (ref 4.22–5.81)
RDW: 12.3 % (ref 11.5–15.5)
WBC: 2.9 10*3/uL — AB (ref 4.0–10.5)

## 2013-03-30 LAB — OSMOLALITY, URINE: Osmolality, Ur: 836 mOsm/kg (ref 390–1090)

## 2013-03-30 LAB — TSH: TSH: 0.473 u[IU]/mL (ref 0.350–4.500)

## 2013-03-30 LAB — SODIUM, URINE, RANDOM: Sodium, Ur: 34 mEq/L

## 2013-03-30 LAB — CREATININE, URINE, RANDOM: Creatinine, Urine: 164.6 mg/dL

## 2013-03-30 NOTE — ED Provider Notes (Signed)
Medical screening examination/treatment/procedure(s) were performed by non-physician practitioner and as supervising physician I was immediately available for consultation/collaboration.   EKG Interpretation   Date/Time:  Wednesday March 29 2013 16:09:52 EDT Ventricular Rate:  137 PR Interval:  125 QRS Duration: 87 QT Interval:  306 QTC Calculation: 462 R Axis:   -84 Text Interpretation:  Sinus tachycardia Left anterior fascicular block  Consider right ventricular hypertrophy Left ventricular hypertrophy Since  previous tracing rate has increased Confirmed by Canary Brim  MD, [place]  [id number]) on 03/29/2013 11:57:10 PM       Threasa Beards, MD 03/30/13 1513

## 2013-03-30 NOTE — Progress Notes (Signed)
[facility] PROGRESS NOTE  Brandon Bradford [id number] DOB: 1940/12/11 DOA: 03/29/2013  PCP: Brandon Gravel, MD  Brief HPI: Brandon Bradford is a 73 y.o. male with past medical history of small bowel obstruction who presented to the emergency department with abdominal pain and distention. CT showed findings suggestive of enteritis.  Past medical history:  Past Medical History  Diagnosis Date  . Hypertension   . Hyperlipidemia   . Gastritis   . Colitis, ischemic   . Vertigo, intermittent   . Abnormal glucose 07/22/09    A1c 6.1  . Paroxysmal atrial fibrillation   . AAA (abdominal aortic aneurysm)     3.4 cm  . GERD (gastroesophageal reflux disease)   . Hx of colonic polyps     Consultants: None  Procedures: None  Antibiotics: Cipro/Flagyl 3/11-->  Subjective: Patient continues to have diarrhea which started last night. Denies any pain per se. No blood in stool. Bloating is better. No nausea or vomiting.  Objective: Vital Signs  Filed Vitals:   03/29/13 2126 03/29/13 2220 03/29/13 2349 03/30/13 0421  BP:  141/82 126/65 145/83  Pulse:  123 105 110  Temp: 98.7 F (37.1 C) 98.1 F (36.7 C) 98.8 F (37.1 C) 100.1 F (37.8 C)  TempSrc: Oral Oral Oral Oral  Resp:  18 16 18   Height:  5\' 7"  (1.702 m)    Weight:  82.555 kg (182 lb)    SpO2:  93% 94% 97%    Intake/Output Summary (Last 24 hours) at 03/30/13 0801 Last data filed at 03/30/13 0400  Gross per 24 hour  Intake  727.5 ml  Output      0 ml  Net  727.5 ml   Filed Weights   03/29/13 2220  Weight: 82.555 kg (182 lb)    General appearance: alert, cooperative, appears stated age and no distress Head: Normocephalic, without obvious abnormality, atraumatic Throat: lips, mucosa, and tongue normal; teeth and gums normal Resp: clear to auscultation bilaterally Cardio: regular rate and rhythm, S1, S2 normal, no murmur, click, rub or gallop GI: soft, non-tender; bowel sounds normal; no masses,  no  organomegaly Extremities: extremities normal, atraumatic, no cyanosis or edema Pulses: 2+ and symmetric Skin: Skin color, texture, turgor normal. No rashes or lesions Neurologic: No focal deficits  Lab Results:  Basic Metabolic Panel:  Recent Labs Lab 03/29/13 1708 03/30/13 0340  NA 137 135*  K 4.2 3.9  CL 99 101  CO2 15* 15*  GLUCOSE 150* 116*  BUN 41* 31*  CREATININE 1.82* 1.33  CALCIUM 9.7 8.3*   Liver Function Tests:  Recent Labs Lab 03/29/13 1708  AST 37  ALT 32  ALKPHOS 66  BILITOT 1.3*  PROT 8.6*  ALBUMIN 4.6    Recent Labs Lab 03/29/13 1708  LIPASE 24   CBC:  Recent Labs Lab 03/29/13 1708 03/30/13 0340  WBC 11.0* 2.9*  NEUTROABS 9.5*  --   HGB 16.8 15.5  HCT 47.3 45.0  MCV 91.1 93.2  PLT 246 198    Studies/Results: Ct Abdomen Pelvis W Contrast  03/29/2013   CLINICAL DATA Mid abdominal pain with nausea and vomiting. History of small bowel obstruction.  EXAM CT ABDOMEN AND PELVIS WITH CONTRAST  TECHNIQUE Multidetector CT imaging of the abdomen and pelvis was performed using the standard protocol following bolus administration of intravenous contrast.  CONTRAST 69mL OMNIPAQUE IOHEXOL 300 MG/ML  SOLN  COMPARISON DG CHEST 2V dated 03/10/2013  FINDINGS Lung Bases: Dependent atelectasis.  No airspace disease.  Liver:  Tiny low-density lesions in the right hepatic lobe probably represent small cysts.  Spleen:  Old granulomatous disease.  Gallbladder:  Tiny calcified gallstone in the neck.  Common bile duct:  Normal.  Pancreas:  Normal.  Adrenal glands:  Normal.  Kidneys: Right renal sinus cyst. Tiny low-density left renal lesion probably also represents a cyst. Normal renal enhancement. Both ureters are within normal limits. No renal calculi.  Stomach:  Normal.  Small bowel: Duodenum appears normal. There are distended loops of small bowel with decompression of the terminal ileum. No transition point or fecalization. Ileocolonic anastomosis appears within normal  limits.  Colon: Ascending colectomy. Fluid is present throughout the colon, abnormal but nonspecific. No mural thickening or mass lesion. Fluid extends to the rectosigmoid. Mild thickening of the rectal wall can be associated with prior mural inflammation. This extends into the distal sigmoid.  Pelvic Genitourinary: Prostatic calcifications. No free fluid. Urinary bladder is within normal limits.  Bones: Bilateral hip osteoarthritis. Lumbar spondylosis. No compression fracture or aggressive osseous lesion.  Vasculature: Atherosclerosis. Left common iliac artery ectasia. Infrarenal abdominal aortic aneurysm measures 36 mm.  Body Wall: Normal.  IMPRESSION 1. Nonspecific fluid levels within large and small bowel all. This may be associated with enteric infection. Distended but not dilated small bowel is present with decompression of the terminal ileum. No transition point is identified. 2. 36 mm infrarenal abdominal aortic aneurysm. Recommend follow up by [place] in 2years. This recommendation follows ACR consensus guidelines: White Paper of the ACR Incidental Findings Committee II on Vascular Findings. Joellyn Rued [id number]; 69:[id number].  SIGNATURE  Electronically Signed   By: Dereck Ligas M.D.   On: 03/29/2013 19:16    Medications:  Scheduled: . sodium chloride   Intravenous STAT  . atorvastatin  40 mg Oral Daily  . ciprofloxacin  400 mg Intravenous Q12H  . enoxaparin (LOVENOX) injection  40 mg Subcutaneous QHS  . lipase/protease/amylase  1 capsule Oral TID AC  . metoprolol succinate  25 mg Oral Daily  . metronidazole  500 mg Intravenous Once  . metronidazole  500 mg Intravenous 3 times per day  . multivitamin with minerals  1 tablet Oral Daily  . pantoprazole  40 mg Oral Daily  . senna  1 tablet Oral BID  . sodium chloride  500 mL Intravenous Once  . sodium chloride  3 mL Intravenous Q12H   Continuous: . sodium chloride 75 mL/hr at 03/29/13 2343   VOJ:[id number], acetaminophen,  HYDROcodone-acetaminophen, morphine injection, ondansetron (ZOFRAN) IV, ondansetron (ZOFRAN) IV, ondansetron  Assessment/Plan:  Principal Problem:   Sepsis Active Problems:   HYPERLIPIDEMIA   HYPERTENSION   GERD   Enteritis   AKI (acute kidney injury)    Enteritis  Started having diarrhea last night which has continued. Await stool studies. Continue Cipro/Flagyl. Clear liquids. Stop Colace.  Fever Likely due to above. Monitor for now. Blood cultures were drawn yesterday.  Leukopenia Likely from infection. Monitor for now.  Sinus tachycardia  Likely secondary to fever and dehydration.   Acute kidney injury  Possibly secondary to dehydration versus medications. Creatinine improved today. Hold Valsartan and other nephrotoxic agents.   Hypertension  Continue metoprolol. Monitor BP.  Hyperlipidemia  Continue atorvastatin.   GERD  Continue PPI   Infrarenal abdominal aortic aneurysm  Found on CT scan. 36 mm in size. Recommend followup and ultrasound in 2 years. PCP to follow up.  Code Status: Full Code  DVT Prophylaxis: Lovenox    Family Communication: Discussed with patient  Disposition  Plan: Home when better    LOS: 1 day   [facility] Pager [phone] 03/30/2013, 8:01 AM  If 8PM-8AM, please contact night-coverage at [email or web address], password [facility]

## 2013-03-31 DIAGNOSIS — A0811 Acute gastroenteropathy due to Norwalk agent: Secondary | ICD-10-CM

## 2013-03-31 LAB — COMPREHENSIVE METABOLIC PANEL
ALK PHOS: 41 U/L (ref 39–117)
ALT: 23 U/L (ref 0–53)
AST: 37 U/L (ref 0–37)
Albumin: 3.1 g/dL — ABNORMAL LOW (ref 3.5–5.2)
BUN: 21 mg/dL (ref 6–23)
CO2: 19 meq/L (ref 19–32)
Calcium: 8.1 mg/dL — ABNORMAL LOW (ref 8.4–10.5)
Chloride: 107 mEq/L (ref 96–112)
Creatinine, Ser: 1.21 mg/dL (ref 0.50–1.35)
GFR, EST AFRICAN AMERICAN: 67 mL/min — AB (ref >90)
GFR, EST NON AFRICAN AMERICAN: 58 mL/min — AB (ref >90)
GLUCOSE: 86 mg/dL (ref 70–99)
Potassium: 3.7 mEq/L (ref 3.7–5.3)
Sodium: 139 mEq/L (ref 137–147)
TOTAL PROTEIN: 5.9 g/dL — AB (ref 6.0–8.3)
Total Bilirubin: 0.8 mg/dL (ref 0.3–1.2)

## 2013-03-31 LAB — CBC
HEMATOCRIT: 40.1 % (ref 39.0–52.0)
HEMOGLOBIN: 13.5 g/dL (ref 13.0–17.0)
MCH: 31.5 pg (ref 26.0–34.0)
MCHC: 33.7 g/dL (ref 30.0–36.0)
MCV: 93.7 fL (ref 78.0–100.0)
Platelets: 178 10*3/uL (ref 150–400)
RBC: 4.28 MIL/uL (ref 4.22–5.81)
RDW: 12.4 % (ref 11.5–15.5)
WBC: 3.5 10*3/uL — ABNORMAL LOW (ref 4.0–10.5)

## 2013-03-31 MED ORDER — CIPROFLOXACIN HCL 500 MG PO TABS: 500.0000 mg | ORAL_TABLET | Freq: Two times a day (BID) | ORAL | Status: DC

## 2013-03-31 MED ORDER — HYDROCODONE-ACETAMINOPHEN 5-325 MG PO TABS: 1.0000 | ORAL_TABLET | ORAL | Status: DC | PRN

## 2013-03-31 MED ORDER — CIPROFLOXACIN HCL 500 MG PO TABS
500.0000 mg | ORAL_TABLET | Freq: Two times a day (BID) | ORAL | Status: DC
  Administered 2013-03-31: 500 mg via ORAL
  Filled 2013-03-31 (×3): qty 1

## 2013-03-31 MED ORDER — ONDANSETRON 4 MG PO TBDP: 4.0000 mg | ORAL_TABLET | Freq: Three times a day (TID) | ORAL | Status: DC | PRN

## 2013-03-31 NOTE — Discharge Summary (Signed)
[facility]  Physician Discharge Summary   Patient ID: Brandon Bradford MRN: [id number] DOB/AGE: 1940/12/22 73 y.o.  Admit date: 03/29/2013 Discharge date: 03/31/2013  PCP: Jani Gravel, MD  DISCHARGE DIAGNOSES:  Principal Problem:   Enteritis due to Norovirus Active Problems:   HYPERLIPIDEMIA   HYPERTENSION   GERD   Enteritis   Sepsis   AKI (acute kidney injury)   RECOMMENDATIONS FOR OUTPATIENT FOLLOW UP: 1. Needs follow up nest week.  2. Incidental AAA noted on CT. Please follow recommendations.  DISCHARGE CONDITION: fair  Diet recommendation: Bland diet for 4 days, then as before  [employer]   03/29/13 2220  Weight: 82.555 kg (182 lb)    INITIAL HISTORY: Brandon Bradford is a 73 y.o. male with past medical history of small bowel obstruction who presented to the emergency department with abdominal pain and distention. CT showed findings suggestive of enteritis.  Consultations:  None  Procedures:  None   HOSPITAL COURSE:   Enteritis Due to Norovirus Patient initially had abdominal bloating followed by onset of diarrhea. His bloating has subsided. Diarrhea seems to be improving. His fever has subsided. Stool studies show Norovirus which is responsible for his symptoms. He reports eating seafood at a restaurant a few days prior to onset of illness. He was initially placed on Cipro and flagyl. C diff is negative. Flagyl will be stopped. Cipro for few more days. Advance diet today and if he tolerates he can go home as he desires. He does have previous history of colonic polyp in 2011 status post hemicolectomy followed by SBO requiring admission to [facility]. Those issues seems stable with no recurrence.  Fever  Likely due to above. Resolving. Blood cultures negative so far.    Leukopenia  Likely from viral infection. Improving.   Sinus tachycardia  Resolved. Was likely secondary to fever and dehydration.   Acute kidney injury  Resolved. Was secondary to dehydration. We  held Valsartan and other nephrotoxic agents. He may resume now.   Hypertension  Continue home medications.  Hyperlipidemia  Continue atorvastatin.   GERD  Continue PPI   Infrarenal abdominal aortic aneurysm  Found on CT scan. 36 mm in size. Recommend followup and ultrasound in 2 years. PCP to follow up.   History of Paroxysmal Atrial Fibrillation Remote history. Not noted this hospital stay. Low [place] score and was on aspirin in past. Will let PCP handle same.  Overall he has improved. Diarrhea is very minimal now. He wants to go home. Diet will be advanced today and if he tolerates he should be able to go home later today. Discussed with him and his wife.   PERTINENT LABS:  The results of significant diagnostics from this hospitalization (including imaging, microbiology, ancillary and laboratory) are listed below for reference.    Microbiology: Final blood culture reports pending.  Labs: Basic Metabolic Panel:  Recent Labs Lab 03/29/13 1708 03/30/13 0340 03/31/13 0335  NA 137 135* 139  K 4.2 3.9 3.7  CL 99 101 107  CO2 15* 15* 19  GLUCOSE 150* 116* 86  BUN 41* 31* 21  CREATININE 1.82* 1.33 1.21  CALCIUM 9.7 8.3* 8.1*   Liver Function Tests:  Recent Labs Lab 03/29/13 1708 03/31/13 0335  AST 37 37  ALT 32 23  ALKPHOS 66 41  BILITOT 1.3* 0.8  PROT 8.6* 5.9*  ALBUMIN 4.6 3.1*    Recent Labs Lab 03/29/13 1708  LIPASE 24   CBC:  Recent Labs Lab 03/29/13 1708 03/30/13 0340 03/31/13 0335  WBC 11.0* 2.9* 3.5*  NEUTROABS 9.5*  --   --   HGB 16.8 15.5 13.5  HCT 47.3 45.0 40.1  MCV 91.1 93.2 93.7  PLT 246 198 178    IMAGING STUDIES  Ct Abdomen Pelvis W Contrast  03/29/2013   CLINICAL DATA Mid abdominal pain with nausea and vomiting. History of small bowel obstruction.  EXAM CT ABDOMEN AND PELVIS WITH CONTRAST  TECHNIQUE Multidetector CT imaging of the abdomen and pelvis was performed using the standard protocol following bolus administration of  intravenous contrast.  CONTRAST 21mL OMNIPAQUE IOHEXOL 300 MG/ML  SOLN  COMPARISON DG CHEST 2V dated 03/10/2013  FINDINGS Lung Bases: Dependent atelectasis.  No airspace disease.  Liver: Tiny low-density lesions in the right hepatic lobe probably represent small cysts.  Spleen:  Old granulomatous disease.  Gallbladder:  Tiny calcified gallstone in the neck.  Common bile duct:  Normal.  Pancreas:  Normal.  Adrenal glands:  Normal.  Kidneys: Right renal sinus cyst. Tiny low-density left renal lesion probably also represents a cyst. Normal renal enhancement. Both ureters are within normal limits. No renal calculi.  Stomach:  Normal.  Small bowel: Duodenum appears normal. There are distended loops of small bowel with decompression of the terminal ileum. No transition point or fecalization. Ileocolonic anastomosis appears within normal limits.  Colon: Ascending colectomy. Fluid is present throughout the colon, abnormal but nonspecific. No mural thickening or mass lesion. Fluid extends to the rectosigmoid. Mild thickening of the rectal wall can be associated with prior mural inflammation. This extends into the distal sigmoid.  Pelvic Genitourinary: Prostatic calcifications. No free fluid. Urinary bladder is within normal limits.  Bones: Bilateral hip osteoarthritis. Lumbar spondylosis. No compression fracture or aggressive osseous lesion.  Vasculature: Atherosclerosis. Left common iliac artery ectasia. Infrarenal abdominal aortic aneurysm measures 36 mm.  Body Wall: Normal.  IMPRESSION 1. Nonspecific fluid levels within large and small bowel all. This may be associated with enteric infection. Distended but not dilated small bowel is present with decompression of the terminal ileum. No transition point is identified. 2. 36 mm infrarenal abdominal aortic aneurysm. Recommend follow up by [place] in 2years. This recommendation follows ACR consensus guidelines: White Paper of the ACR Incidental Findings Committee II on Vascular  Findings. Joellyn Rued [id number]; [id number].  SIGNATURE  Electronically Signed   By: Dereck Ligas M.D.   On: 03/29/2013 19:16    DISCHARGE EXAMINATION: Filed Vitals:   03/30/13 0421 03/30/13 1330 03/30/13 2128 03/31/13 0451  BP: 145/83 158/96 139/85 127/76  Pulse: 110 99 80 70  Temp: 100.1 F (37.8 C) 98.3 F (36.8 C) 98.2 F (36.8 C) 97.7 F (36.5 C)  TempSrc: Oral Oral Oral Oral  Resp: 18 20 18 18   Height:      Weight:      SpO2: 97% 95% 94% 94%   General appearance: alert, cooperative, appears stated age and no distress Resp: clear to auscultation bilaterally Cardio: regular rate and rhythm, S1, S2 normal, no murmur, click, rub or gallop GI: soft, non-tender; bowel sounds normal; no masses,  no organomegaly Neurologic: Alert and oriented X 3, normal strength and tone. Normal symmetric reflexes. Normal coordination and gait  DISPOSITION: Home with wife  Discharge Orders   Future Orders Complete By Expires   Discharge diet:  As directed    Comments:     Bland diet for 4 days and then advance to usual diet.   Discharge instructions  As directed    Comments:  Please follow up with Dr. Maudie Mercury next week   Increase activity slowly  As directed      ALLERGIES: No Known Allergies  Current Discharge Medication List    START taking these medications   Details  ciprofloxacin (CIPRO) 500 MG tablet Take 1 tablet (500 mg total) by mouth 2 (two) times daily. Qty: 10 tablet, Refills: 0    HYDROcodone-acetaminophen (NORCO/VICODIN) 5-325 MG per tablet Take 1-2 tablets by mouth every 4 (four) hours as needed for moderate pain. Qty: 15 tablet, Refills: 0    ondansetron (ZOFRAN-ODT) 4 MG disintegrating tablet Take 1 tablet (4 mg total) by mouth every 8 (eight) hours as needed for nausea or vomiting. Qty: 20 tablet, Refills: 0      CONTINUE these medications which have NOT CHANGED   Details  atorvastatin (LIPITOR) 40 MG tablet Take 1 tablet (40 mg total) by mouth daily. Qty: 90  tablet, Refills: 1    metoprolol succinate (TOPROL-XL) 25 MG 24 hr tablet Take 25 mg by mouth daily.     Multiple Vitamin (MULTIVITAMIN) tablet Take 1 tablet by mouth daily.    omeprazole (PRILOSEC) 40 MG capsule Take 1 capsule (40 mg total) by mouth daily. Qty: 90 capsule, Refills: 3    Pancrelipase, Lip-Prot-Amyl, 24000 UNITS CPEP Take 24,000 Units by mouth 3 (three) times daily as needed (only when patient get stomach cramps when eating or Trouble digesting).     valsartan (DIOVAN) 40 MG tablet Take 1 tablet (40 mg total) by mouth daily. Qty: 90 tablet, Refills: 1       Follow-up Information   Schedule an appointment as soon as possible for a visit with Jani Gravel, MD. (post hospitalization follow up)    Specialty:  Internal Medicine   Contact information:   [address] [place] [address] [phone]       TOTAL DISCHARGE TIME: 34 mins  [facility] Pager [phone]  03/31/2013, 11:44 AM

## 2013-03-31 NOTE — Discharge Instructions (Signed)

## 2013-04-03 DIAGNOSIS — N179 Acute kidney failure, unspecified: Secondary | ICD-10-CM | POA: Diagnosis not present

## 2013-04-03 DIAGNOSIS — R109 Unspecified abdominal pain: Secondary | ICD-10-CM | POA: Diagnosis not present

## 2013-04-03 DIAGNOSIS — I10 Essential (primary) hypertension: Secondary | ICD-10-CM | POA: Diagnosis not present

## 2013-04-03 DIAGNOSIS — A419 Sepsis, unspecified organism: Secondary | ICD-10-CM | POA: Diagnosis not present

## 2013-04-05 LAB — CULTURE, BLOOD (ROUTINE X 2)
Culture: NO GROWTH
Culture: NO GROWTH

## 2013-04-06 ENCOUNTER — Telehealth: Payer: Self-pay | Admitting: Gastroenterology

## 2013-04-06 NOTE — Telephone Encounter (Signed)
Spoke with pts wife and she states the pt is having bad abdominal pain and they request to see Dr. Ardis Hughs asap. Let her know Dr. Ardis Hughs 1st available appt is 05/19/13. States they cannot wait that long and appt with PA offered. Pt does not want to see a midlevel, only the doctor. Pts wife states they will call back if they change their mind and want to be seen by the PA.

## 2013-06-19 DIAGNOSIS — R7309 Other abnormal glucose: Secondary | ICD-10-CM | POA: Diagnosis not present

## 2013-06-19 DIAGNOSIS — I10 Essential (primary) hypertension: Secondary | ICD-10-CM | POA: Diagnosis not present

## 2013-06-28 DIAGNOSIS — M25579 Pain in unspecified ankle and joints of unspecified foot: Secondary | ICD-10-CM | POA: Diagnosis not present

## 2013-06-28 DIAGNOSIS — R7309 Other abnormal glucose: Secondary | ICD-10-CM | POA: Diagnosis not present

## 2013-06-28 DIAGNOSIS — E785 Hyperlipidemia, unspecified: Secondary | ICD-10-CM | POA: Diagnosis not present

## 2013-06-28 DIAGNOSIS — I10 Essential (primary) hypertension: Secondary | ICD-10-CM | POA: Diagnosis not present

## 2013-06-28 DIAGNOSIS — M79609 Pain in unspecified limb: Secondary | ICD-10-CM | POA: Diagnosis not present

## 2013-08-28 DIAGNOSIS — I714 Abdominal aortic aneurysm, without rupture, unspecified: Secondary | ICD-10-CM | POA: Diagnosis not present

## 2013-09-20 DIAGNOSIS — I714 Abdominal aortic aneurysm, without rupture, unspecified: Secondary | ICD-10-CM | POA: Diagnosis not present

## 2013-09-20 DIAGNOSIS — E785 Hyperlipidemia, unspecified: Secondary | ICD-10-CM | POA: Diagnosis not present

## 2013-09-20 DIAGNOSIS — I10 Essential (primary) hypertension: Secondary | ICD-10-CM | POA: Diagnosis not present

## 2013-10-10 DIAGNOSIS — R7309 Other abnormal glucose: Secondary | ICD-10-CM | POA: Diagnosis not present

## 2013-10-10 DIAGNOSIS — I10 Essential (primary) hypertension: Secondary | ICD-10-CM | POA: Diagnosis not present

## 2013-10-27 DIAGNOSIS — Z23 Encounter for immunization: Secondary | ICD-10-CM | POA: Diagnosis not present

## 2013-11-07 DIAGNOSIS — I10 Essential (primary) hypertension: Secondary | ICD-10-CM | POA: Diagnosis not present

## 2013-11-07 DIAGNOSIS — Z125 Encounter for screening for malignant neoplasm of prostate: Secondary | ICD-10-CM | POA: Diagnosis not present

## 2013-11-07 DIAGNOSIS — M79674 Pain in right toe(s): Secondary | ICD-10-CM | POA: Diagnosis not present

## 2013-11-07 DIAGNOSIS — E785 Hyperlipidemia, unspecified: Secondary | ICD-10-CM | POA: Diagnosis not present

## 2014-01-29 DIAGNOSIS — I10 Essential (primary) hypertension: Secondary | ICD-10-CM | POA: Diagnosis not present

## 2014-01-29 DIAGNOSIS — R109 Unspecified abdominal pain: Secondary | ICD-10-CM | POA: Diagnosis not present

## 2014-01-29 DIAGNOSIS — R739 Hyperglycemia, unspecified: Secondary | ICD-10-CM | POA: Diagnosis not present

## 2014-01-29 DIAGNOSIS — E785 Hyperlipidemia, unspecified: Secondary | ICD-10-CM | POA: Diagnosis not present

## 2014-02-14 DIAGNOSIS — R209 Unspecified disturbances of skin sensation: Secondary | ICD-10-CM | POA: Diagnosis not present

## 2014-02-14 DIAGNOSIS — I10 Essential (primary) hypertension: Secondary | ICD-10-CM | POA: Diagnosis not present

## 2014-02-14 DIAGNOSIS — I714 Abdominal aortic aneurysm, without rupture: Secondary | ICD-10-CM | POA: Diagnosis not present

## 2014-04-30 DIAGNOSIS — H2513 Age-related nuclear cataract, bilateral: Secondary | ICD-10-CM | POA: Diagnosis not present

## 2014-04-30 DIAGNOSIS — I10 Essential (primary) hypertension: Secondary | ICD-10-CM | POA: Diagnosis not present

## 2014-04-30 DIAGNOSIS — H02401 Unspecified ptosis of right eyelid: Secondary | ICD-10-CM | POA: Diagnosis not present

## 2014-05-09 DIAGNOSIS — I10 Essential (primary) hypertension: Secondary | ICD-10-CM | POA: Diagnosis not present

## 2014-05-09 DIAGNOSIS — Z125 Encounter for screening for malignant neoplasm of prostate: Secondary | ICD-10-CM | POA: Diagnosis not present

## 2014-05-16 DIAGNOSIS — I10 Essential (primary) hypertension: Secondary | ICD-10-CM | POA: Diagnosis not present

## 2014-05-16 DIAGNOSIS — R109 Unspecified abdominal pain: Secondary | ICD-10-CM | POA: Diagnosis not present

## 2014-05-16 DIAGNOSIS — E780 Pure hypercholesterolemia: Secondary | ICD-10-CM | POA: Diagnosis not present

## 2014-05-17 ENCOUNTER — Other Ambulatory Visit: Payer: Self-pay | Admitting: Internal Medicine

## 2014-05-17 DIAGNOSIS — R109 Unspecified abdominal pain: Secondary | ICD-10-CM

## 2014-05-21 ENCOUNTER — Ambulatory Visit
Admission: RE | Admit: 2014-05-21 | Discharge: 2014-05-21 | Disposition: A | Discharge status: Home / Self Care | Payer: Medicare Other | Source: Ambulatory Visit | Attending: Internal Medicine | Admitting: Internal Medicine

## 2014-05-21 DIAGNOSIS — R109 Unspecified abdominal pain: Secondary | ICD-10-CM

## 2014-05-21 DIAGNOSIS — K8020 Calculus of gallbladder without cholecystitis without obstruction: Secondary | ICD-10-CM | POA: Diagnosis not present

## 2014-05-21 DIAGNOSIS — I714 Abdominal aortic aneurysm, without rupture: Secondary | ICD-10-CM | POA: Diagnosis not present

## 2014-05-21 DIAGNOSIS — Z9049 Acquired absence of other specified parts of digestive tract: Secondary | ICD-10-CM | POA: Diagnosis not present

## 2014-05-21 DIAGNOSIS — R110 Nausea: Secondary | ICD-10-CM | POA: Diagnosis not present

## 2014-05-21 MED ORDER — IOHEXOL 300 MG/ML  SOLN
100.0000 mL | Freq: Once | INTRAMUSCULAR | Status: AC | PRN
  Administered 2014-05-21: 100 mL via INTRAVENOUS

## 2014-06-19 ENCOUNTER — Ambulatory Visit (INDEPENDENT_AMBULATORY_CARE_PROVIDER_SITE_OTHER): Payer: Medicare Other | Admitting: Gastroenterology

## 2014-06-19 ENCOUNTER — Encounter: Payer: Self-pay | Admitting: Gastroenterology

## 2014-06-19 VITALS — BP 110/56 | HR 76 | Ht 66.5 in | Wt 183.2 lb

## 2014-06-19 DIAGNOSIS — R1013 Epigastric pain: Secondary | ICD-10-CM | POA: Diagnosis not present

## 2014-06-19 NOTE — Progress Notes (Signed)
Review of pertinent gastrointestinal problems: 1. adenomatous colon polyps: Initial colonoscopy in May 2011 by Dr. Dorrene German where he found 2 polyps that appeared unresectable to him, one was in the cecum and one was at the hepatic flexure. Repeat colonoscopy by Dr. Ardis Hughs July 2011 found the same and recommended that she undergo the right hemicolectomy that was recommended to her previously. 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 Several nodes were removed none with cancer or neoplasia. He had post operative problems treated at [facility], sounds like SBO.  Repeat colonoscopy Dr. Ardis Hughs 10/2012 normal IC anastomosis, no polyps.  Recommended recall colonoscopy 10/2017. 2. Chronic dyspepsia EGD 07/2008: Dr. Dorrene German in [place]; done for eipigastric discomfort, fullness after eating; "multiple duodenal ulcers and erosions" and "patchy erythema" and "mild esophagitis". Biopsies showed no h. Pylori. He was advsided to avoid nsaids and start ppi.  Repeat EGD 03/2011 Dr. Ardis Hughs for persistent dyspepsia; moderate gastritis, otherwise normal. 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 3. Diarrhea, bloating, enteritis on CT; norovirus 03/2013  confirmed on GI pathogen panel   HPI: This is a  pleasant 74 year old man whom I last saw about 2 years ago the time of colonoscopy. He is here with his wife today.  Chief complaint is epigastric pain.  Pains tend to come on in the middle the night and drinking water usually makes them better. He is convinced, as he has been for many years, but he has a cancer growing. I tried to reassure him that that is not the case. He and his wife were vary explicit in that they wanted another upper endoscopy.  Has pain in epigastrium, drinking water in the morning. This occurs every morning.  Feels like something is in his  stomach.  Language barriet  He took nexium for 2 months and prilosec at times.  Also complain  This has been going on for many many years.  No change in his weight.    Hot water causes pains in his esophagus.   05/2014 CT scan: done for "right sided abd pain for 4 years"    "1. No acute findings in the abdomen or pelvis to account for the patient's symptoms.2. There is a single 3 mm calcified gallstone in the neck of the gallbladder, however, there are no findings to suggest acute cholecystitis at this time.3. Interval enlargement of what is now a 4.3 x 3.2 cm fusiform infrarenal abdominal aortic aneurysm, which previously measured only3.6 cm in diameter on study 03/29/2013. Recommend followup by ultrasound in 1 year. This recommendation follows ACR consensus guidelines"   Past Medical History  Diagnosis Date  . Hypertension   . Hyperlipidemia   . Gastritis   . Colitis, ischemic   . Vertigo, intermittent   . Abnormal glucose 07/22/09    A1c 6.1  . Paroxysmal atrial fibrillation   . AAA (abdominal aortic aneurysm)     3.4 cm  . GERD (gastroesophageal reflux disease)   . Hx of colonic polyps     Past Surgical History  Procedure Laterality Date  . Appendectomy  1971  . Hemorroidectomy  2002  . Hemicolectomy      Right    Current Outpatient Prescriptions  Medication Sig Dispense Refill  . atorvastatin (LIPITOR) 20 MG tablet Take 20 mg by mouth daily.     Marland Kitchen esomeprazole (Zoar) 20  MG capsule Take 20 mg by mouth daily at 12 noon.    . metoprolol succinate (TOPROL-XL) 50 MG 24 hr tablet Take 50 mg by mouth daily.     . Multiple Vitamin (MULTIVITAMIN) tablet Take 1 tablet by mouth daily.    Marland Kitchen omeprazole (PRILOSEC) 40 MG capsule Take 1 capsule (40 mg total) by mouth daily. 90 capsule 3  . Pancrelipase, Lip-Prot-Amyl, 24000 UNITS CPEP Take 24,000 Units by mouth 3 (three) times daily as needed (only when patient get stomach cramps when eating or Trouble digesting).     . valsartan  (DIOVAN) 40 MG tablet Take 1 tablet (40 mg total) by mouth daily. 90 tablet 1   No current facility-administered medications for this visit.    Allergies as of 06/19/2014  . (No Known Allergies)    Family History  Problem Relation Age of Onset  . Colon cancer Neg Hx     History   Social History  . Marital Status: Married    Spouse Name: N/A  . Number of Children: 4  . Years of Education: N/A   Occupational History  . RETIRED    Social History Main Topics  . Smoking status: Former Smoker    Quit date: 10/13/1995  . Smokeless tobacco: Never Used  . Alcohol Use: No  . Drug Use: No  . Sexual Activity: Not on file   Other Topics Concern  . Not on file   Social History Narrative     Physical Exam: Ht 5' 6.5" (1.689 m)  Wt 183 lb 4 oz (83.122 kg)  BMI 29.14 kg/m2 Constitutional: generally well-appearing Psychiatric: alert and oriented x3 Abdomen: soft, nontender, nondistended, no obvious ascites, no peritoneal signs, normal bowel sounds   Assessment and plan: 74 y.o. male with chronic intermittent epigastric pain, GERD-like also known gallstone on recent CT scan  I tried to reassure her that this is very unlikely anything significant such as a tumor cancer. He has had 2 upper endoscopies in the past 6 years. He has had at least 2 CAT scans the most recent being 2 months ago. He does have a small gallstone in the neck of his gallbladder but perhaps is causing symptoms. I think currently his symptoms are more related to GERD. I recommended he start taking H2 blocker at bedtime every night. He and his wife are very clear that they would like him to have repeat upper endoscopy. He is very concerned about the possibility that he may have underlying significant problems. Despite reassurance they really feel he needs a repeat upper endoscopy and so we will go ahead with that.   Owens Loffler, MD [facility] 06/19/2014, 1:32 PM

## 2014-06-19 NOTE — Patient Instructions (Signed)
You will be set up for an upper endoscopy for epigastric pains. Start taking ranitidine 150mg  pill, take one pill at bedtime every night.

## 2014-06-20 ENCOUNTER — Encounter: Payer: Self-pay | Admitting: Gastroenterology

## 2014-06-20 ENCOUNTER — Ambulatory Visit (AMBULATORY_SURGERY_CENTER): Payer: Medicare Other | Admitting: Gastroenterology

## 2014-06-20 VITALS — BP 112/73 | HR 62 | Temp 98.9°F | Resp 19 | Ht 66.0 in | Wt 183.0 lb

## 2014-06-20 DIAGNOSIS — E669 Obesity, unspecified: Secondary | ICD-10-CM | POA: Diagnosis not present

## 2014-06-20 DIAGNOSIS — K2970 Gastritis, unspecified, without bleeding: Secondary | ICD-10-CM | POA: Diagnosis not present

## 2014-06-20 DIAGNOSIS — K2990 Gastroduodenitis, unspecified, without bleeding: Secondary | ICD-10-CM | POA: Diagnosis not present

## 2014-06-20 DIAGNOSIS — I4891 Unspecified atrial fibrillation: Secondary | ICD-10-CM | POA: Diagnosis not present

## 2014-06-20 DIAGNOSIS — I10 Essential (primary) hypertension: Secondary | ICD-10-CM | POA: Diagnosis not present

## 2014-06-20 DIAGNOSIS — R42 Dizziness and giddiness: Secondary | ICD-10-CM | POA: Diagnosis not present

## 2014-06-20 DIAGNOSIS — R1013 Epigastric pain: Secondary | ICD-10-CM | POA: Diagnosis not present

## 2014-06-20 DIAGNOSIS — K2950 Unspecified chronic gastritis without bleeding: Secondary | ICD-10-CM | POA: Diagnosis not present

## 2014-06-20 DIAGNOSIS — K210 Gastro-esophageal reflux disease with esophagitis: Secondary | ICD-10-CM | POA: Diagnosis not present

## 2014-06-20 DIAGNOSIS — K30 Functional dyspepsia: Secondary | ICD-10-CM | POA: Diagnosis not present

## 2014-06-20 MED ORDER — SODIUM CHLORIDE 0.9 % IV SOLN: 500.0000 mL | INTRAVENOUS | Status: DC

## 2014-06-20 NOTE — Progress Notes (Signed)
Called to room to assist during endoscopic procedure.  Patient ID and intended procedure confirmed with present staff. Received instructions for my participation in the procedure from the performing physician.  

## 2014-06-20 NOTE — Op Note (Signed)
[place]  [address] [place], [address]   ENDOSCOPY PROCEDURE REPORT  PATIENT: Brandon Bradford, Brandon Bradford  MR#: [id number] BIRTHDATE: December 02, 1940 , 73  yrs. old GENDER: male ENDOSCOPIST: Milus Banister, MD PROCEDURE DATE:  06/20/2014 PROCEDURE:  EGD w/ biopsy ASA CLASS:     Class II INDICATIONS:  chronic dyspepsia. MEDICATIONS: Monitored anesthesia care TOPICAL ANESTHETIC: Cetacaine Spray  DESCRIPTION OF PROCEDURE: After the risks benefits and alternatives of the procedure were thoroughly explained, informed consent was obtained.  The LB [id number] endoscope was introduced through the mouth and advanced to the second portion of the duodenum , Without limitations.  The instrument was slowly withdrawn as the mucosa was fully examined.  There was mild to moderate, non-specific distal gastritis.  This was biopsied and sent to pathology.  The Z line was slightly irregular with patchy nodularity (vs.  edema).  This was biopsied and sent to pathology.  Retroflexed views revealed no abnormalities.     The scope was then withdrawn from the patient and the procedure completed. COMPLICATIONS: There were no immediate complications.  ENDOSCOPIC IMPRESSION: There was mild to moderate, non-specific distal gastritis.  This was biopsied and sent to pathology.  The Z line was slightly irregular with patchy nodularity (vs.  edema).  This was biopsied and sent to pathology  RECOMMENDATIONS: Await final pathology.  As discussed yesterday please start OTC ranitidine 150mg  pill, one pill at bedtime nightly.   eSigned:  Milus Banister, MD 06/20/2014 9:39 AM    CC: Jani Gravel, MD

## 2014-06-20 NOTE — Progress Notes (Signed)
Report to PACU, RN, vss, BBS= Clear.  

## 2014-06-20 NOTE — Patient Instructions (Signed)
YOU HAD AN ENDOSCOPIC PROCEDURE TODAY AT [facility]:   Refer to the procedure report that was given to you for any specific questions about what was found during the examination.  If the procedure report does not answer your questions, please call your gastroenterologist to clarify.  If you requested that your care partner not be given the details of your procedure findings, then the procedure report has been included in a sealed envelope for you to review at your convenience later.  YOU SHOULD EXPECT: Some feelings of bloating in the abdomen. Passage of more gas than usual.  Walking can help get rid of the air that was put into your GI tract during the procedure and reduce the bloating. If you had a lower endoscopy (such as a colonoscopy or flexible sigmoidoscopy) you may notice spotting of blood in your stool or on the toilet paper. If you underwent a bowel prep for your procedure, you may not have a normal bowel movement for a few days.  Please Note:  You might notice some irritation and congestion in your nose or some drainage.  This is from the oxygen used during your procedure.  There is no need for concern and it should clear up in a day or so.  SYMPTOMS TO REPORT IMMEDIATELY:   Following upper endoscopy (EGD)  Vomiting of blood or coffee ground material  New chest pain or pain under the shoulder blades  Painful or persistently difficult swallowing  New shortness of breath  Fever of 100F or higher  Black, tarry-looking stools  For urgent or emergent issues, a gastroenterologist can be reached at any hour by calling [phone].   DIET: Your first meal following the procedure should be a small meal and then it is ok to progress to your normal diet. Heavy or fried foods are harder to digest and may make you feel nauseous or bloated.  Likewise, meals heavy in dairy and vegetables can increase bloating.  Drink plenty of fluids but you should avoid alcoholic beverages for  24 hours.  ACTIVITY:  You should plan to take it easy for the rest of today and you should NOT DRIVE or use heavy machinery until tomorrow (because of the sedation medicines used during the test).    FOLLOW UP: Our staff will call the number listed on your records the next business day following your procedure to check on you and address any questions or concerns that you may have regarding the information given to you following your procedure. If we do not reach you, we will leave a message.  However, if you are feeling well and you are not experiencing any problems, there is no need to return our call.  We will assume that you have returned to your regular daily activities without incident.  If any biopsies were taken you will be contacted by phone or by letter within the next 1-3 weeks.  Please call us at [phone] if you have not heard about the biopsies in 3 weeks.    SIGNATURES/CONFIDENTIALITY: You and/or your care partner have signed paperwork which will be entered into your electronic medical record.  These signatures attest to the fact that that the information above on your After Visit Summary has been reviewed and is understood.  Full responsibility of the confidentiality of this discharge information lies with you and/or your care-partner.  Biopsy results pending.  Ranitidine 150 mg at bedtime nightly. Continue current medications.

## 2014-06-21 ENCOUNTER — Telehealth: Payer: Self-pay | Admitting: *Deleted

## 2014-06-21 NOTE — Telephone Encounter (Signed)
No answer, message left for the patient. 

## 2014-06-25 ENCOUNTER — Encounter: Payer: Self-pay | Admitting: Gastroenterology

## 2014-08-07 DIAGNOSIS — E785 Hyperlipidemia, unspecified: Secondary | ICD-10-CM | POA: Diagnosis not present

## 2014-08-07 DIAGNOSIS — R7309 Other abnormal glucose: Secondary | ICD-10-CM | POA: Diagnosis not present

## 2014-08-07 DIAGNOSIS — I10 Essential (primary) hypertension: Secondary | ICD-10-CM | POA: Diagnosis not present

## 2014-08-30 DIAGNOSIS — I714 Abdominal aortic aneurysm, without rupture: Secondary | ICD-10-CM | POA: Diagnosis not present

## 2014-09-04 ENCOUNTER — Ambulatory Visit (INDEPENDENT_AMBULATORY_CARE_PROVIDER_SITE_OTHER): Payer: Medicare Other | Admitting: Podiatry

## 2014-09-04 ENCOUNTER — Encounter: Payer: Self-pay | Admitting: Podiatry

## 2014-09-04 VITALS — BP 126/69 | HR 74 | Ht 66.5 in | Wt 182.0 lb

## 2014-09-04 DIAGNOSIS — M25571 Pain in right ankle and joints of right foot: Secondary | ICD-10-CM

## 2014-09-04 DIAGNOSIS — I10 Essential (primary) hypertension: Secondary | ICD-10-CM | POA: Diagnosis not present

## 2014-09-04 DIAGNOSIS — M21969 Unspecified acquired deformity of unspecified lower leg: Secondary | ICD-10-CM | POA: Diagnosis not present

## 2014-09-04 DIAGNOSIS — R079 Chest pain, unspecified: Secondary | ICD-10-CM | POA: Diagnosis not present

## 2014-09-04 DIAGNOSIS — M792 Neuralgia and neuritis, unspecified: Secondary | ICD-10-CM | POA: Diagnosis not present

## 2014-09-04 DIAGNOSIS — M25579 Pain in unspecified ankle and joints of unspecified foot: Secondary | ICD-10-CM | POA: Insufficient documentation

## 2014-09-04 DIAGNOSIS — E784 Other hyperlipidemia: Secondary | ICD-10-CM | POA: Diagnosis not present

## 2014-09-04 DIAGNOSIS — R7309 Other abnormal glucose: Secondary | ICD-10-CM | POA: Diagnosis not present

## 2014-09-04 DIAGNOSIS — Z125 Encounter for screening for malignant neoplasm of prostate: Secondary | ICD-10-CM | POA: Diagnosis not present

## 2014-09-04 NOTE — Patient Instructions (Signed)
Seen for pain in right foot. Noted of weakened metatarsal bone. Need well supported shoes. Metatarsal binder dispensed.

## 2014-09-04 NOTE — Progress Notes (Signed)
Subjective: 74 year old male presents complaining of pain at the first MPJ and sharp shooting pain over the neck area of right first metatarsal bone duration of over 6 months. Plays golf regular base.  Also having problem with cold feet.  Review of Systems - Negative except hypertension.   Objective: Dermatologic: No abnormal findings.  Neurovascular status are within normal. All pedal pulses are palpable. Orthopedic: Excess sagittal plane motion of the first ray bilateral.  Assessment: Hypermobile first ray bilateral. Arthralgia first MPJ right. Neuralgia dorsomedial cutaneous nerve right.  Plan: Reviewed findings. All available treatment options offered; injection, orthotics, change in activity and shoe gear. Metatarsal binder dispensed x 1.

## 2014-09-10 DIAGNOSIS — E785 Hyperlipidemia, unspecified: Secondary | ICD-10-CM | POA: Diagnosis not present

## 2014-09-10 DIAGNOSIS — I714 Abdominal aortic aneurysm, without rupture: Secondary | ICD-10-CM | POA: Diagnosis not present

## 2014-09-10 DIAGNOSIS — I10 Essential (primary) hypertension: Secondary | ICD-10-CM | POA: Diagnosis not present

## 2014-09-10 DIAGNOSIS — Z6833 Body mass index (BMI) 33.0-33.9, adult: Secondary | ICD-10-CM | POA: Diagnosis not present

## 2014-11-02 DIAGNOSIS — Z23 Encounter for immunization: Secondary | ICD-10-CM | POA: Diagnosis not present

## 2015-02-07 DIAGNOSIS — S93401A Sprain of unspecified ligament of right ankle, initial encounter: Secondary | ICD-10-CM | POA: Diagnosis not present

## 2015-02-07 DIAGNOSIS — M1388 Other specified arthritis, other site: Secondary | ICD-10-CM | POA: Diagnosis not present

## 2015-02-07 DIAGNOSIS — S99911A Unspecified injury of right ankle, initial encounter: Secondary | ICD-10-CM | POA: Diagnosis not present

## 2015-03-01 DIAGNOSIS — R7309 Other abnormal glucose: Secondary | ICD-10-CM | POA: Diagnosis not present

## 2015-03-01 DIAGNOSIS — Z79899 Other long term (current) drug therapy: Secondary | ICD-10-CM | POA: Diagnosis not present

## 2015-03-01 DIAGNOSIS — I10 Essential (primary) hypertension: Secondary | ICD-10-CM | POA: Diagnosis not present

## 2015-03-01 DIAGNOSIS — Z125 Encounter for screening for malignant neoplasm of prostate: Secondary | ICD-10-CM | POA: Diagnosis not present

## 2015-03-13 DIAGNOSIS — R739 Hyperglycemia, unspecified: Secondary | ICD-10-CM | POA: Diagnosis not present

## 2015-03-13 DIAGNOSIS — I10 Essential (primary) hypertension: Secondary | ICD-10-CM | POA: Diagnosis not present

## 2015-03-13 DIAGNOSIS — E785 Hyperlipidemia, unspecified: Secondary | ICD-10-CM | POA: Diagnosis not present

## 2015-03-13 DIAGNOSIS — I714 Abdominal aortic aneurysm, without rupture: Secondary | ICD-10-CM | POA: Diagnosis not present

## 2015-03-19 DIAGNOSIS — S93401A Sprain of unspecified ligament of right ankle, initial encounter: Secondary | ICD-10-CM | POA: Diagnosis not present

## 2015-03-19 DIAGNOSIS — S99911D Unspecified injury of right ankle, subsequent encounter: Secondary | ICD-10-CM | POA: Diagnosis not present

## 2015-05-02 DIAGNOSIS — I714 Abdominal aortic aneurysm, without rupture: Secondary | ICD-10-CM | POA: Diagnosis not present

## 2015-06-04 DIAGNOSIS — J029 Acute pharyngitis, unspecified: Secondary | ICD-10-CM | POA: Diagnosis not present

## 2015-06-04 DIAGNOSIS — R05 Cough: Secondary | ICD-10-CM | POA: Diagnosis not present

## 2015-06-21 DIAGNOSIS — M79671 Pain in right foot: Secondary | ICD-10-CM | POA: Diagnosis not present

## 2015-06-21 DIAGNOSIS — M109 Gout, unspecified: Secondary | ICD-10-CM | POA: Diagnosis not present

## 2015-07-04 DIAGNOSIS — R2231 Localized swelling, mass and lump, right upper limb: Secondary | ICD-10-CM | POA: Diagnosis not present

## 2015-07-04 DIAGNOSIS — M67411 Ganglion, right shoulder: Secondary | ICD-10-CM | POA: Diagnosis not present

## 2015-07-04 DIAGNOSIS — M109 Gout, unspecified: Secondary | ICD-10-CM | POA: Diagnosis not present

## 2015-07-30 DIAGNOSIS — H2513 Age-related nuclear cataract, bilateral: Secondary | ICD-10-CM | POA: Diagnosis not present

## 2015-07-30 DIAGNOSIS — H5203 Hypermetropia, bilateral: Secondary | ICD-10-CM | POA: Diagnosis not present

## 2015-09-02 DIAGNOSIS — R739 Hyperglycemia, unspecified: Secondary | ICD-10-CM | POA: Diagnosis not present

## 2015-09-02 DIAGNOSIS — I10 Essential (primary) hypertension: Secondary | ICD-10-CM | POA: Diagnosis not present

## 2015-09-20 ENCOUNTER — Other Ambulatory Visit: Payer: Self-pay

## 2015-09-26 DIAGNOSIS — R739 Hyperglycemia, unspecified: Secondary | ICD-10-CM | POA: Diagnosis not present

## 2015-09-26 DIAGNOSIS — Z23 Encounter for immunization: Secondary | ICD-10-CM | POA: Diagnosis not present

## 2015-09-26 DIAGNOSIS — E785 Hyperlipidemia, unspecified: Secondary | ICD-10-CM | POA: Diagnosis not present

## 2015-09-26 DIAGNOSIS — I10 Essential (primary) hypertension: Secondary | ICD-10-CM | POA: Diagnosis not present

## 2015-09-26 DIAGNOSIS — M542 Cervicalgia: Secondary | ICD-10-CM | POA: Diagnosis not present

## 2015-10-08 DIAGNOSIS — Z6833 Body mass index (BMI) 33.0-33.9, adult: Secondary | ICD-10-CM | POA: Diagnosis not present

## 2015-10-08 DIAGNOSIS — I714 Abdominal aortic aneurysm, without rupture: Secondary | ICD-10-CM | POA: Diagnosis not present

## 2015-10-08 DIAGNOSIS — I10 Essential (primary) hypertension: Secondary | ICD-10-CM | POA: Diagnosis not present

## 2015-10-17 DIAGNOSIS — M109 Gout, unspecified: Secondary | ICD-10-CM | POA: Diagnosis not present

## 2015-10-18 DIAGNOSIS — M109 Gout, unspecified: Secondary | ICD-10-CM | POA: Diagnosis not present

## 2015-10-20 DIAGNOSIS — M25571 Pain in right ankle and joints of right foot: Secondary | ICD-10-CM | POA: Diagnosis not present

## 2015-10-20 DIAGNOSIS — M109 Gout, unspecified: Secondary | ICD-10-CM | POA: Diagnosis not present

## 2015-11-01 DIAGNOSIS — M109 Gout, unspecified: Secondary | ICD-10-CM | POA: Diagnosis not present

## 2015-12-09 DIAGNOSIS — M109 Gout, unspecified: Secondary | ICD-10-CM | POA: Diagnosis not present

## 2015-12-16 DIAGNOSIS — M109 Gout, unspecified: Secondary | ICD-10-CM | POA: Diagnosis not present

## 2016-02-10 DIAGNOSIS — M545 Low back pain: Secondary | ICD-10-CM | POA: Diagnosis not present

## 2016-02-12 DIAGNOSIS — I714 Abdominal aortic aneurysm, without rupture: Secondary | ICD-10-CM | POA: Diagnosis not present

## 2016-02-12 DIAGNOSIS — R350 Frequency of micturition: Secondary | ICD-10-CM | POA: Diagnosis not present

## 2016-02-12 DIAGNOSIS — I10 Essential (primary) hypertension: Secondary | ICD-10-CM | POA: Diagnosis not present

## 2016-02-12 DIAGNOSIS — Z0000 Encounter for general adult medical examination without abnormal findings: Secondary | ICD-10-CM | POA: Diagnosis not present

## 2016-02-12 DIAGNOSIS — R739 Hyperglycemia, unspecified: Secondary | ICD-10-CM | POA: Diagnosis not present

## 2016-02-12 DIAGNOSIS — N183 Chronic kidney disease, stage 3 (moderate): Secondary | ICD-10-CM | POA: Diagnosis not present

## 2016-02-14 ENCOUNTER — Ambulatory Visit (HOSPITAL_COMMUNITY)
Admission: RE | Admit: 2016-02-14 | Discharge: 2016-02-14 | Disposition: A | Discharge status: Home / Self Care | Payer: Medicare Other | Source: Ambulatory Visit | Attending: Surgery | Admitting: Surgery

## 2016-02-14 ENCOUNTER — Other Ambulatory Visit: Payer: Self-pay | Admitting: Surgery

## 2016-02-14 ENCOUNTER — Other Ambulatory Visit (HOSPITAL_COMMUNITY): Payer: Self-pay | Admitting: Surgery

## 2016-02-14 DIAGNOSIS — K8020 Calculus of gallbladder without cholecystitis without obstruction: Secondary | ICD-10-CM | POA: Diagnosis not present

## 2016-02-14 DIAGNOSIS — N281 Cyst of kidney, acquired: Secondary | ICD-10-CM | POA: Insufficient documentation

## 2016-02-14 DIAGNOSIS — K760 Fatty (change of) liver, not elsewhere classified: Secondary | ICD-10-CM | POA: Diagnosis not present

## 2016-02-14 DIAGNOSIS — R1011 Right upper quadrant pain: Secondary | ICD-10-CM | POA: Insufficient documentation

## 2016-02-14 DIAGNOSIS — R1013 Epigastric pain: Secondary | ICD-10-CM

## 2016-02-18 ENCOUNTER — Ambulatory Visit (HOSPITAL_COMMUNITY)
Admission: RE | Admit: 2016-02-18 | Discharge: 2016-02-18 | Disposition: A | Discharge status: Home / Self Care | Payer: Medicare Other | Source: Ambulatory Visit | Attending: Surgery | Admitting: Surgery

## 2016-02-18 DIAGNOSIS — R1013 Epigastric pain: Secondary | ICD-10-CM | POA: Insufficient documentation

## 2016-02-18 DIAGNOSIS — R1310 Dysphagia, unspecified: Secondary | ICD-10-CM | POA: Diagnosis not present

## 2016-02-20 ENCOUNTER — Other Ambulatory Visit: Payer: Self-pay | Admitting: Surgery

## 2016-02-20 ENCOUNTER — Ambulatory Visit: Payer: Medicare Other | Admitting: Physician Assistant

## 2016-02-20 DIAGNOSIS — R1011 Right upper quadrant pain: Secondary | ICD-10-CM

## 2016-02-21 ENCOUNTER — Encounter: Payer: Self-pay | Admitting: Gastroenterology

## 2016-02-21 ENCOUNTER — Ambulatory Visit (INDEPENDENT_AMBULATORY_CARE_PROVIDER_SITE_OTHER): Payer: Medicare Other | Admitting: Gastroenterology

## 2016-02-21 VITALS — BP 138/78 | HR 76 | Ht 66.5 in | Wt 186.1 lb

## 2016-02-21 DIAGNOSIS — R1013 Epigastric pain: Secondary | ICD-10-CM

## 2016-02-21 NOTE — Progress Notes (Signed)
Review of pertinent gastrointestinal problems: 1. adenomatous colon polyps: Initial colonoscopy in May 2011 by Dr. Dorrene German where he found 2 polyps that appeared unresectable to him, one was in the cecum and one was at the hepatic flexure. Repeat colonoscopy by Dr. Ardis Hughs July 2011 found the same and recommended that she undergo the right hemicolectomy that was recommended to her previously. 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 Several nodes were removed none with cancer or neoplasia. He had post operative problems treated at [facility], sounds like SBO.  Repeat colonoscopy Dr. Ardis Hughs 10/2012 normal IC anastomosis, no polyps.  Recommended recall colonoscopy 10/2017. 2. Chronic dyspepsia EGD 07/2008: Dr. Dorrene German in [place]; done for eipigastric discomfort, fullness after eating; "multiple duodenal ulcers and erosions" and "patchy erythema" and "mild esophagitis". Biopsies showed no h. Pylori. He was advsided to avoid nsaids and start ppi.  Repeat EGD 03/2011 Dr. Ardis Hughs for persistent dyspepsia; moderate gastritis, otherwise normal. 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 05/2014 CT scan for right sided abd pains; 53mm gallstone. Repeat EGD 06/2014 "moderate to severe distal gastritis" path no H. Pylori.   3. Diarrhea, bloating, enteritis on CT; norovirus 03/2013  confirmed on GI pathogen panel    HPI: This is a   pleasant 76 year old man whom I last saw about 2 years ago    Chief complaint is 7 years of intermittent abdominal discomforts. He and his wife feel he has a stricture or narrowing somewhere in his GI tract since the time of his surgery 7 years ago.  He says he feels that food sits in his stomach and does not empty well and that he is not digesting well.   Weight is up 3 pounds since last visit here 2 years ago.  He was seen  by his surgeon last week. From that appointment and ultrasound was ordered as well as an upper GI with small bowel follow-through test. These were both normal. I see that a HIDA scan has also been ordered.   ROS: complete GI ROS as described in HPI.  Constitutional:  No unintentional weight loss   Past Medical History:  Diagnosis Date  . AAA (abdominal aortic aneurysm)    3.4 cm  . Abnormal glucose 07/22/09   A1c 6.1  . Colitis, ischemic   . Gastritis   . GERD (gastroesophageal reflux disease)   . Hx of colonic polyps   . Hyperlipidemia   . Hypertension   . Paroxysmal atrial fibrillation   . Vertigo, intermittent     Past Surgical History:  Procedure Laterality Date  . APPENDECTOMY  1971  . HEMICOLECTOMY     Right  . HEMORROIDECTOMY  2002    Current Outpatient Prescriptions  Medication Sig Dispense Refill  . atorvastatin (LIPITOR) 20 MG tablet Take 20 mg by mouth daily.     . metoprolol succinate (TOPROL-XL) 50 MG 24 hr tablet Take 50 mg by mouth daily.     . Multiple Vitamin (MULTIVITAMIN) tablet Take 1 tablet by mouth daily.    . valsartan (DIOVAN) 40 MG tablet Take 1 tablet (40 mg total) by mouth daily. 90 tablet 1   No current facility-administered medications for this visit.     Allergies as of 02/21/2016  . (No Known Allergies)    Family History  Problem Relation Age of Onset  . Colon  cancer Neg Hx     Social History   Social History  . Marital status: Married    Spouse name: N/A  . Number of children: 4  . Years of education: N/A   Occupational History  . RETIRED Retired   Social History Main Topics  . Smoking status: Former Smoker    Quit date: 10/13/1995  . Smokeless tobacco: Never Used  . Alcohol use No  . Drug use: No  . Sexual activity: Not on file   Other Topics Concern  . Not on file   Social History Narrative  . No narrative on file     Physical Exam: Ht 5' 6.5" (1.689 m)   Wt 186 lb 2 oz (84.4 kg)   BMI 29.59 kg/m   Constitutional: generally well-appearing Psychiatric: alert and oriented x3 Abdomen: soft, nontender, nondistended, no obvious ascites, no peritoneal signs, normal bowel sounds No peripheral edema noted in lower extremities  Assessment and plan: 76 y.o. male with Functional GI complaints, dyspepsia  There is no sign of strictures or obstructions on good quality upper GI with small bowel follow-through last week. He and his wife feel that there must be some narrowing since the time of his surgery 7 years ago. Perhaps he has some mild symptomatic adhesive disease. I explained them that he is digesting his food well since he is not losing weight unexplained bleeding. He is actually up about 3 pounds since his last visit. He's had extensive testing and I recommended no further GI testing and tried to reassure him that there is nothing serious going on.  Please see the "Patient Instructions" section for addition details about the plan.  Owens Loffler, MD [facility] 02/21/2016, 10:44 AM

## 2016-02-21 NOTE — Patient Instructions (Signed)
No need for further GI testing, there is no sign of obstructions narrowing or lack of digestion on any testing you've had. Follow up as needed.

## 2016-02-28 ENCOUNTER — Ambulatory Visit (HOSPITAL_COMMUNITY): Payer: Medicare Other

## 2016-03-03 ENCOUNTER — Encounter (HOSPITAL_COMMUNITY)
Admission: RE | Admit: 2016-03-03 | Discharge: 2016-03-03 | Disposition: A | Discharge status: Home / Self Care | Payer: Medicare Other | Source: Ambulatory Visit | Attending: Surgery | Admitting: Surgery

## 2016-03-03 DIAGNOSIS — R1011 Right upper quadrant pain: Secondary | ICD-10-CM | POA: Insufficient documentation

## 2016-03-03 MED ORDER — TECHNETIUM TC 99M MEBROFENIN IV KIT
5.0000 | PACK | Freq: Once | INTRAVENOUS | Status: AC | PRN
  Administered 2016-03-03: 5 via INTRAVENOUS

## 2016-03-04 NOTE — Progress Notes (Signed)
Please call the patient and let them know that their studies are all normal.  You may offer him a follow-up appointment to discuss further.

## 2016-03-16 ENCOUNTER — Other Ambulatory Visit: Payer: Self-pay | Admitting: Nephrology

## 2016-03-16 ENCOUNTER — Ambulatory Visit
Admission: RE | Admit: 2016-03-16 | Discharge: 2016-03-16 | Disposition: A | Discharge status: Home / Self Care | Payer: Medicare Other | Source: Ambulatory Visit | Attending: Nephrology | Admitting: Nephrology

## 2016-03-16 DIAGNOSIS — N183 Chronic kidney disease, stage 3 unspecified: Secondary | ICD-10-CM

## 2016-03-16 DIAGNOSIS — I10 Essential (primary) hypertension: Secondary | ICD-10-CM | POA: Diagnosis not present

## 2016-03-16 DIAGNOSIS — I129 Hypertensive chronic kidney disease with stage 1 through stage 4 chronic kidney disease, or unspecified chronic kidney disease: Secondary | ICD-10-CM | POA: Diagnosis not present

## 2016-03-16 DIAGNOSIS — N189 Chronic kidney disease, unspecified: Secondary | ICD-10-CM | POA: Diagnosis not present

## 2016-03-16 DIAGNOSIS — E785 Hyperlipidemia, unspecified: Secondary | ICD-10-CM | POA: Diagnosis not present

## 2016-03-16 DIAGNOSIS — R739 Hyperglycemia, unspecified: Secondary | ICD-10-CM | POA: Diagnosis not present

## 2016-03-16 DIAGNOSIS — K146 Glossodynia: Secondary | ICD-10-CM | POA: Diagnosis not present

## 2016-03-24 DIAGNOSIS — L570 Actinic keratosis: Secondary | ICD-10-CM | POA: Diagnosis not present

## 2016-03-24 DIAGNOSIS — Z125 Encounter for screening for malignant neoplasm of prostate: Secondary | ICD-10-CM | POA: Diagnosis not present

## 2016-03-24 DIAGNOSIS — I10 Essential (primary) hypertension: Secondary | ICD-10-CM | POA: Diagnosis not present

## 2016-03-24 DIAGNOSIS — R739 Hyperglycemia, unspecified: Secondary | ICD-10-CM | POA: Diagnosis not present

## 2016-04-14 DIAGNOSIS — L309 Dermatitis, unspecified: Secondary | ICD-10-CM | POA: Diagnosis not present

## 2016-04-14 DIAGNOSIS — D229 Melanocytic nevi, unspecified: Secondary | ICD-10-CM | POA: Diagnosis not present

## 2016-04-28 DIAGNOSIS — I714 Abdominal aortic aneurysm, without rupture: Secondary | ICD-10-CM | POA: Diagnosis not present

## 2016-05-07 DIAGNOSIS — M545 Low back pain: Secondary | ICD-10-CM | POA: Diagnosis not present

## 2016-05-07 DIAGNOSIS — M48061 Spinal stenosis, lumbar region without neurogenic claudication: Secondary | ICD-10-CM | POA: Diagnosis not present

## 2016-05-08 DIAGNOSIS — M545 Low back pain: Secondary | ICD-10-CM | POA: Diagnosis not present

## 2016-05-11 DIAGNOSIS — I714 Abdominal aortic aneurysm, without rupture: Secondary | ICD-10-CM | POA: Diagnosis not present

## 2016-05-11 DIAGNOSIS — I10 Essential (primary) hypertension: Secondary | ICD-10-CM | POA: Diagnosis not present

## 2016-05-12 DIAGNOSIS — M545 Low back pain: Secondary | ICD-10-CM | POA: Diagnosis not present

## 2016-05-26 DIAGNOSIS — Z23 Encounter for immunization: Secondary | ICD-10-CM | POA: Diagnosis not present

## 2016-06-09 DIAGNOSIS — H02401 Unspecified ptosis of right eyelid: Secondary | ICD-10-CM | POA: Diagnosis not present

## 2016-06-09 DIAGNOSIS — H2513 Age-related nuclear cataract, bilateral: Secondary | ICD-10-CM | POA: Diagnosis not present

## 2016-06-09 DIAGNOSIS — H25043 Posterior subcapsular polar age-related cataract, bilateral: Secondary | ICD-10-CM | POA: Diagnosis not present

## 2016-06-09 DIAGNOSIS — H25013 Cortical age-related cataract, bilateral: Secondary | ICD-10-CM | POA: Diagnosis not present

## 2016-06-09 DIAGNOSIS — H2511 Age-related nuclear cataract, right eye: Secondary | ICD-10-CM | POA: Diagnosis not present

## 2016-06-16 DIAGNOSIS — M545 Low back pain: Secondary | ICD-10-CM | POA: Diagnosis not present

## 2016-06-16 DIAGNOSIS — M48061 Spinal stenosis, lumbar region without neurogenic claudication: Secondary | ICD-10-CM | POA: Diagnosis not present

## 2016-06-19 DIAGNOSIS — H269 Unspecified cataract: Secondary | ICD-10-CM

## 2016-06-19 HISTORY — DX: Unspecified cataract: H26.9

## 2016-07-10 DIAGNOSIS — R209 Unspecified disturbances of skin sensation: Secondary | ICD-10-CM | POA: Diagnosis not present

## 2016-07-10 DIAGNOSIS — I10 Essential (primary) hypertension: Secondary | ICD-10-CM | POA: Diagnosis not present

## 2016-07-10 DIAGNOSIS — I714 Abdominal aortic aneurysm, without rupture: Secondary | ICD-10-CM | POA: Diagnosis not present

## 2016-07-20 DIAGNOSIS — H2511 Age-related nuclear cataract, right eye: Secondary | ICD-10-CM | POA: Diagnosis not present

## 2016-08-10 DIAGNOSIS — H2512 Age-related nuclear cataract, left eye: Secondary | ICD-10-CM | POA: Diagnosis not present

## 2016-09-22 DIAGNOSIS — I10 Essential (primary) hypertension: Secondary | ICD-10-CM | POA: Diagnosis not present

## 2016-09-22 DIAGNOSIS — Z125 Encounter for screening for malignant neoplasm of prostate: Secondary | ICD-10-CM | POA: Diagnosis not present

## 2016-09-22 DIAGNOSIS — R739 Hyperglycemia, unspecified: Secondary | ICD-10-CM | POA: Diagnosis not present

## 2016-09-28 DIAGNOSIS — Z23 Encounter for immunization: Secondary | ICD-10-CM | POA: Diagnosis not present

## 2016-09-28 DIAGNOSIS — R1013 Epigastric pain: Secondary | ICD-10-CM | POA: Diagnosis not present

## 2016-09-28 DIAGNOSIS — E785 Hyperlipidemia, unspecified: Secondary | ICD-10-CM | POA: Diagnosis not present

## 2016-09-28 DIAGNOSIS — Z125 Encounter for screening for malignant neoplasm of prostate: Secondary | ICD-10-CM | POA: Diagnosis not present

## 2016-09-28 DIAGNOSIS — I10 Essential (primary) hypertension: Secondary | ICD-10-CM | POA: Diagnosis not present

## 2016-10-15 ENCOUNTER — Other Ambulatory Visit (HOSPITAL_COMMUNITY): Payer: Self-pay | Admitting: Gastroenterology

## 2016-10-15 DIAGNOSIS — K5900 Constipation, unspecified: Secondary | ICD-10-CM

## 2016-10-15 DIAGNOSIS — R198 Other specified symptoms and signs involving the digestive system and abdomen: Secondary | ICD-10-CM

## 2016-10-15 DIAGNOSIS — Z86010 Personal history of colonic polyps: Secondary | ICD-10-CM | POA: Diagnosis not present

## 2016-10-15 DIAGNOSIS — R140 Abdominal distension (gaseous): Secondary | ICD-10-CM | POA: Diagnosis not present

## 2016-10-15 DIAGNOSIS — R6881 Early satiety: Secondary | ICD-10-CM | POA: Diagnosis not present

## 2016-10-20 DIAGNOSIS — M65332 Trigger finger, left middle finger: Secondary | ICD-10-CM | POA: Diagnosis not present

## 2016-10-20 DIAGNOSIS — R52 Pain, unspecified: Secondary | ICD-10-CM | POA: Diagnosis not present

## 2016-10-30 ENCOUNTER — Ambulatory Visit (HOSPITAL_COMMUNITY)
Admission: RE | Admit: 2016-10-30 | Discharge: 2016-10-30 | Disposition: A | Discharge status: Home / Self Care | Payer: Medicare Other | Source: Ambulatory Visit | Attending: Gastroenterology | Admitting: Gastroenterology

## 2016-10-30 DIAGNOSIS — K5900 Constipation, unspecified: Secondary | ICD-10-CM | POA: Insufficient documentation

## 2016-10-30 DIAGNOSIS — R198 Other specified symptoms and signs involving the digestive system and abdomen: Secondary | ICD-10-CM

## 2016-10-30 DIAGNOSIS — R109 Unspecified abdominal pain: Secondary | ICD-10-CM | POA: Diagnosis not present

## 2016-10-30 DIAGNOSIS — R6881 Early satiety: Secondary | ICD-10-CM | POA: Diagnosis not present

## 2016-10-30 MED ORDER — TECHNETIUM TC 99M SULFUR COLLOID
2.2000 | Freq: Once | INTRAVENOUS | Status: AC | PRN
  Administered 2016-10-30: 2.2 via ORAL

## 2016-10-30 MED ORDER — TECHNETIUM TC 99M SULFUR COLLOID: 2.2000 | Freq: Once | INTRAVENOUS | Status: DC | PRN

## 2016-12-14 ENCOUNTER — Other Ambulatory Visit: Payer: Self-pay | Admitting: Gastroenterology

## 2016-12-14 DIAGNOSIS — R109 Unspecified abdominal pain: Secondary | ICD-10-CM

## 2016-12-14 DIAGNOSIS — R140 Abdominal distension (gaseous): Secondary | ICD-10-CM

## 2016-12-14 DIAGNOSIS — Z86010 Personal history of colonic polyps: Secondary | ICD-10-CM | POA: Diagnosis not present

## 2016-12-17 DIAGNOSIS — M67442 Ganglion, left hand: Secondary | ICD-10-CM | POA: Diagnosis not present

## 2016-12-17 DIAGNOSIS — M65332 Trigger finger, left middle finger: Secondary | ICD-10-CM | POA: Diagnosis not present

## 2016-12-21 ENCOUNTER — Other Ambulatory Visit: Payer: Medicare Other

## 2017-01-21 DIAGNOSIS — M67442 Ganglion, left hand: Secondary | ICD-10-CM | POA: Diagnosis not present

## 2017-02-15 ENCOUNTER — Encounter: Payer: Self-pay | Admitting: Family Medicine

## 2017-02-15 ENCOUNTER — Ambulatory Visit (INDEPENDENT_AMBULATORY_CARE_PROVIDER_SITE_OTHER): Payer: Medicare Other | Admitting: Family Medicine

## 2017-02-15 VITALS — BP 112/68 | HR 73 | Temp 98.4°F | Ht 67.0 in | Wt 183.2 lb

## 2017-02-15 DIAGNOSIS — R7303 Prediabetes: Secondary | ICD-10-CM

## 2017-02-15 DIAGNOSIS — E785 Hyperlipidemia, unspecified: Secondary | ICD-10-CM | POA: Diagnosis not present

## 2017-02-15 DIAGNOSIS — R1110 Vomiting, unspecified: Secondary | ICD-10-CM | POA: Diagnosis not present

## 2017-02-15 DIAGNOSIS — I10 Essential (primary) hypertension: Secondary | ICD-10-CM

## 2017-02-15 NOTE — Progress Notes (Signed)
Chief Complaint  Patient presents with  . Establish Care       New Patient Visit SUBJECTIVE: HPI: Brandon Bradford is an 77 y.o.male who is being seen for establishing care.  The patient was previously seen at Dr Lenna Sciara Kim's office.  Hypertension Patient presents for hypertension follow up. He does not routinely monitor home blood pressures. He is compliant with medications-losartan 50 mg daily and metoprolol 50 mg daily.. Patient has these side effects of medication: none He is adhering to a healthy diet overall. Exercise: Golf daily when it is warm out  Hyperlipidemia Patient presents for dyslipidemia follow up. Currently being treated with Lipitor 20 mg/d and compliance with treatment thus far has been good. He denies myalgias. He is adhering to a healthy. The patient exercises daily when it is warm out, not much lately.  The patient is not known to have coexisting coronary artery disease.  The patient has a history of a small bowel resection and subsequent regurgitation of solid foods.  He believes it may have something to do with the speed of which he eats.  This has not been an issue for quite some time.  He has had an EGD in the summer 2016 that was normal.  Most recently, he had a second opinion through [facility] gastroenterology.  An upper GI series was done that was unremarkable.  He is not losing weight.  No Known Allergies  Past Medical History:  Diagnosis Date  . AAA (abdominal aortic aneurysm) (HCC)    3.4 cm  . Abnormal glucose 07/22/09   A1c 6.1  . Colitis, ischemic ([place])   . Gastritis   . GERD (gastroesophageal reflux disease)   . Hx of colonic polyps   . Hyperlipidemia   . Hypertension   . Paroxysmal atrial fibrillation (HCC)   . Vertigo, intermittent    Past Surgical History:  Procedure Laterality Date  . APPENDECTOMY  1971  . HEMICOLECTOMY     Right  . HEMORROIDECTOMY  2002   Social History   Socioeconomic History  . Marital status: Married  Occupational  History  . Occupation: RETIRED    Employer: RETIRED  Tobacco Use  . Smoking status: Former Smoker    Last attempt to quit: 10/13/1995    Years since quitting: 21.3  . Smokeless tobacco: Never Used  Substance and Sexual Activity  . Alcohol use: No    Alcohol/week: 0.0 oz  . Drug use: No   Family History  Problem Relation Age of Onset  . Colon cancer Neg Hx      Current Outpatient Medications:  .  atorvastatin (LIPITOR) 20 MG tablet, Take 20 mg by mouth daily. , Disp: , Rfl:  .  B Complex-C (SUPER B COMPLEX PO), Take 1 tablet by mouth daily., Disp: , Rfl:  .  Cholecalciferol (VITAMIN D3) 5000 units CAPS, Take 1 capsule by mouth daily., Disp: , Rfl:  .  Magnesium 400 MG CAPS, Take 1 tablet by mouth daily., Disp: , Rfl:  .  metoprolol succinate (TOPROL-XL) 50 MG 24 hr tablet, Take 50 mg by mouth daily. , Disp: , Rfl:  .  Multiple Vitamin (MULTIVITAMIN) tablet, Take 1 tablet by mouth daily., Disp: , Rfl:  .  Probiotic Product (PROBIOTIC-10 PO), Take 1 tablet by mouth daily., Disp: , Rfl:  .  losartan (COZAAR) 50 MG tablet, Take 1 tablet (50 mg total) by mouth daily., Disp: 90 tablet, Rfl: 3  ROS Cardiovascular: Denies chest pain  Respiratory: Denies dyspnea  OBJECTIVE: BP 112/68 (BP Location: Left Arm, Patient Position: Sitting, Cuff Size: Normal)   Pulse 73   Temp 98.4 F (36.9 C) (Oral)   Ht 5\' 7"  (1.702 m)   Wt 183 lb 4 oz (83.1 kg)   SpO2 93%   BMI 28.70 kg/m   Constitutional: -  VS reviewed -  Well developed, well nourished, appears stated age -  No apparent distress  Psychiatric: -  Oriented to person, place, and time -  Memory intact -  Affect and mood normal -  Fluent conversation, good eye contact -  Judgment and insight age appropriate  Eye: -  Conjunctivae clear, no discharge -  Pupils symmetric, round, reactive to light  ENMT: -  MMM    Pharynx moist, no exudate, no erythema  Neck: -  No gross swelling, no palpable masses -  Thyroid midline, not  enlarged, mobile, no palpable masses  Cardiovascular: -  RRR -  No LE edema  Respiratory: -  Normal respiratory effort, no accessory muscle use, no retraction -  Breath sounds equal, no wheezes, no ronchi, no crackles  Gastrointestinal: -  Bowel sounds normal -  No tenderness, no distention, no guarding, no masses  Skin: -  No significant lesion on inspection -  Warm and dry to palpation   ASSESSMENT/PLAN: Regurgitation of food - Plan: CBC  Essential hypertension - Plan: Comprehensive metabolic panel  Hyperlipidemia, unspecified hyperlipidemia type - Plan: Comprehensive metabolic panel, Lipid panel  Prediabetes - Plan: Hemoglobin A1c  Patient instructed to sign release of records form from her previous PCP. Continue blood pressure and cholesterol medicine. He has follow-up with gastroenterology.  He is not having any acute issues regarding his regurgitation.  I did offer to refer him for a second opinion if he is concerned.  Follow-up with Dr. Ardis Hughs otherwise. Patient should return in 6 mo. The patient voiced understanding and agreement to the plan.   [place], DO 02/15/17  2:19 PM

## 2017-02-15 NOTE — Progress Notes (Signed)
Pre visit review using our clinic review tool, if applicable. No additional management support is needed unless otherwise documented below in the visit note. 

## 2017-02-15 NOTE — Patient Instructions (Signed)
Let us know if you need anything.  

## 2017-02-18 DIAGNOSIS — M65332 Trigger finger, left middle finger: Secondary | ICD-10-CM | POA: Diagnosis not present

## 2017-03-24 DIAGNOSIS — M65332 Trigger finger, left middle finger: Secondary | ICD-10-CM | POA: Diagnosis not present

## 2017-03-26 DIAGNOSIS — M65332 Trigger finger, left middle finger: Secondary | ICD-10-CM | POA: Diagnosis not present

## 2017-04-13 DIAGNOSIS — H02401 Unspecified ptosis of right eyelid: Secondary | ICD-10-CM | POA: Diagnosis not present

## 2017-04-13 DIAGNOSIS — H348112 Central retinal vein occlusion, right eye, stable: Secondary | ICD-10-CM | POA: Diagnosis not present

## 2017-04-13 DIAGNOSIS — H26492 Other secondary cataract, left eye: Secondary | ICD-10-CM | POA: Diagnosis not present

## 2017-04-13 DIAGNOSIS — Z961 Presence of intraocular lens: Secondary | ICD-10-CM | POA: Diagnosis not present

## 2017-04-19 DIAGNOSIS — M545 Low back pain: Secondary | ICD-10-CM | POA: Diagnosis not present

## 2017-04-22 DIAGNOSIS — M79671 Pain in right foot: Secondary | ICD-10-CM | POA: Diagnosis not present

## 2017-04-22 DIAGNOSIS — M25571 Pain in right ankle and joints of right foot: Secondary | ICD-10-CM | POA: Diagnosis not present

## 2017-04-22 DIAGNOSIS — M722 Plantar fascial fibromatosis: Secondary | ICD-10-CM | POA: Diagnosis not present

## 2017-04-22 DIAGNOSIS — M65871 Other synovitis and tenosynovitis, right ankle and foot: Secondary | ICD-10-CM | POA: Diagnosis not present

## 2017-05-01 ENCOUNTER — Encounter: Payer: Self-pay | Admitting: Family Medicine

## 2017-05-01 DIAGNOSIS — M545 Low back pain: Secondary | ICD-10-CM | POA: Diagnosis not present

## 2017-05-10 DIAGNOSIS — M65871 Other synovitis and tenosynovitis, right ankle and foot: Secondary | ICD-10-CM | POA: Diagnosis not present

## 2017-05-11 DIAGNOSIS — M47816 Spondylosis without myelopathy or radiculopathy, lumbar region: Secondary | ICD-10-CM | POA: Diagnosis not present

## 2017-06-22 ENCOUNTER — Encounter: Payer: Self-pay | Admitting: Family Medicine

## 2017-06-22 DIAGNOSIS — I714 Abdominal aortic aneurysm, without rupture: Secondary | ICD-10-CM | POA: Diagnosis not present

## 2017-07-30 DIAGNOSIS — R209 Unspecified disturbances of skin sensation: Secondary | ICD-10-CM | POA: Diagnosis not present

## 2017-07-30 DIAGNOSIS — I10 Essential (primary) hypertension: Secondary | ICD-10-CM | POA: Diagnosis not present

## 2017-07-30 DIAGNOSIS — I714 Abdominal aortic aneurysm, without rupture: Secondary | ICD-10-CM | POA: Diagnosis not present

## 2017-08-09 ENCOUNTER — Other Ambulatory Visit (INDEPENDENT_AMBULATORY_CARE_PROVIDER_SITE_OTHER): Payer: Medicare Other

## 2017-08-09 DIAGNOSIS — I10 Essential (primary) hypertension: Secondary | ICD-10-CM

## 2017-08-09 DIAGNOSIS — R7303 Prediabetes: Secondary | ICD-10-CM

## 2017-08-09 DIAGNOSIS — E785 Hyperlipidemia, unspecified: Secondary | ICD-10-CM

## 2017-08-09 DIAGNOSIS — R1110 Vomiting, unspecified: Secondary | ICD-10-CM | POA: Diagnosis not present

## 2017-08-09 LAB — HEMOGLOBIN A1C: Hgb A1c MFr Bld: 5.9 % (ref 4.6–6.5)

## 2017-08-09 LAB — CBC
HEMATOCRIT: 43.5 % (ref 39.0–52.0)
HEMOGLOBIN: 14.9 g/dL (ref 13.0–17.0)
MCHC: 34.2 g/dL (ref 30.0–36.0)
MCV: 96.6 fl (ref 78.0–100.0)
PLATELETS: 194 10*3/uL (ref 150.0–400.0)
RBC: 4.5 Mil/uL (ref 4.22–5.81)
RDW: 12.5 % (ref 11.5–15.5)
WBC: 5.5 10*3/uL (ref 4.0–10.5)

## 2017-08-09 LAB — COMPREHENSIVE METABOLIC PANEL
ALT: 18 U/L (ref 0–53)
AST: 20 U/L (ref 0–37)
Albumin: 4.3 g/dL (ref 3.5–5.2)
Alkaline Phosphatase: 57 U/L (ref 39–117)
BUN: 20 mg/dL (ref 6–23)
CALCIUM: 9.5 mg/dL (ref 8.4–10.5)
CHLORIDE: 105 meq/L (ref 96–112)
CO2: 27 meq/L (ref 19–32)
Creatinine, Ser: 1.35 mg/dL (ref 0.40–1.50)
GFR: 54.53 mL/min — AB (ref >60.00)
Glucose, Bld: 119 mg/dL — ABNORMAL HIGH (ref 70–99)
Potassium: 5.1 mEq/L (ref 3.5–5.1)
Sodium: 138 mEq/L (ref 135–145)
Total Bilirubin: 1 mg/dL (ref 0.2–1.2)
Total Protein: 7.1 g/dL (ref 6.0–8.3)

## 2017-08-09 LAB — LIPID PANEL
Cholesterol: 171 mg/dL (ref 0–200)
HDL: 56.4 mg/dL (ref >39.00)
LDL Cholesterol: 88 mg/dL (ref 0–99)
NonHDL: 114.1
Total CHOL/HDL Ratio: 3
Triglycerides: 130 mg/dL (ref 0.0–149.0)
VLDL: 26 mg/dL (ref 0.0–40.0)

## 2017-08-10 ENCOUNTER — Other Ambulatory Visit: Payer: Medicare Other

## 2017-08-16 ENCOUNTER — Encounter: Payer: Self-pay | Admitting: Family Medicine

## 2017-08-16 ENCOUNTER — Ambulatory Visit (INDEPENDENT_AMBULATORY_CARE_PROVIDER_SITE_OTHER): Payer: Medicare Other | Admitting: Family Medicine

## 2017-08-16 VITALS — BP 120/64 | HR 69 | Temp 98.1°F | Ht 67.0 in | Wt 184.0 lb

## 2017-08-16 DIAGNOSIS — Z0000 Encounter for general adult medical examination without abnormal findings: Secondary | ICD-10-CM

## 2017-08-16 DIAGNOSIS — M542 Cervicalgia: Secondary | ICD-10-CM

## 2017-08-16 MED ORDER — NAPROXEN 500 MG PO TBEC: 500.0000 mg | DELAYED_RELEASE_TABLET | Freq: Two times a day (BID) | ORAL | 0 refills | Status: DC

## 2017-08-16 MED ORDER — METOPROLOL SUCCINATE ER 50 MG PO TB24: 50.0000 mg | ORAL_TABLET | Freq: Every day | ORAL | 3 refills | Status: DC

## 2017-08-16 MED ORDER — LOSARTAN POTASSIUM 50 MG PO TABS: 50.0000 mg | ORAL_TABLET | Freq: Every day | ORAL | 3 refills | Status: DC

## 2017-08-16 MED ORDER — ATORVASTATIN CALCIUM 20 MG PO TABS: 20.0000 mg | ORAL_TABLET | Freq: Every day | ORAL | 3 refills | Status: DC

## 2017-08-16 NOTE — Progress Notes (Signed)
Subjective:   Brandon Bradford is a 77 y.o. male who presents for an Initial Medicare Annual Wellness Visit.  Review of Systems  +b.l neck pain +tinnitus 10 pt ROS neg unless otherwise      Objective:    Today's Vitals   08/16/17 1315  BP: 120/64  Pulse: 69  Temp: 98.1 F (36.7 C)  TempSrc: Oral  SpO2: 96%  Weight: 184 lb (83.5 kg)  Height: 5\' 7"  (1.702 m)   Body mass index is 28.82 kg/m.  Advanced Directives 03/29/2013  Does Patient Have a Medical Advance Directive? Patient does not have advance directive;Patient would not like information    Current Medications (verified) Outpatient Encounter Medications as of 08/16/2017  Medication Sig  . atorvastatin (LIPITOR) 20 MG tablet Take 1 tablet (20 mg total) by mouth daily.  . B Complex-C (SUPER B COMPLEX PO) Take 1 tablet by mouth daily.  . Cholecalciferol (VITAMIN D3) 5000 units CAPS Take 1 capsule by mouth daily.  Marland Kitchen losartan (COZAAR) 50 MG tablet Take 1 tablet (50 mg total) by mouth daily.  . Magnesium 400 MG CAPS Take 1 tablet by mouth daily.  . metoprolol succinate (TOPROL-XL) 50 MG 24 hr tablet Take 1 tablet (50 mg total) by mouth daily.  . Multiple Vitamin (MULTIVITAMIN) tablet Take 1 tablet by mouth daily.  . Probiotic Product (PROBIOTIC-10 PO) Take 1 tablet by mouth daily.   Allergies (verified) Patient has no known allergies.   History: Past Medical History:  Diagnosis Date  . AAA (abdominal aortic aneurysm) (HCC)    3.4 cm  . Abnormal glucose 07/22/09   A1c 6.1  . Colitis, ischemic ([place])   . Gastritis   . GERD (gastroesophageal reflux disease)   . Hx of colonic polyps   . Hyperlipidemia   . Hypertension   . Paroxysmal atrial fibrillation (HCC)   . Vertigo, intermittent    Past Surgical History:  Procedure Laterality Date  . APPENDECTOMY  1971  . HEMICOLECTOMY     Right  . HEMORROIDECTOMY  2002   Family History  Problem Relation Age of Onset  . Colon cancer Neg Hx     Tobacco  Counseling Counseling given: Not required   Immunizations and Health Maintenance Immunization History  Administered Date(s) Administered  . Influenza Split 10/29/2010, 10/05/2011  . Influenza Whole 10/15/2008, 11/06/2008, 09/20/2009  . Pneumococcal Conjugate-13 05/26/2016  . Pneumococcal Polysaccharide-23 11/22/2012  . Td 10/09/2003, 11/22/2012  . Zoster 11/08/2008   Patient Care Team: Shelda Pal, DO as PCP - General (Family Medicine)  Indicate any recent Medical Services you may have received from other than [facility] providers in the past year (date may be approximate).    BP 120/64 (BP Location: Left Arm, Patient Position: Sitting, Cuff Size: Normal)   Pulse 69   Temp 98.1 F (36.7 C) (Oral)   Ht 5\' 7"  (1.702 m)   Wt 184 lb (83.5 kg)   SpO2 96%   BMI 28.82 kg/m  Gen: awake, alert HEENT: ears, eyes, nose and throat neg Heart: RRR, no bruits, no LE edema Lungs: CTAB, no access muscle use Abd: BS+, soft, NT, ND Neuro: DTRs equal & symmetric throughout, no clonus, no cerebellar signs MSK: 5/5 strength throughout, +TTP over b/l neck muscles Psych: Age appropriate judgment and insight, nml mood and affect  Assessment:   This is a routine wellness examination for [place].  Hearing/Vision screen Normal  Dietary issues and exercise activities discussed: Doing well overall. Prediabetic. Needs to cut back on  sweets.  Goals    None     Depression Screen PHQ-2: neg  Fall Risk 0 falls in past year  Is the patient's home free of loose throw rugs in walkways, pet beds, electrical cords, etc?   yes      Grab bars in the bathroom? yes      Handrails on the stairs?   yes      Adequate lighting?   yes  Cognitive Function: A&OX3 Recall- good  Screening Tests Health Maintenance  Topic Date Due  . INFLUENZA VACCINE  08/19/2017  . COLONOSCOPY  10/26/2017  . TETANUS/TDAP  11/23/2022  . PNA vac Low Risk Adult  Completed   Cancer Screenings: Lung: Low Dose CT  Chest recommended if Age 50-80 years, 30 pack-year currently smoking OR have quit w/in 15years. Patient does not qualify. Colorectal: screening complete  Additional Screenings:  Hepatitis C Screening: N/A      Plan:  Medicare annual wellness visit, initial  Neck pain   I have personally reviewed and noted the following in the patient's chart:   . Medical and social history . Use of alcohol, tobacco or illicit drugs  . Current medications and supplements . Functional ability and status . Nutritional status . Physical activity . Advanced directives . List of other physicians . Hospitalizations, surgeries, and ER visits in previous 12 months . Vitals . Screenings to include cognitive, depression, and falls . Referrals and appointments  In addition, I have reviewed and discussed with patient certain preventive protocols, quality metrics, and best practice recommendations. Advanced directive paperwork provided today. Tentatively the patient is a DNR.  Heat, Tylenol, sparing use of NSAIDs, stretches/exercises for neck. F/u in 6 mo. Pt voiced understanding and agreement to the plan.   [place], DO   08/16/2017

## 2017-08-16 NOTE — Patient Instructions (Addendum)
OK to take 2 Tylenol 3 times per day.  EXERCISES RANGE OF MOTION (ROM) AND STRETCHING EXERCISES  These exercises may help you when beginning to rehabilitate your issue. In order to successfully resolve your symptoms, you must improve your posture. These exercises are designed to help reduce the forward-head and rounded-shoulder posture which contributes to this condition. Your symptoms may resolve with or without further involvement from your physician, physical therapist or athletic trainer. While completing these exercises, remember:   Restoring tissue flexibility helps normal motion to return to the joints. This allows healthier, less painful movement and activity.  An effective stretch should be held for at least 20 seconds, although you may need to begin with shorter hold times for comfort.  A stretch should never be painful. You should only feel a gentle lengthening or release in the stretched tissue.  Do not do any stretch or exercise that you cannot tolerate.  STRETCH- Axial Extensors  Lie on your back on the floor. You may bend your knees for comfort. Place a rolled-up hand towel or dish towel, about 2 inches in diameter, under the part of your head that makes contact with the floor.  Gently tuck your chin, as if trying to make a "double chin," until you feel a gentle stretch at the base of your head.  Hold 15-20 seconds. Repeat 2-3 times. Complete this exercise 1 time per day.   STRETCH - Axial Extension   Stand or sit on a firm surface. Assume a good posture: chest up, shoulders drawn back, abdominal muscles slightly tense, knees unlocked (if standing) and feet hip width apart.  Slowly retract your chin so your head slides back and your chin slightly lowers. Continue to look straight ahead.  You should feel a gentle stretch in the back of your head. Be certain not to feel an aggressive stretch since this can cause headaches later.  Hold for 15-20 seconds. Repeat 2-3 times.  Complete this exercise 1 time per day.  STRETCH - Cervical Side Bend   Stand or sit on a firm surface. Assume a good posture: chest up, shoulders drawn back, abdominal muscles slightly tense, knees unlocked (if standing) and feet hip width apart.  Without letting your nose or shoulders move, slowly tip your right / left ear to your shoulder until your feel a gentle stretch in the muscles on the opposite side of your neck.  Hold 15-20 seconds. Repeat 2-3 times. Complete this exercise 1-2 times per day.  STRETCH - Cervical Rotators   Stand or sit on a firm surface. Assume a good posture: chest up, shoulders drawn back, abdominal muscles slightly tense, knees unlocked (if standing) and feet hip width apart.  Keeping your eyes level with the ground, slowly turn your head until you feel a gentle stretch along the back and opposite side of your neck.  Hold 15-20 seconds. Repeat 2-3 times. Complete this exercise 1-2 times per day.  RANGE OF MOTION - Neck Circles   Stand or sit on a firm surface. Assume a good posture: chest up, shoulders drawn back, abdominal muscles slightly tense, knees unlocked (if standing) and feet hip width apart.  Gently roll your head down and around from the back of one shoulder to the back of the other. The motion should never be forced or painful.  Repeat the motion 10-20 times, or until you feel the neck muscles relax and loosen. Repeat 2-3 times. Complete the exercise 1-2 times per day. STRENGTHENING EXERCISES - Cervical Strain  and Sprain These exercises may help you when beginning to rehabilitate your injury. They may resolve your symptoms with or without further involvement from your physician, physical therapist, or athletic trainer. While completing these exercises, remember:   Muscles can gain both the endurance and the strength needed for everyday activities through controlled exercises.  Complete these exercises as instructed by your physician, physical  therapist, or athletic trainer. Progress the resistance and repetitions only as guided.  You may experience muscle soreness or fatigue, but the pain or discomfort you are trying to eliminate should never worsen during these exercises. If this pain does worsen, stop and make certain you are following the directions exactly. If the pain is still present after adjustments, discontinue the exercise until you can discuss the trouble with your clinician.  STRENGTH - Cervical Flexors, Isometric  Face a wall, standing about 6 inches away. Place a small pillow, a ball about 6-8 inches in diameter, or a folded towel between your forehead and the wall.  Slightly tuck your chin and gently push your forehead into the soft object. Push only with mild to moderate intensity, building up tension gradually. Keep your jaw and forehead relaxed.  Hold 10 to 20 seconds. Keep your breathing relaxed.  Release the tension slowly. Relax your neck muscles completely before you start the next repetition. Repeat 2-3 times. Complete this exercise 1 time per day.  STRENGTH- Cervical Lateral Flexors, Isometric   Stand about 6 inches away from a wall. Place a small pillow, a ball about 6-8 inches in diameter, or a folded towel between the side of your head and the wall.  Slightly tuck your chin and gently tilt your head into the soft object. Push only with mild to moderate intensity, building up tension gradually. Keep your jaw and forehead relaxed.  Hold 10 to 20 seconds. Keep your breathing relaxed.  Release the tension slowly. Relax your neck muscles completely before you start the next repetition. Repeat 2-3 times. Complete this exercise 1 time per day.  STRENGTH - Cervical Extensors, Isometric   Stand about 6 inches away from a wall. Place a small pillow, a ball about 6-8 inches in diameter, or a folded towel between the back of your head and the wall.  Slightly tuck your chin and gently tilt your head back into  the soft object. Push only with mild to moderate intensity, building up tension gradually. Keep your jaw and forehead relaxed.  Hold 10 to 20 seconds. Keep your breathing relaxed.  Release the tension slowly. Relax your neck muscles completely before you start the next repetition. Repeat 2-3 times. Complete this exercise 1 time per day.  POSTURE AND BODY MECHANICS CONSIDERATIONS Keeping correct posture when sitting, standing or completing your activities will reduce the stress put on different body tissues, allowing injured tissues a chance to heal and limiting painful experiences. The following are general guidelines for improved posture. Your physician or physical therapist will provide you with any instructions specific to your needs. While reading these guidelines, remember:  The exercises prescribed by your provider will help you have the flexibility and strength to maintain correct postures.  The correct posture provides the optimal environment for your joints to work. All of your joints have less wear and tear when properly supported by a spine with good posture. This means you will experience a healthier, less painful body.  Correct posture must be practiced with all of your activities, especially prolonged sitting and standing. Correct posture is as important  when doing repetitive low-stress activities (typing) as it is when doing a single heavy-load activity (lifting).  PROLONGED STANDING WHILE SLIGHTLY LEANING FORWARD When completing a task that requires you to lean forward while standing in one place for a long time, place either foot up on a stationary 2- to 4-inch high object to help maintain the best posture. When both feet are on the ground, the low back tends to lose its slight inward curve. If this curve flattens (or becomes too large), then the back and your other joints will experience too much stress, fatigue more quickly, and can cause pain.   RESTING POSITIONS Consider  which positions are most painful for you when choosing a resting position. If you have pain with flexion-based activities (sitting, bending, stooping, squatting), choose a position that allows you to rest in a less flexed posture. You would want to avoid curling into a fetal position on your side. If your pain worsens with extension-based activities (prolonged standing, working overhead), avoid resting in an extended position such as sleeping on your stomach. Most people will find more comfort when they rest with their spine in a more neutral position, neither too rounded nor too arched. Lying on a non-sagging bed on your side with a pillow between your knees, or on your back with a pillow under your knees will often provide some relief. Keep in mind, being in any one position for a prolonged period of time, no matter how correct your posture, can still lead to stiffness.  WALKING Walk with an upright posture. Your ears, shoulders, and hips should all line up. OFFICE WORK When working at a desk, create an environment that supports good, upright posture. Without extra support, muscles fatigue and lead to excessive strain on joints and other tissues.  CHAIR:  A chair should be able to slide under your desk when your back makes contact with the back of the chair. This allows you to work closely.  The chair's height should allow your eyes to be level with the upper part of your monitor and your hands to be slightly lower than your elbows.  Body position: ? Your feet should make contact with the floor. If this is not possible, use a foot rest. ? Keep your ears over your shoulders. This will reduce stress on your neck and low back.

## 2017-08-16 NOTE — Progress Notes (Signed)
Pre visit review using our clinic review tool, if applicable. No additional management support is needed unless otherwise documented below in the visit note. 

## 2017-09-02 ENCOUNTER — Ambulatory Visit (INDEPENDENT_AMBULATORY_CARE_PROVIDER_SITE_OTHER): Payer: Medicare Other

## 2017-09-02 DIAGNOSIS — Z23 Encounter for immunization: Secondary | ICD-10-CM | POA: Diagnosis not present

## 2017-09-21 DIAGNOSIS — I714 Abdominal aortic aneurysm, without rupture: Secondary | ICD-10-CM | POA: Diagnosis not present

## 2017-09-21 DIAGNOSIS — I10 Essential (primary) hypertension: Secondary | ICD-10-CM | POA: Diagnosis not present

## 2017-09-21 DIAGNOSIS — R0789 Other chest pain: Secondary | ICD-10-CM | POA: Diagnosis not present

## 2017-09-21 DIAGNOSIS — Z0189 Encounter for other specified special examinations: Secondary | ICD-10-CM | POA: Diagnosis not present

## 2017-10-15 DIAGNOSIS — M542 Cervicalgia: Secondary | ICD-10-CM | POA: Diagnosis not present

## 2017-10-15 DIAGNOSIS — M519 Unspecified thoracic, thoracolumbar and lumbosacral intervertebral disc disorder: Secondary | ICD-10-CM | POA: Diagnosis not present

## 2017-10-22 ENCOUNTER — Ambulatory Visit: Payer: Medicare Other

## 2017-10-25 ENCOUNTER — Ambulatory Visit (INDEPENDENT_AMBULATORY_CARE_PROVIDER_SITE_OTHER): Payer: Medicare Other

## 2017-10-25 DIAGNOSIS — Z23 Encounter for immunization: Secondary | ICD-10-CM

## 2017-10-28 DIAGNOSIS — H2511 Age-related nuclear cataract, right eye: Secondary | ICD-10-CM | POA: Diagnosis not present

## 2017-10-29 DIAGNOSIS — M47816 Spondylosis without myelopathy or radiculopathy, lumbar region: Secondary | ICD-10-CM | POA: Diagnosis not present

## 2017-11-03 DIAGNOSIS — M47816 Spondylosis without myelopathy or radiculopathy, lumbar region: Secondary | ICD-10-CM | POA: Diagnosis not present

## 2017-11-04 ENCOUNTER — Telehealth: Payer: Self-pay | Admitting: Family Medicine

## 2017-11-04 MED ORDER — LOSARTAN POTASSIUM 50 MG PO TABS: 50.0000 mg | ORAL_TABLET | Freq: Every day | ORAL | 3 refills | Status: DC

## 2017-11-04 NOTE — Telephone Encounter (Signed)
Sent in to mail order. 

## 2017-11-04 NOTE — Telephone Encounter (Signed)
Pt is requesting you reorder Losartan Potassium (Cozaar) through the mail order. She states the insurance wants a new prescription. They will not give it to her. Please call her when it has been reordered. [phone]

## 2017-11-11 ENCOUNTER — Ambulatory Visit: Payer: Medicare Other

## 2017-11-15 DIAGNOSIS — M533 Sacrococcygeal disorders, not elsewhere classified: Secondary | ICD-10-CM | POA: Diagnosis not present

## 2017-11-16 ENCOUNTER — Encounter: Payer: Self-pay | Admitting: Gastroenterology

## 2017-11-16 ENCOUNTER — Telehealth: Payer: Self-pay | Admitting: Family Medicine

## 2017-12-07 DIAGNOSIS — M533 Sacrococcygeal disorders, not elsewhere classified: Secondary | ICD-10-CM | POA: Diagnosis not present

## 2017-12-24 ENCOUNTER — Encounter: Payer: Self-pay | Admitting: Gastroenterology

## 2017-12-24 ENCOUNTER — Ambulatory Visit (INDEPENDENT_AMBULATORY_CARE_PROVIDER_SITE_OTHER): Payer: Medicare Other | Admitting: Gastroenterology

## 2017-12-24 VITALS — BP 138/80 | Ht 61.81 in | Wt 181.2 lb

## 2017-12-24 DIAGNOSIS — Z86010 Personal history of colonic polyps: Secondary | ICD-10-CM

## 2017-12-24 MED ORDER — PEG 3350-KCL-NA BICARB-NACL 420 G PO SOLR: 4000.0000 mL | ORAL | 0 refills | Status: DC

## 2017-12-24 NOTE — Progress Notes (Signed)
Review of pertinent gastrointestinal problems: 1. adenomatous colon polyps: Initial colonoscopy in May 2011 by Dr. Herold Harms he found 2 polyps that appeared unresectable to him, one was in the cecum and one was at the hepatic flexure.Repeat colonoscopy by Dr. Ardis Hughs July 2018found the same and recommended that she undergo the right hemicolectomy that was recommended to her previously. 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 Several nodes were removed none with cancer or neoplasia. He had post operative problems treated at [facility], sounds like SBO. Repeat colonoscopy Dr. Ardis Hughs 10/2014normal IC anastomosis, no polyps. Recommended recall colonoscopy 10/2017. 2. Chronic dyspepsiaEGD 07/2008: Dr. Asa Saunas [facility]; done for eipigastric discomfort, fullness after eating; "multiple duodenal ulcers and erosions" and "patchy erythema" and "mild esophagitis". Biopsies showed no h. Pylori. He was advsided to avoid nsaids and start ppi. Repeat EGD 03/2011 Dr. Ardis Hughs for persistent dyspepsia; moderate gastritis, otherwise normal. 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 05/2014 CT scan for right sided abd pains; 58mm gallstone. Repeat EGD 06/2014 "moderate to severe distal gastritis" path no H. Pylori.   3. Diarrhea, bloating, enteritis on CT; norovirus 3/2015confirmed on GI pathogen panel    HPI: This is a pleasant 77 year old man who is here with his wife today.  I last saw him here in the office about a year and a half ago for chronic dyspepsia.  It looks like he has seen a different gastroenterologist since then who ordered a gastric emptying scan and it was normal.  He is here today because I asked him to come in to discuss polyp surveillance colonoscopy, see above.  He would like to go ahead with repeat colonoscopy.  He continues  to have chronic dyspeptic symptoms.  His wife was asking if he should have a repeat upper endoscopy but I advised against it since he has had these issues for almost 10 years now without clear diagnosis.  He has no alarm symptoms.  Chief complaint is history of colon polyps  ROS: complete GI ROS as described in HPI, all other review negative.  Constitutional:  No unintentional weight loss   Past Medical History:  Diagnosis Date  . AAA (abdominal aortic aneurysm) (HCC)    3.4 cm  . Abnormal glucose 07/22/09   A1c 6.1  . Colitis, ischemic ([place])   . Gastritis   . GERD (gastroesophageal reflux disease)   . Hx of colonic polyps   . Hyperlipidemia   . Hypertension   . Paroxysmal atrial fibrillation (HCC)   . Vertigo, intermittent     Past Surgical History:  Procedure Laterality Date  . APPENDECTOMY  1971  . CATARACT EXTRACTION, BILATERAL    . HEMICOLECTOMY     Right  . HEMORROIDECTOMY  2002    Current Outpatient Medications  Medication Sig Dispense Refill  . atorvastatin (LIPITOR) 20 MG tablet Take 1 tablet (20 mg total) by mouth daily. 90 tablet 3  . B Complex-C (SUPER B COMPLEX PO) Take 1 tablet by mouth daily.    . Cholecalciferol (VITAMIN D3) 5000 units CAPS Take 1 capsule by mouth daily.    Marland Kitchen esomeprazole (NEXIUM) 20 MG capsule Take 20 mg by mouth daily at 12 noon.    Marland Kitchen losartan (COZAAR) 50 MG tablet Take 1 tablet (50 mg total) by mouth daily. 90 tablet 3  . Magnesium 400 MG CAPS Take 1  tablet by mouth daily.    . metoprolol succinate (TOPROL-XL) 50 MG 24 hr tablet Take 1 tablet (50 mg total) by mouth daily. 90 tablet 3  . Multiple Vitamin (MULTIVITAMIN) tablet Take 1 tablet by mouth daily.    . naproxen (EC NAPROSYN) 500 MG EC tablet Take 1 tablet (500 mg total) by mouth 2 (two) times daily with a meal. 60 tablet 0  . Probiotic Product (PROBIOTIC-10 PO) Take 1 tablet by mouth daily.     No current facility-administered medications for this visit.     Allergies as of  12/24/2017  . (No Known Allergies)    Family History  Problem Relation Age of Onset  . Colon cancer Neg Hx     Social History   Socioeconomic History  . Marital status: Married    Spouse name: Not on file  . Number of children: 4  . Years of education: Not on file  . Highest education level: Not on file  Occupational History  . Occupation: RETIRED    Employer: RETIRED  Social Needs  . Financial resource strain: Not on file  . Food insecurity:    Worry: Not on file    Inability: Not on file  . Transportation needs:    Medical: Not on file    Non-medical: Not on file  Tobacco Use  . Smoking status: Former Smoker    Last attempt to quit: 10/13/1995    Years since quitting: 22.2  . Smokeless tobacco: Never Used  Substance and Sexual Activity  . Alcohol use: No    Alcohol/week: 0.0 standard drinks  . Drug use: No  . Sexual activity: Yes    Partners: Female  Lifestyle  . Physical activity:    Days per week: Not on file    Minutes per session: Not on file  . Stress: Not on file  Relationships  . Social connections:    Talks on phone: Not on file    Gets together: Not on file    Attends religious service: Not on file    Active member of club or organization: Not on file    Attends meetings of clubs or organizations: Not on file    Relationship status: Not on file  . Intimate partner violence:    Fear of current or ex partner: Not on file    Emotionally abused: Not on file    Physically abused: Not on file    Forced sexual activity: Not on file  Other Topics Concern  . Not on file  Social History Narrative  . Not on file     Physical Exam: BP 138/80   Ht 5' 1.81" (1.57 m)   Wt 181 lb 4 oz (82.2 kg)   BMI 33.35 kg/m  Constitutional: generally well-appearing Psychiatric: alert and oriented x3 Abdomen: soft, nontender, nondistended, no obvious ascites, no peritoneal signs, normal bowel sounds No peripheral edema noted in lower extremities  Assessment and  plan: 77 y.o. male with history of colon polyps  I recommended repeat colonoscopy at his soonest convenience given the large, advanced polyp removed eventually by surgery several years ago.  I see no reason for any further blood tests or imaging studies prior to then.  Please see the "Patient Instructions" section for addition details about the plan.  Brandon Loffler, MD [facility] 12/24/2017, 3:45 PM

## 2017-12-24 NOTE — Patient Instructions (Addendum)
You will be set up for a colonoscopy for history of precancerous colon polyp ([place]).  You have been scheduled for a colonoscopy. Please follow written instructions given to you at your visit today.  Please pick up your prep supplies at the pharmacy within the next 1-3 days. If you use inhalers (even only as needed), please bring them with you on the day of your procedure. Your physician has requested that you go to www.startemmi.com and enter the access code given to you at your visit today. This web site gives a general overview about your procedure. However, you should still follow specific instructions given to you by our office regarding your preparation for the procedure.  Thank you for entrusting me with your care and choosing [facility].  Dr Ardis Hughs

## 2017-12-27 DIAGNOSIS — M533 Sacrococcygeal disorders, not elsewhere classified: Secondary | ICD-10-CM | POA: Diagnosis not present

## 2018-01-19 HISTORY — PX: CATARACT EXTRACTION, BILATERAL: SHX1313

## 2018-01-26 ENCOUNTER — Encounter: Payer: Self-pay | Admitting: Gastroenterology

## 2018-01-26 ENCOUNTER — Ambulatory Visit (AMBULATORY_SURGERY_CENTER): Payer: Medicare Other | Admitting: Gastroenterology

## 2018-01-26 VITALS — BP 99/64 | HR 74 | Temp 98.6°F | Resp 14 | Ht 61.0 in | Wt 181.0 lb

## 2018-01-26 DIAGNOSIS — K635 Polyp of colon: Secondary | ICD-10-CM

## 2018-01-26 DIAGNOSIS — I10 Essential (primary) hypertension: Secondary | ICD-10-CM | POA: Diagnosis not present

## 2018-01-26 DIAGNOSIS — K649 Unspecified hemorrhoids: Secondary | ICD-10-CM

## 2018-01-26 DIAGNOSIS — K219 Gastro-esophageal reflux disease without esophagitis: Secondary | ICD-10-CM | POA: Diagnosis not present

## 2018-01-26 DIAGNOSIS — Z86010 Personal history of colonic polyps: Secondary | ICD-10-CM | POA: Diagnosis not present

## 2018-01-26 DIAGNOSIS — D123 Benign neoplasm of transverse colon: Secondary | ICD-10-CM

## 2018-01-26 DIAGNOSIS — I4891 Unspecified atrial fibrillation: Secondary | ICD-10-CM | POA: Diagnosis not present

## 2018-01-26 DIAGNOSIS — R569 Unspecified convulsions: Secondary | ICD-10-CM | POA: Diagnosis not present

## 2018-01-26 MED ORDER — SODIUM CHLORIDE 0.9 % IV SOLN: 500.0000 mL | Freq: Once | INTRAVENOUS | Status: DC

## 2018-01-26 NOTE — Progress Notes (Signed)
Called to room to assist during endoscopic procedure.  Patient ID and intended procedure confirmed with present staff. Received instructions for my participation in the procedure from the performing physician.  

## 2018-01-26 NOTE — Progress Notes (Signed)
PT taken to PACU. Monitors in place. VSS. Report given to RN. 

## 2018-01-26 NOTE — Patient Instructions (Signed)
You had one polyp removed today and internal and external hemorrhoids. Handouts given on polyps and hemorrhoids.   YOU HAD AN ENDOSCOPIC PROCEDURE TODAY AT [facility]:   Refer to the procedure report that was given to you for any specific questions about what was found during the examination.  If the procedure report does not answer your questions, please call your gastroenterologist to clarify.  If you requested that your care partner not be given the details of your procedure findings, then the procedure report has been included in a sealed envelope for you to review at your convenience later.  YOU SHOULD EXPECT: Some feelings of bloating in the abdomen. Passage of more gas than usual.  Walking can help get rid of the air that was put into your GI tract during the procedure and reduce the bloating. If you had a lower endoscopy (such as a colonoscopy or flexible sigmoidoscopy) you may notice spotting of blood in your stool or on the toilet paper. If you underwent a bowel prep for your procedure, you may not have a normal bowel movement for a few days.  Please Note:  You might notice some irritation and congestion in your nose or some drainage.  This is from the oxygen used during your procedure.  There is no need for concern and it should clear up in a day or so.  SYMPTOMS TO REPORT IMMEDIATELY:   Following lower endoscopy (colonoscopy or flexible sigmoidoscopy):  Excessive amounts of blood in the stool  Significant tenderness or worsening of abdominal pains  Swelling of the abdomen that is new, acute  Fever of 100F or higher   For urgent or emergent issues, a gastroenterologist can be reached at any hour by calling [phone].   DIET:  We do recommend a small meal at first, but then you may proceed to your regular diet.  Drink plenty of fluids but you should avoid alcoholic beverages for 24 hours.  ACTIVITY:  You should plan to take it easy for the rest of today and  you should NOT DRIVE or use heavy machinery until tomorrow (because of the sedation medicines used during the test).    FOLLOW UP: Our staff will call the number listed on your records the next business day following your procedure to check on you and address any questions or concerns that you may have regarding the information given to you following your procedure. If we do not reach you, we will leave a message.  However, if you are feeling well and you are not experiencing any problems, there is no need to return our call.  We will assume that you have returned to your regular daily activities without incident.  If any biopsies were taken you will be contacted by phone or by letter within the next 1-3 weeks.  Please call us at [phone] if you have not heard about the biopsies in 3 weeks.    SIGNATURES/CONFIDENTIALITY: You and/or your care partner have signed paperwork which will be entered into your electronic medical record.  These signatures attest to the fact that that the information above on your After Visit Summary has been reviewed and is understood.  Full responsibility of the confidentiality of this discharge information lies with you and/or your care-partner.

## 2018-01-26 NOTE — Op Note (Signed)
[facility] Patient Name: Brandon Bradford Procedure Date: 01/26/2018 8:43 AM MRN: [id number] Endoscopist: Milus Banister , MD Age: 78 Referring MD:  Date of Birth: Apr 08, 1940 Gender: Male Account #: [id number] Procedure:                Colonoscopy Indications:              High risk colon cancer surveillance: Personal                            history of colonic polyps; adenomatous colon                            polyps: Initial colonoscopy in May 2011 by Dr.                            Herold Harms he found 2 polyps that appeared                            unresectable to him, one was in the cecum and one                            was at the hepatic flexure.Repeat colonoscopy by                            Dr. Ardis Hughs July 2032found the same and recommended                            that she undergo the right hemicolectomy that was                            recommended to her previously. 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 Several nodes were removed                            none with cancer or neoplasia. He had post                            operative problems treated at [facility], sounds                            like SBO. Repeat colonoscopy Dr. Ardis Hughs  10/2014normal IC anastomosis, no polyps.                            Recommended recall colonoscopy 10/2017 Medicines:                Monitored Anesthesia Care Procedure:                Pre-Anesthesia Assessment:                           - Prior to the procedure, a History and Physical                            was performed, and patient medications and                            allergies were reviewed. The  patient's tolerance of                            previous anesthesia was also reviewed. The risks                            and benefits of the procedure and the sedation                            options and risks were discussed with the patient.                            All questions were answered, and informed consent                            was obtained. Prior Anticoagulants: The patient has                            taken no previous anticoagulant or antiplatelet                            agents. ASA Grade Assessment: II - A patient with                            mild systemic disease. After reviewing the risks                            and benefits, the patient was deemed in                            satisfactory condition to undergo the procedure.                           After obtaining informed consent, the colonoscope                            was passed under direct vision. Throughout the  procedure, the patient's blood pressure, pulse, and                            oxygen saturations were monitored continuously. The                            Colonoscope was introduced through the anus and                            advanced to the the ileocolonic anastomosis. The                            colonoscopy was performed without difficulty. The                            patient tolerated the procedure well. The quality                            of the bowel preparation was good. The ileocecal                            valve, appendiceal orifice, and rectum were                            photographed. Scope In: 8:46:21 AM Scope Out: 8:55:39 AM Scope Withdrawal Time: 0 hours 7 minutes 16 seconds  Total Procedure Duration: 0 hours 9 minutes 18 seconds  Findings:                 Normal appearing right sided ileocolonic                            anastomosis.                           A 1 mm polyp was found in the transverse colon. The                             polyp was sessile. The polyp was removed with a                            cold biopsy forceps. Resection and retrieval were                            complete.                           External and internal hemorrhoids were found. The                            hemorrhoids were small.                           The exam was otherwise without abnormality on  direct and retroflexion views. Complications:            No immediate complications. Estimated blood loss:                            None. Estimated Blood Loss:     Estimated blood loss: none. Impression:               - Normal appearing right sided ileocolonic                            anastomosis.                           - One 1 mm polyp in the transverse colon, removed                            with a cold biopsy forceps. Resected and retrieved.                           - External and internal hemorrhoids.                           - The examination was otherwise normal on direct                            and retroflexion views. Recommendation:           - Patient has a contact number available for                            emergencies. The signs and symptoms of potential                            delayed complications were discussed with the                            patient. Return to normal activities tomorrow.                            Written discharge instructions were provided to the                            patient.                           - Resume previous diet.                           - Continue present medications.                           - Await final pathology results for futher                            recommendations. Milus Banister, MD 01/26/2018 9:00:46 AM This report has been signed electronically.

## 2018-01-27 ENCOUNTER — Telehealth: Payer: Self-pay | Admitting: *Deleted

## 2018-01-27 NOTE — Telephone Encounter (Signed)
  Follow up Call-  Call back number 01/26/2018  Post procedure Call Back phone  # [phone]  Permission to leave phone message Yes  Some recent data might be hidden     Patient questions:  Do you have a fever, pain , or abdominal swelling? No. Pain Score  0 *  Have you tolerated food without any problems? Yes.    Have you been able to return to your normal activities? Yes.    Do you have any questions about your discharge instructions: Diet   No. Medications  No. Follow up visit  No.  Do you have questions or concerns about your Care? No.  Actions: * If pain score is 4 or above: No action needed, pain <4.

## 2018-02-03 ENCOUNTER — Encounter: Payer: Self-pay | Admitting: Gastroenterology

## 2018-02-04 ENCOUNTER — Emergency Department (HOSPITAL_BASED_OUTPATIENT_CLINIC_OR_DEPARTMENT_OTHER)
Admission: EM | Admit: 2018-02-04 | Discharge: 2018-02-04 | Disposition: A | Discharge status: Home / Self Care | Payer: Medicare Other | Attending: Emergency Medicine | Admitting: Emergency Medicine

## 2018-02-04 ENCOUNTER — Encounter (HOSPITAL_BASED_OUTPATIENT_CLINIC_OR_DEPARTMENT_OTHER): Payer: Self-pay

## 2018-02-04 ENCOUNTER — Emergency Department (HOSPITAL_BASED_OUTPATIENT_CLINIC_OR_DEPARTMENT_OTHER): Payer: Medicare Other

## 2018-02-04 ENCOUNTER — Other Ambulatory Visit: Payer: Self-pay

## 2018-02-04 DIAGNOSIS — R531 Weakness: Secondary | ICD-10-CM | POA: Diagnosis not present

## 2018-02-04 DIAGNOSIS — R5381 Other malaise: Secondary | ICD-10-CM | POA: Diagnosis not present

## 2018-02-04 DIAGNOSIS — R5383 Other fatigue: Secondary | ICD-10-CM | POA: Insufficient documentation

## 2018-02-04 DIAGNOSIS — Z79899 Other long term (current) drug therapy: Secondary | ICD-10-CM | POA: Insufficient documentation

## 2018-02-04 DIAGNOSIS — Z87891 Personal history of nicotine dependence: Secondary | ICD-10-CM | POA: Diagnosis not present

## 2018-02-04 DIAGNOSIS — I10 Essential (primary) hypertension: Secondary | ICD-10-CM | POA: Insufficient documentation

## 2018-02-04 DIAGNOSIS — R002 Palpitations: Secondary | ICD-10-CM

## 2018-02-04 DIAGNOSIS — R6883 Chills (without fever): Secondary | ICD-10-CM | POA: Insufficient documentation

## 2018-02-04 LAB — BASIC METABOLIC PANEL
Anion gap: 5 (ref 5–15)
BUN: 21 mg/dL (ref 8–23)
CO2: 26 mmol/L (ref 22–32)
CREATININE: 1.14 mg/dL (ref 0.61–1.24)
Calcium: 9.6 mg/dL (ref 8.9–10.3)
Chloride: 106 mmol/L (ref 98–111)
GFR calc Af Amer: >60 mL/min (ref >60)
GFR calc non Af Amer: >60 mL/min (ref >60)
Glucose, Bld: 175 mg/dL — ABNORMAL HIGH (ref 70–99)
Potassium: 4.2 mmol/L (ref 3.5–5.1)
Sodium: 137 mmol/L (ref 135–145)

## 2018-02-04 LAB — CBC
HCT: 43.4 % (ref 39.0–52.0)
Hemoglobin: 14.2 g/dL (ref 13.0–17.0)
MCH: 31.6 pg (ref 26.0–34.0)
MCHC: 32.7 g/dL (ref 30.0–36.0)
MCV: 96.4 fL (ref 80.0–100.0)
Platelets: 214 10*3/uL (ref 150–400)
RBC: 4.5 MIL/uL (ref 4.22–5.81)
RDW: 11.8 % (ref 11.5–15.5)
WBC: 6.8 10*3/uL (ref 4.0–10.5)
nRBC: 0 % (ref 0.0–0.2)

## 2018-02-04 LAB — URINALYSIS, ROUTINE W REFLEX MICROSCOPIC
BILIRUBIN URINE: NEGATIVE
Glucose, UA: NEGATIVE mg/dL
Hgb urine dipstick: NEGATIVE
Ketones, ur: NEGATIVE mg/dL
Leukocytes, UA: NEGATIVE
Nitrite: NEGATIVE
Protein, ur: NEGATIVE mg/dL
Specific Gravity, Urine: <1.005 — ABNORMAL LOW (ref 1.005–1.030)
pH: 6 (ref 5.0–8.0)

## 2018-02-04 LAB — TROPONIN I: Troponin I: <0.03 ng/mL (ref <0.03)

## 2018-02-04 MED ORDER — SODIUM CHLORIDE 0.9 % IV BOLUS
1000.0000 mL | Freq: Once | INTRAVENOUS | Status: AC
  Administered 2018-02-04: 1000 mL via INTRAVENOUS

## 2018-02-04 NOTE — ED Provider Notes (Signed)
[facility] EMERGENCY DEPARTMENT Provider Note   CSN: [id number] Arrival date & time: 02/04/18  1238     History   Chief Complaint Chief Complaint  Patient presents with  . Irregular Heart Beat    HPI Brandon Bradford is a 78 y.o. male.  78 year old male here with his wife for evaluation of feeling his heart skipping beats today.  She said he felt very cold this morning although it sounds like it was cold in the room.  He was checking his pulse and noticed it was pausing.  He denies any chest pain.  No shortness of breath.  Says he feels just, generally weak.  No fever no cough no nausea or vomiting no urinary symptoms.  No recent medication changes.  The history is provided by the patient.  Palpitations  Palpitations quality:  Slow Onset quality:  Unable to specify Timing:  Intermittent Progression:  Resolved Chronicity:  New Context: not anxiety, not blood loss, not dehydration, not exercise, not illicit drugs and not stimulant use   Relieved by:  Nothing Worsened by:  Nothing Ineffective treatments:  None tried Associated symptoms: malaise/fatigue   Associated symptoms: no back pain, no chest pain, no cough, no diaphoresis, no dizziness, no leg pain, no lower extremity edema, no nausea, no near-syncope, no numbness, no shortness of breath, no syncope, no vomiting and no weakness     Past Medical History:  Diagnosis Date  . AAA (abdominal aortic aneurysm) (HCC)    3.4 cm  . Abnormal glucose 07/22/09   A1c 6.1  . Cataract 06/2016   bilateral cataract extraction  . Colitis, ischemic ([place])   . Gastritis   . GERD (gastroesophageal reflux disease)   . Hx of colonic polyps   . Hyperlipidemia   . Hypertension   . Paroxysmal atrial fibrillation (HCC)   . Vertigo, intermittent     Patient Active Problem List   Diagnosis Date Noted  . Regurgitation of food 02/15/2017  . Metatarsal deformity 09/04/2014  . Arthralgia of foot 09/04/2014  . Neuralgia 09/04/2014  .  Enteritis due to Norovirus 03/31/2013  . Enteritis 03/29/2013  . Sepsis ([place]) 03/29/2013  . AKI (acute kidney injury) ([place]) 03/29/2013  . Heart palpitations 12/29/2011  . Atypical chest pain 10/05/2011  . Erectile dysfunction 11/10/2010  . Chest pain 08/27/2010  . KNEE PAIN 02/17/2010  . ATRIAL FIBRILLATION, PAROXYSMAL 08/19/2009  . SMALL BOWEL OBSTRUCTION 08/19/2009  . PRE-DIABETES 08/19/2009  . 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 ABDOMINAL AORTIC ANEURYSM 04/04/2009  . BENIGN PROSTATIC HYPERTROPHY, WITH OBSTRUCTION 04/04/2009  . GERD 01/31/2009  . BACK PAIN, THORACIC REGION, RIGHT 01/31/2009  . HYPERLIPIDEMIA 10/15/2008  . HYPERTENSION 10/15/2008    Past Surgical History:  Procedure Laterality Date  . APPENDECTOMY  1971  . CATARACT EXTRACTION, BILATERAL    . HEMICOLECTOMY     Right  . HEMORROIDECTOMY  2002        Home Medications    Prior to Admission medications   Medication Sig Start Date End Date Taking? Authorizing Provider  B Complex-C (SUPER B COMPLEX PO) Take 1 tablet by mouth daily.   Yes [provider]  Cholecalciferol (VITAMIN D3) 5000 units CAPS Take 1 capsule by mouth daily.   Yes [provider]  esomeprazole (NEXIUM) 20 MG capsule Take 20 mg by mouth daily at 12 noon.   Yes [provider]  losartan (COZAAR) 50 MG tablet Take 1 tablet (50 mg total) by mouth daily. 11/04/17  Yes Riki Sheer  Paul, DO  metoprolol succinate (TOPROL-XL) 50 MG 24 hr tablet Take 1 tablet (50 mg total) by mouth daily. 08/16/17  Yes Shelda Pal, DO  Multiple Vitamin (MULTIVITAMIN) tablet Take 1 tablet by mouth daily.   Yes [provider]  Probiotic Product (PROBIOTIC-10 PO) Take 1 tablet by mouth daily.   Yes [provider]  atorvastatin (LIPITOR) 20 MG tablet Take 1 tablet (20 mg total) by mouth daily. 08/16/17   Shelda Pal, DO  Magnesium 400 MG CAPS Take 1 tablet by mouth daily.    [provider]  naproxen (EC NAPROSYN) 500 MG EC tablet Take 1 tablet (500 mg total) by mouth 2 (two) times daily with a meal. 08/16/17   Wendling, Crosby Oyster, DO    Family History Family History  Problem Relation Age of Onset  . Colon cancer Neg Hx     Social History Social History   Tobacco Use  . Smoking status: Former Smoker    Last attempt to quit: 10/13/1995    Years since quitting: 22.3  . Smokeless tobacco: Never Used  Substance Use Topics  . Alcohol use: No    Alcohol/week: 0.0 standard drinks  . Drug use: No     Allergies   Patient has no known allergies.   Review of Systems Review of Systems  Constitutional: Positive for chills and malaise/fatigue. Negative for diaphoresis and fever.  HENT: Negative for sore throat.   Eyes: Negative for visual disturbance.  Respiratory: Negative for cough and shortness of breath.   Cardiovascular: Positive for palpitations. Negative for chest pain, syncope and near-syncope.  Gastrointestinal: Negative for abdominal pain, nausea and vomiting.  Genitourinary: Negative for dysuria.  Musculoskeletal: Negative for back pain.  Skin: Negative for rash.  Neurological: Negative for dizziness, weakness and numbness.     Physical Exam Updated Vital Signs BP (!) 192/99 (BP Location: Left Arm)   Pulse 76   Temp 98.1 F (36.7 C) (Oral)   Resp 18   SpO2 100%   Physical Exam Vitals signs and nursing note reviewed.  Constitutional:      Appearance: He is well-developed.  HENT:     Head: Normocephalic and atraumatic.  Eyes:     Conjunctiva/sclera: Conjunctivae normal.  Neck:     Musculoskeletal: Neck supple.  Cardiovascular:     Rate and Rhythm: Normal rate and regular rhythm.     Pulses: Normal pulses.     Heart sounds: No murmur.  Pulmonary:     Effort: Pulmonary effort is normal. No respiratory distress.     Breath sounds: Normal breath sounds.  Abdominal:     Palpations: Abdomen is soft.     Tenderness: There is  no abdominal tenderness.  Musculoskeletal: Normal range of motion.        General: No tenderness or signs of injury.  Skin:    General: Skin is warm and dry.     Capillary Refill: Capillary refill takes less than 2 seconds.  Neurological:     General: No focal deficit present.     Mental Status: He is alert.     Sensory: No sensory deficit.     Motor: No weakness.     Gait: Gait normal.      ED Treatments / Results  Labs (all labs ordered are listed, but only abnormal results are displayed) Labs Reviewed  BASIC METABOLIC PANEL - Abnormal; Notable for the following components:      Result Value   Glucose, Bld  175 (*)    All other components within normal limits  URINALYSIS, ROUTINE W REFLEX MICROSCOPIC - Abnormal; Notable for the following components:   Specific Gravity, Urine <1.005 (*)    All other components within normal limits  CBC  TROPONIN I    EKG None ecg sinus, rate 82, LAD, no acute st.ts  Radiology Dg Chest Port 1 View  Result Date: 02/04/2018 CLINICAL DATA:  Chills and irregular heartbeat this morning. EXAM: PORTABLE CHEST 1 VIEW COMPARISON:  10/05/2011 FINDINGS: Cardiac silhouette is top-normal in size. No mediastinal or hilar masses. No evidence of adenopathy. Lungs are clear.  No pleural effusion or pneumothorax. Skeletal structures are grossly intact. IMPRESSION: No active disease. Electronically Signed   By: Lajean Manes M.D.   On: 02/04/2018 14:01    Procedures Procedures (including critical care time)  Medications Ordered in ED Medications  sodium chloride 0.9 % bolus 1,000 mL (has no administration in time range)     Initial Impression / Assessment and Plan / ED Course  I have reviewed the triage vital signs and the nursing notes.  Pertinent labs & imaging results that were available during my care of the patient were reviewed by me and considered in my medical decision making (see chart for details).  Clinical Course as of Feb 04 1721  Fri  Feb 04, 2018  1447 Patient has been asymptomatic here-no events on the monitor.  His labs are unremarkable.  He is comfortable being discharged and he has Dr. Einar Gip is a cardiologist who he can follow-up with.  The actually tried to call the clinic today but they were too full and could not accommodate him.   [MB]    Clinical Course User Index [MB] Hayden Rasmussen, MD      Final Clinical Impressions(s) / ED Diagnoses   Final diagnoses:  Palpitations    ED Discharge Orders    None       Hayden Rasmussen, MD 02/04/18 1724

## 2018-02-04 NOTE — ED Notes (Signed)
Patient denies pain but stated that his muscles feels like shaky.

## 2018-02-04 NOTE — ED Triage Notes (Signed)
Pt c/o irregular HR, chills started this am-denies flu like sx-NAD-steady gait

## 2018-02-04 NOTE — ED Notes (Signed)
ED Provider at bedside. 

## 2018-02-04 NOTE — Discharge Instructions (Addendum)
You were seen in the emergency department for feeling chills this morning and noticing some palpitations her heart skipped beats.  We did blood work and EKG and a chest x-ray here that did not show an obvious explanation for your symptoms.  It will be important for you to schedule an appointment with your cardiologist Dr. Einar Gip.  Please return if any worsening symptoms.

## 2018-02-07 ENCOUNTER — Telehealth: Payer: Self-pay | Admitting: *Deleted

## 2018-02-07 DIAGNOSIS — I10 Essential (primary) hypertension: Secondary | ICD-10-CM

## 2018-02-07 DIAGNOSIS — R7309 Other abnormal glucose: Secondary | ICD-10-CM

## 2018-02-07 DIAGNOSIS — E785 Hyperlipidemia, unspecified: Secondary | ICD-10-CM

## 2018-02-07 NOTE — Telephone Encounter (Signed)
Pt is on lab scheduled for 02/10/18 but there are no future orders in Epic.  Please advise or place appropriate orders?

## 2018-02-08 DIAGNOSIS — I714 Abdominal aortic aneurysm, without rupture: Secondary | ICD-10-CM | POA: Diagnosis not present

## 2018-02-08 DIAGNOSIS — R739 Hyperglycemia, unspecified: Secondary | ICD-10-CM | POA: Diagnosis not present

## 2018-02-08 DIAGNOSIS — R002 Palpitations: Secondary | ICD-10-CM | POA: Diagnosis not present

## 2018-02-08 DIAGNOSIS — I10 Essential (primary) hypertension: Secondary | ICD-10-CM | POA: Diagnosis not present

## 2018-02-08 DIAGNOSIS — R0789 Other chest pain: Secondary | ICD-10-CM | POA: Diagnosis not present

## 2018-02-08 NOTE — Telephone Encounter (Signed)
Orders placed, ty for catching.

## 2018-02-10 ENCOUNTER — Other Ambulatory Visit (INDEPENDENT_AMBULATORY_CARE_PROVIDER_SITE_OTHER): Payer: Medicare Other

## 2018-02-10 ENCOUNTER — Encounter: Payer: Self-pay | Admitting: Family Medicine

## 2018-02-10 DIAGNOSIS — E785 Hyperlipidemia, unspecified: Secondary | ICD-10-CM

## 2018-02-10 DIAGNOSIS — R7309 Other abnormal glucose: Secondary | ICD-10-CM | POA: Diagnosis not present

## 2018-02-10 DIAGNOSIS — I10 Essential (primary) hypertension: Secondary | ICD-10-CM

## 2018-02-10 LAB — COMPREHENSIVE METABOLIC PANEL
ALT: 27 U/L (ref 0–53)
AST: 29 U/L (ref 0–37)
Albumin: 4.7 g/dL (ref 3.5–5.2)
Alkaline Phosphatase: 53 U/L (ref 39–117)
BUN: 25 mg/dL — ABNORMAL HIGH (ref 6–23)
CO2: 29 mEq/L (ref 19–32)
Calcium: 10.4 mg/dL (ref 8.4–10.5)
Chloride: 103 mEq/L (ref 96–112)
Creatinine, Ser: 1.42 mg/dL (ref 0.40–1.50)
GFR: 48.33 mL/min — ABNORMAL LOW (ref >60.00)
Glucose, Bld: 118 mg/dL — ABNORMAL HIGH (ref 70–99)
POTASSIUM: 5 meq/L (ref 3.5–5.1)
Sodium: 141 mEq/L (ref 135–145)
TOTAL PROTEIN: 7.5 g/dL (ref 6.0–8.3)
Total Bilirubin: 1.7 mg/dL — ABNORMAL HIGH (ref 0.2–1.2)

## 2018-02-10 LAB — LIPID PANEL
Cholesterol: 183 mg/dL (ref 0–200)
HDL: 67.4 mg/dL (ref >39.00)
LDL Cholesterol: 86 mg/dL (ref 0–99)
NonHDL: 115.69
Total CHOL/HDL Ratio: 3
Triglycerides: 146 mg/dL (ref 0.0–149.0)
VLDL: 29.2 mg/dL (ref 0.0–40.0)

## 2018-02-10 LAB — HEMOGLOBIN A1C: Hgb A1c MFr Bld: 5.9 % (ref 4.6–6.5)

## 2018-02-12 ENCOUNTER — Other Ambulatory Visit: Payer: Self-pay | Admitting: Cardiology

## 2018-02-12 DIAGNOSIS — Z136 Encounter for screening for cardiovascular disorders: Secondary | ICD-10-CM

## 2018-02-12 DIAGNOSIS — I200 Unstable angina: Secondary | ICD-10-CM

## 2018-02-14 ENCOUNTER — Ambulatory Visit (INDEPENDENT_AMBULATORY_CARE_PROVIDER_SITE_OTHER): Payer: Medicare Other | Admitting: Family Medicine

## 2018-02-14 ENCOUNTER — Encounter: Payer: Self-pay | Admitting: Family Medicine

## 2018-02-14 VITALS — BP 140/82 | HR 80 | Temp 98.0°F | Ht 67.0 in | Wt 181.4 lb

## 2018-02-14 DIAGNOSIS — I10 Essential (primary) hypertension: Secondary | ICD-10-CM

## 2018-02-14 DIAGNOSIS — E785 Hyperlipidemia, unspecified: Secondary | ICD-10-CM | POA: Diagnosis not present

## 2018-02-14 DIAGNOSIS — R7303 Prediabetes: Secondary | ICD-10-CM | POA: Insufficient documentation

## 2018-02-14 MED ORDER — LOSARTAN POTASSIUM 25 MG PO TABS: 25.0000 mg | ORAL_TABLET | Freq: Every day | ORAL | 3 refills | Status: DC

## 2018-02-14 NOTE — Progress Notes (Signed)
Chief Complaint  Patient presents with  . Hypertension    Subjective Brandon Bradford is a 78 y.o. male who presents for hypertension follow up. He does monitor home blood pressures. Blood pressures ranging from 130's/80's on average. He is compliant with medications- losartan 50 mg/d, metoprolol . Patient has these side effects of medication: none He is adhering to a healthy diet overall. Current exercise: golfing  Hyperlipidemia Patient presents for dyslipidemia follow up. Currently being treated with Lipitor 20 mg/d and compliance with treatment thus far has been good. He denies myalgias. He is adhering to a healthy. Exercise:  The patient is not known to have coexisting coronary artery disease.  Prediabtes Hx of prediabetes, no recent changes in most recent A1c. Diet healthy, relatively healthy diet.   Past Medical History:  Diagnosis Date  . AAA (abdominal aortic aneurysm) (HCC)    3.4 cm  . Abnormal glucose 07/22/09   A1c 6.1  . Cataract 06/2016   bilateral cataract extraction  . Colitis, ischemic ([place])   . Gastritis   . GERD (gastroesophageal reflux disease)   . Hx of colonic polyps   . Hyperlipidemia   . Hypertension   . Paroxysmal atrial fibrillation (HCC)   . Vertigo, intermittent     Review of Systems Cardiovascular: no chest pain Respiratory:  no shortness of breath  Exam BP 140/82 (BP Location: Left Arm, Patient Position: Sitting, Cuff Size: Normal)   Pulse 80   Temp 98 F (36.7 C) (Oral)   Ht 5\' 7"  (1.702 m)   Wt 181 lb 6 oz (82.3 kg)   SpO2 94%   BMI 28.41 kg/m  General:  well developed, well nourished, in no apparent distress Heart: RRR, no bruits, no LE edema Lungs: clear to auscultation, no accessory muscle use Psych: well oriented with normal range of affect and appropriate judgment/insight  Essential hypertension - Plan: Comprehensive metabolic panel  Hyperlipidemia, unspecified hyperlipidemia type - Plan: Lipid panel  Prediabetes - Plan:  Hemoglobin A1c  Orders as above. OK to stop checking home BP's.  Counseled on diet and exercise.  F/u in 6 mo- above labs prior to exam. The patient voiced understanding and agreement to the plan.  [place], DO 02/14/18  3:03 PM

## 2018-02-14 NOTE — Patient Instructions (Addendum)
Keep the diet clean and stay active.  Because your blood pressure is well-controlled, you no longer have to check your blood pressure at home anymore unless you wish. Some people check it twice daily every day and some people stop altogether. Either or anything in between is fine. Strong work!  Let us know if you need anything. 

## 2018-02-14 NOTE — Progress Notes (Signed)
Pre visit review using our clinic review tool, if applicable. No additional management support is needed unless otherwise documented below in the visit note. 

## 2018-02-17 ENCOUNTER — Ambulatory Visit: Payer: Medicare Other | Admitting: Family Medicine

## 2018-02-21 ENCOUNTER — Other Ambulatory Visit: Payer: Self-pay | Admitting: Cardiology

## 2018-02-21 DIAGNOSIS — R079 Chest pain, unspecified: Secondary | ICD-10-CM

## 2018-03-07 ENCOUNTER — Ambulatory Visit: Payer: Medicare Other | Admitting: Cardiology

## 2018-03-07 DIAGNOSIS — R079 Chest pain, unspecified: Secondary | ICD-10-CM

## 2018-03-07 DIAGNOSIS — R0789 Other chest pain: Secondary | ICD-10-CM | POA: Diagnosis not present

## 2018-03-07 NOTE — Progress Notes (Signed)
May be unless needed we can just file GXT as we discuss the results with patient already, otherwise duplication of work. Thoughts?

## 2018-03-17 ENCOUNTER — Ambulatory Visit: Payer: Medicare Other

## 2018-03-17 DIAGNOSIS — I714 Abdominal aortic aneurysm, without rupture: Secondary | ICD-10-CM | POA: Diagnosis not present

## 2018-03-17 DIAGNOSIS — R0789 Other chest pain: Secondary | ICD-10-CM | POA: Diagnosis not present

## 2018-03-17 DIAGNOSIS — Z136 Encounter for screening for cardiovascular disorders: Secondary | ICD-10-CM

## 2018-03-17 DIAGNOSIS — I200 Unstable angina: Secondary | ICD-10-CM

## 2018-03-20 ENCOUNTER — Telehealth: Payer: Self-pay | Admitting: Cardiology

## 2018-03-20 ENCOUNTER — Other Ambulatory Visit: Payer: Self-pay | Admitting: Cardiology

## 2018-03-20 DIAGNOSIS — I714 Abdominal aortic aneurysm, without rupture, unspecified: Secondary | ICD-10-CM

## 2018-03-20 NOTE — Telephone Encounter (Signed)
The abdominal aortic aneurysm size has remained stable.  Echocardiogram shows normal heart function, mild leak in the tricuspid valve. TR means tricuspid regurgitation (leakage on the right heart valve). Similar to MR= Mitral regurgitation. (leakage on the left heart valve). Mild MR or TR are probably normal variants unless physicians feel it is abnormal. Reassure. Please make an appointment to see me in 4-6 weeks

## 2018-03-20 NOTE — Telephone Encounter (Signed)
No chang

## 2018-04-01 ENCOUNTER — Other Ambulatory Visit: Payer: Self-pay

## 2018-04-04 DIAGNOSIS — R0781 Pleurodynia: Secondary | ICD-10-CM | POA: Diagnosis not present

## 2018-04-14 IMAGING — RF DG UGI W/ SMALL BOWEL
10 of 16 series · 13 of 24 positions shown · non-contrast
Comparison: CT scan 05/21/2014

CLINICAL DATA: Chronic indigestion, dysphagia and history of
small-bowel obstruction.

EXAM:
UPPER GI SERIES WITH SMALL BOWEL FOLLOW-THROUGH
FLUOROSCOPY TIME:  Fluoroscopy Time:  2 minutes and 42 seconds
Radiation Exposure Index (if provided by the fluoroscopic device):
97.2 mGy
Number of Acquired Spot Images: 0
TECHNIQUE: Combined double contrast and single contrast upper GI series using
effervescent crystals, thick barium, and thin barium. Subsequently,
serial images of the small bowel were obtained including spot views
of the terminal ileum.

[Series 1: t abdomen supine · 0.15mm/px · 1 of 1 slices shown]
[im 1/1]
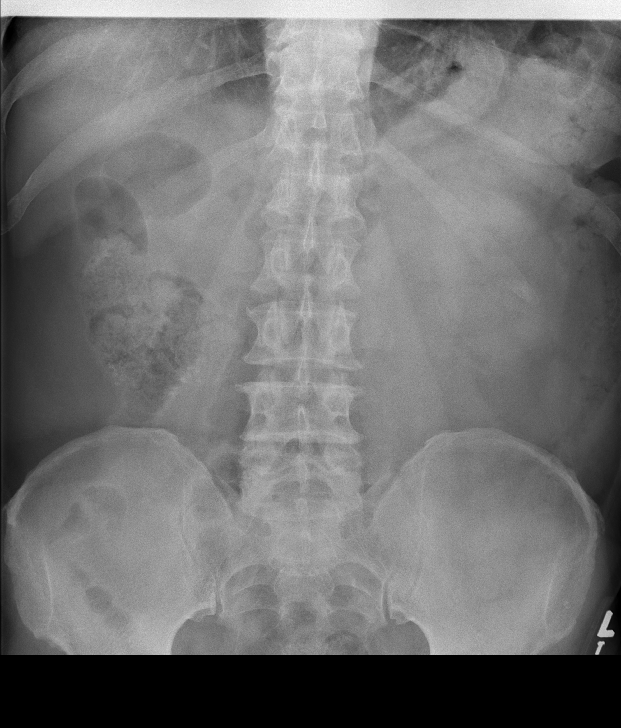

[Series 2: cp_standard · 0.51mm/px · 1 of 102 frames shown (1 of 9)]
[frame 52/102]
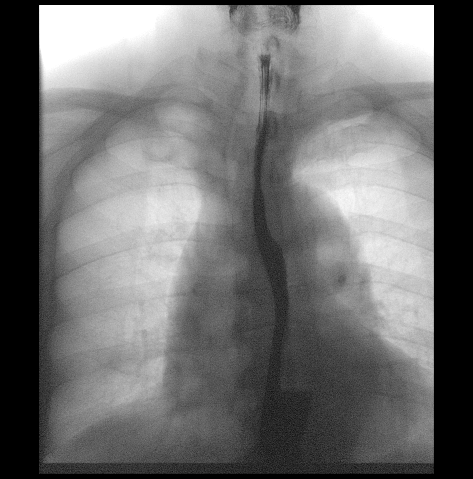

[Series 3: cp_standard · 0.51mm/px · 2 of 46 frames shown (2 of 9)]
[frame 7/46]
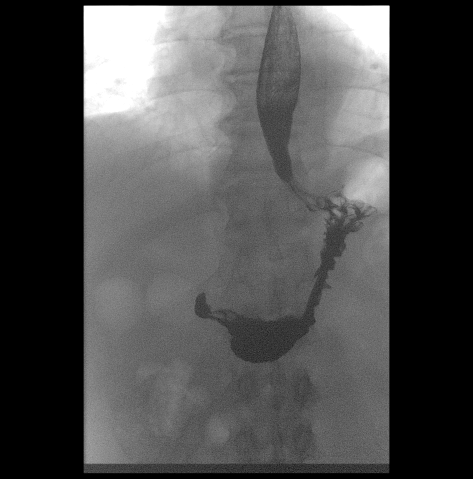
[frame 40/46]
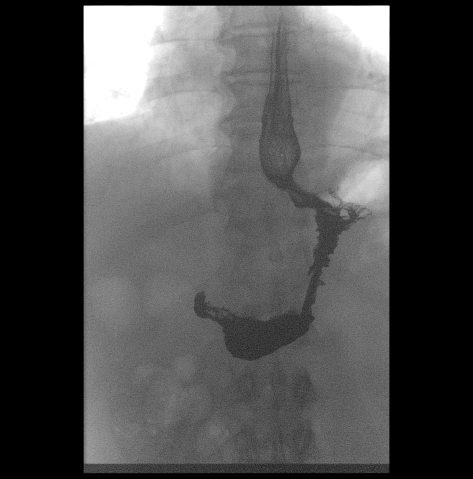

[Series 5: cp_standard · 0.18mm/px · 1 of 1 slices shown (3 of 9)]
[im 1/1]
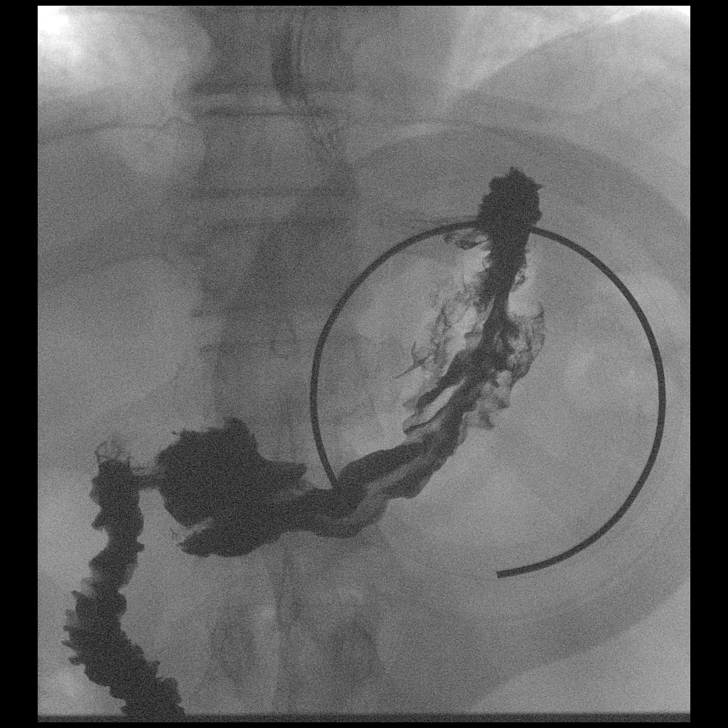

[Series 8: cp_standard · 0.34mm/px · 2 of 92 frames shown (4 of 9)]
[frame 14/92]
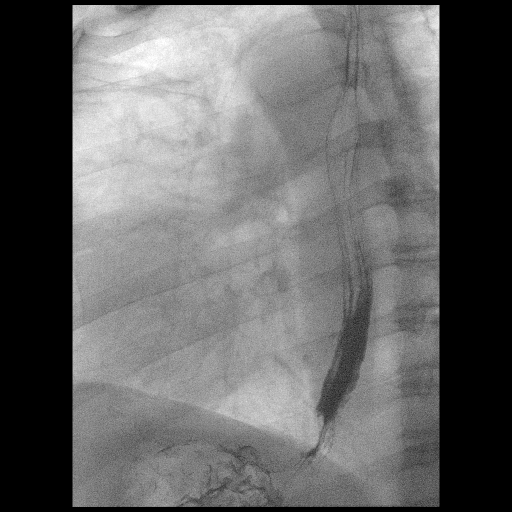
[frame 79/92]
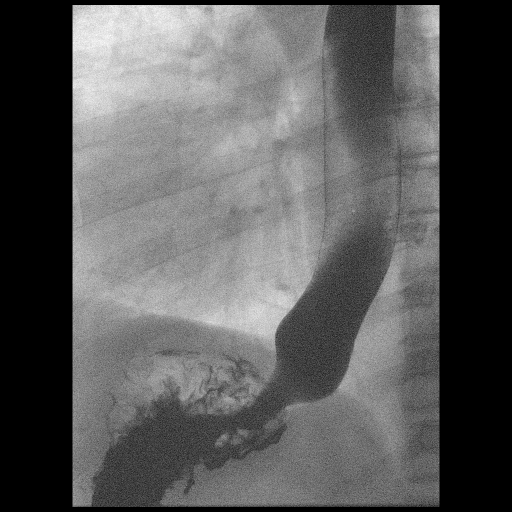

[Series 9: cp_standard · 0.34mm/px · 2 of 38 frames shown (5 of 9)]
[frame 6/38]
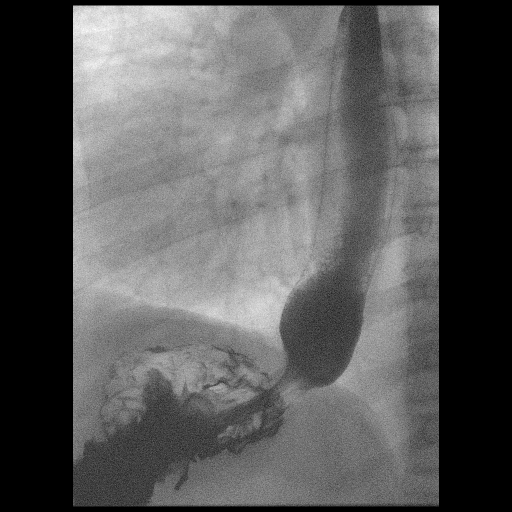
[frame 33/38]
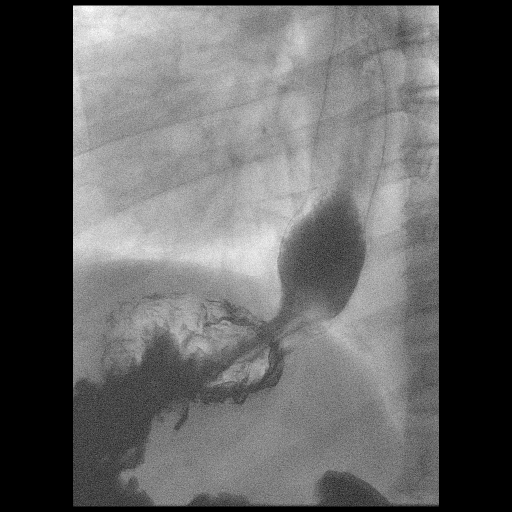

[Series 11: cp_standard · 0.25mm/px · 1 of 1 slices shown (6 of 9)]
[im 1/1]
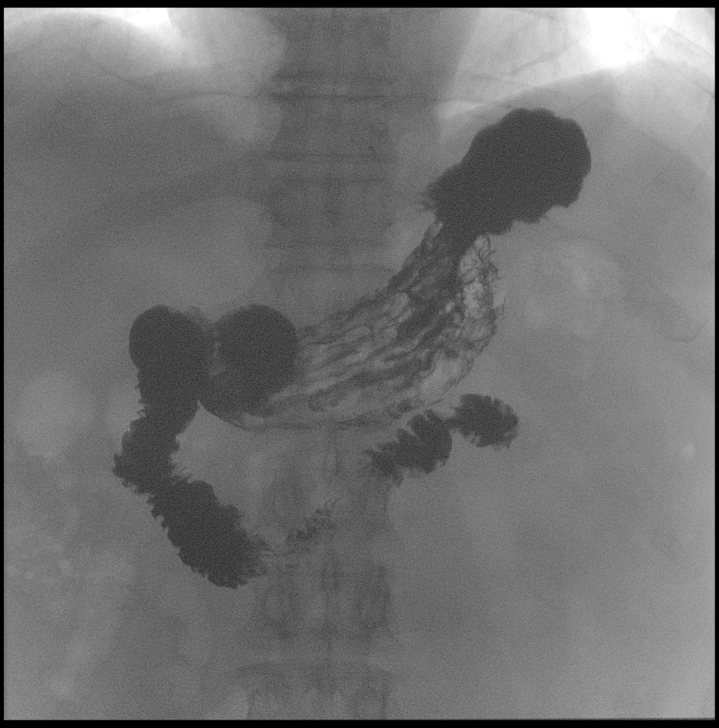

[Series 14: cp_standard · 0.17mm/px · 1 of 1 slices shown (7 of 9)]
[im 1/1]
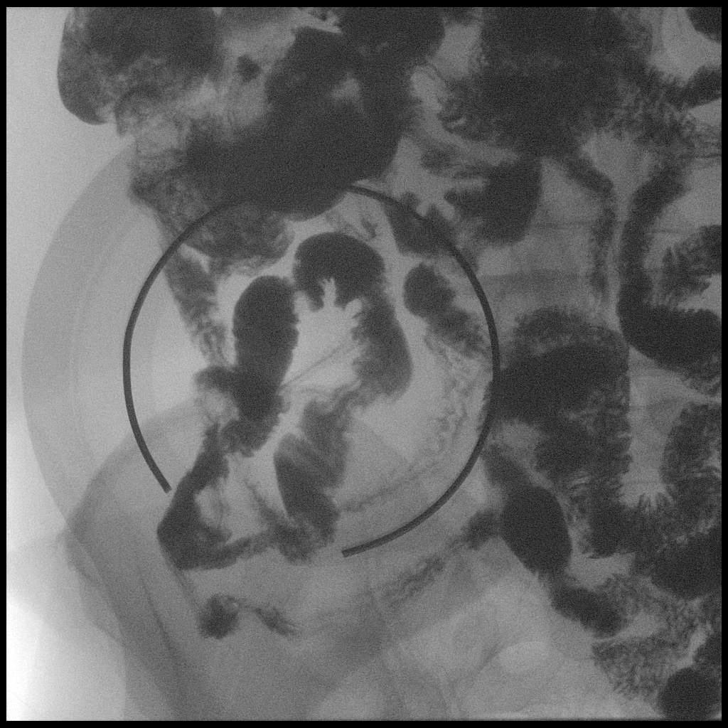

[Series 16: cp_standard · 0.17mm/px · 1 of 1 slices shown (8 of 9)]
[im 1/1]
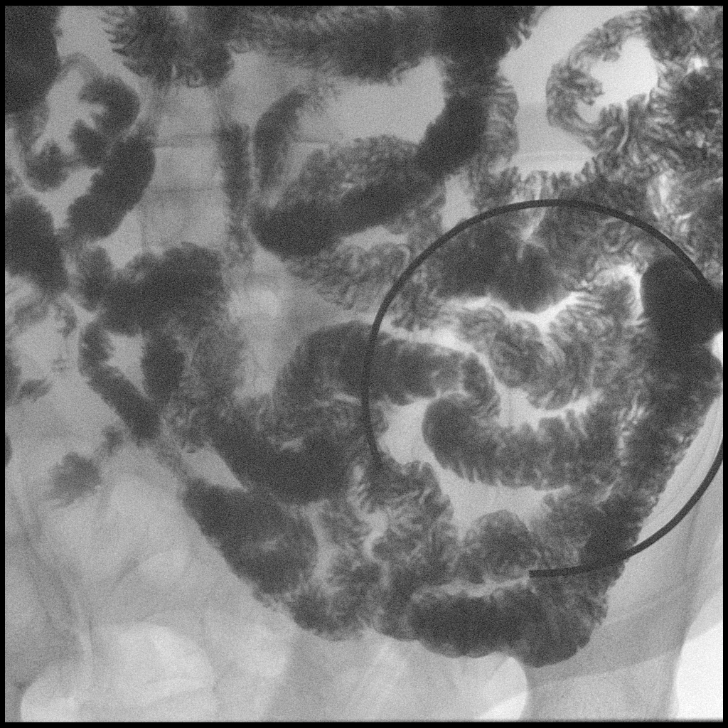

[Series 19: cp_standard · 0.17mm/px · 1 of 1 slices shown (9 of 9)]
[im 1/1]
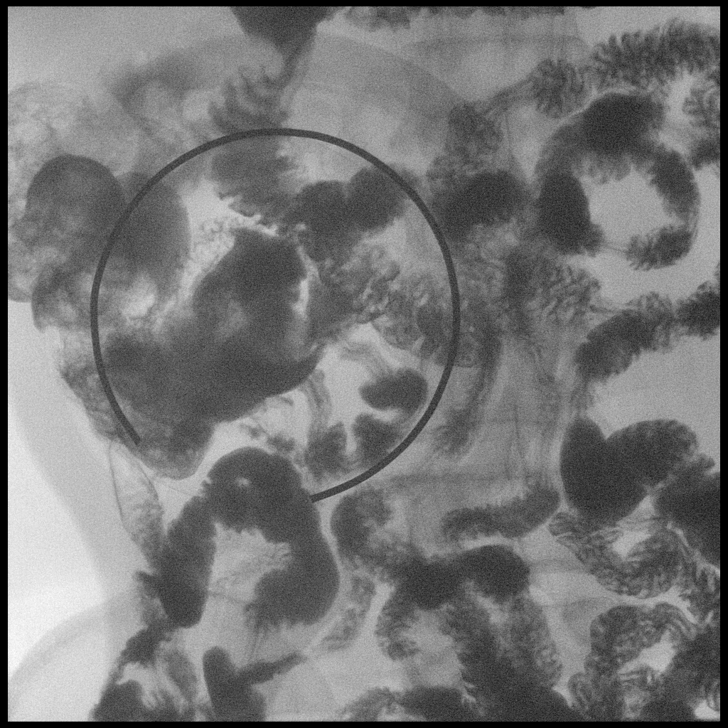

[13 of 24 positions shown; findings below may reference images not displayed]

FINDINGS: Initial barium swallows demonstrate normal esophageal motility. No
intrinsic or extrinsic lesions of the esophagus are identified. No
mucosal abnormalities.

The stomach, duodenum bulb and C-loop are unremarkable. Normal
mucosal folds. No mass or obstruction. Normal emptying of the
stomach. The duodenal jejunal junction is in its normal anatomic
location.

Small bowel follow-through demonstrates rapid small-bowel transit
time of 20 minutes. Normal appearing jejunal loops of small bowel in
the left abdomen with a normal mucosal fold pattern. Normal loops of
ileum in the mid abdomen and right lower quadrant with normal
mucosal fold pattern. No intrinsic or extrinsic lesions are
identified. The terminal ileum is normal.
IMPRESSION: Normal upper GI and small-bowel follow-through.

## 2018-07-06 ENCOUNTER — Ambulatory Visit: Payer: Medicare Other | Admitting: Family Medicine

## 2018-07-20 DIAGNOSIS — R109 Unspecified abdominal pain: Secondary | ICD-10-CM | POA: Diagnosis not present

## 2018-07-20 DIAGNOSIS — N183 Chronic kidney disease, stage 3 (moderate): Secondary | ICD-10-CM | POA: Diagnosis not present

## 2018-07-20 DIAGNOSIS — E785 Hyperlipidemia, unspecified: Secondary | ICD-10-CM | POA: Diagnosis not present

## 2018-07-20 DIAGNOSIS — R739 Hyperglycemia, unspecified: Secondary | ICD-10-CM | POA: Diagnosis not present

## 2018-07-20 DIAGNOSIS — I129 Hypertensive chronic kidney disease with stage 1 through stage 4 chronic kidney disease, or unspecified chronic kidney disease: Secondary | ICD-10-CM | POA: Diagnosis not present

## 2018-08-05 ENCOUNTER — Other Ambulatory Visit: Payer: Self-pay | Admitting: Nephrology

## 2018-08-05 DIAGNOSIS — N183 Chronic kidney disease, stage 3 unspecified: Secondary | ICD-10-CM

## 2018-08-08 ENCOUNTER — Other Ambulatory Visit (INDEPENDENT_AMBULATORY_CARE_PROVIDER_SITE_OTHER): Payer: Medicare Other

## 2018-08-08 ENCOUNTER — Other Ambulatory Visit: Payer: Self-pay

## 2018-08-08 DIAGNOSIS — E785 Hyperlipidemia, unspecified: Secondary | ICD-10-CM

## 2018-08-08 DIAGNOSIS — R7303 Prediabetes: Secondary | ICD-10-CM | POA: Diagnosis not present

## 2018-08-08 DIAGNOSIS — I10 Essential (primary) hypertension: Secondary | ICD-10-CM

## 2018-08-08 LAB — COMPREHENSIVE METABOLIC PANEL
ALT: 24 U/L (ref 0–53)
AST: 23 U/L (ref 0–37)
Albumin: 4.7 g/dL (ref 3.5–5.2)
Alkaline Phosphatase: 59 U/L (ref 39–117)
BUN: 20 mg/dL (ref 6–23)
CO2: 26 mEq/L (ref 19–32)
Calcium: 9.6 mg/dL (ref 8.4–10.5)
Chloride: 105 mEq/L (ref 96–112)
Creatinine, Ser: 1.33 mg/dL (ref 0.40–1.50)
GFR: 52.06 mL/min — ABNORMAL LOW (ref >60.00)
Glucose, Bld: 111 mg/dL — ABNORMAL HIGH (ref 70–99)
Potassium: 4.8 mEq/L (ref 3.5–5.1)
Sodium: 139 mEq/L (ref 135–145)
Total Bilirubin: 1.1 mg/dL (ref 0.2–1.2)
Total Protein: 7.1 g/dL (ref 6.0–8.3)

## 2018-08-08 LAB — LIPID PANEL
Cholesterol: 171 mg/dL (ref 0–200)
HDL: 58.2 mg/dL (ref >39.00)
LDL Cholesterol: 84 mg/dL (ref 0–99)
NonHDL: 112.9
Total CHOL/HDL Ratio: 3
Triglycerides: 143 mg/dL (ref 0.0–149.0)
VLDL: 28.6 mg/dL (ref 0.0–40.0)

## 2018-08-08 LAB — HEMOGLOBIN A1C: Hgb A1c MFr Bld: 5.9 % (ref 4.6–6.5)

## 2018-08-15 ENCOUNTER — Ambulatory Visit (INDEPENDENT_AMBULATORY_CARE_PROVIDER_SITE_OTHER): Payer: Medicare Other | Admitting: Family Medicine

## 2018-08-15 ENCOUNTER — Other Ambulatory Visit: Payer: Self-pay

## 2018-08-15 ENCOUNTER — Encounter: Payer: Self-pay | Admitting: Family Medicine

## 2018-08-15 VITALS — BP 120/70 | HR 78 | Temp 98.4°F | Ht 67.0 in | Wt 183.0 lb

## 2018-08-15 DIAGNOSIS — E785 Hyperlipidemia, unspecified: Secondary | ICD-10-CM | POA: Diagnosis not present

## 2018-08-15 DIAGNOSIS — M545 Low back pain, unspecified: Secondary | ICD-10-CM

## 2018-08-15 DIAGNOSIS — G8929 Other chronic pain: Secondary | ICD-10-CM

## 2018-08-15 DIAGNOSIS — R351 Nocturia: Secondary | ICD-10-CM | POA: Diagnosis not present

## 2018-08-15 DIAGNOSIS — K219 Gastro-esophageal reflux disease without esophagitis: Secondary | ICD-10-CM | POA: Diagnosis not present

## 2018-08-15 DIAGNOSIS — I10 Essential (primary) hypertension: Secondary | ICD-10-CM | POA: Diagnosis not present

## 2018-08-15 MED ORDER — TAMSULOSIN HCL 0.4 MG PO CAPS: 0.4000 mg | ORAL_CAPSULE | Freq: Every day | ORAL | 2 refills | Status: DC

## 2018-08-15 MED ORDER — METOPROLOL SUCCINATE ER 50 MG PO TB24: 50.0000 mg | ORAL_TABLET | Freq: Every day | ORAL | 3 refills | Status: DC

## 2018-08-15 MED ORDER — ATORVASTATIN CALCIUM 20 MG PO TABS: 20.0000 mg | ORAL_TABLET | Freq: Every day | ORAL | 3 refills | Status: DC

## 2018-08-15 MED ORDER — LOSARTAN POTASSIUM 25 MG PO TABS: 25.0000 mg | ORAL_TABLET | Freq: Every day | ORAL | 3 refills | Status: DC

## 2018-08-15 NOTE — Progress Notes (Signed)
Chief Complaint  Patient presents with  . Follow-up    Subjective Brandon Bradford is a 78 y.o. male who presents for hypertension follow up. He does monitor home blood pressures. Blood pressures ranging from 120's/70-80's on average. He is compliant with medications- Toprol XL 50 mg/d, losartan 25 mg/d. Patient has these side effects of medication: none He is sometimes adhering to a healthy diet overall. Current exercise: golfing  Hyperlipidemia Patient presents for dyslipidemia follow up. Currently being treated with Lipitor 20 mg/d and compliance with treatment thus far has been good. He denies myalgias. He is sometimes adhering to a healthy diet. Exercise: golfing The patient is not known to have coexisting coronary artery disease.  GERD- controlled on Nexium 20 mg/d. No AE's, reports compliance.  10 yrs of R lower back pain. Started after he received oral contrast for a scan. Labs have been normal, alleged scan was normal. No other testing. No providers have been able to find out issue. Denies bruising, swelling, numbness, tingling, weakness, bowel incontinence. No current pain. No seemingly consistent triggers.  +urinary freq at night. Will go 4-5 times at night, affecting sleep.    Past Medical History:  Diagnosis Date  . AAA (abdominal aortic aneurysm) (HCC)    3.4 cm  . Abnormal glucose 07/22/09   A1c 6.1  . Cataract 06/2016   bilateral cataract extraction  . Colitis, ischemic ([place])   . Gastritis   . GERD (gastroesophageal reflux disease)   . Hx of colonic polyps   . Hyperlipidemia   . Hypertension   . Paroxysmal atrial fibrillation (HCC)   . Vertigo, intermittent     Review of Systems Cardiovascular: no chest pain Respiratory:  no shortness of breath GI: No bowel incontinence GU: +freq  Const: No fevers Skin: No redness Heme: No bruising Neuro: No weakness Eyes: no vision changes Psych: +insomnia  Exam BP 120/70 (BP Location: Left Arm, Patient Position:  Sitting, Cuff Size: Normal)   Pulse 78   Temp 98.4 F (36.9 C) (Oral)   Ht 5\' 7"  (1.702 m)   Wt 183 lb (83 kg)   SpO2 93%   BMI 28.66 kg/m  General:  well developed, well nourished, in no apparent distress Heart: RRR, no bruits, no LE edema Lungs: clear to auscultation, no accessory muscle use MSK: No ttp, 5/5 strength throughout in LEs Neuro: DTR's equal and symmetric in LE's.  Psych: well oriented with normal range of affect and appropriate judgment/insight  Essential hypertension - Plan: cont meds  Hyperlipidemia, unspecified hyperlipidemia type - Plan: cont statin  Gastroesophageal reflux disease, esophagitis presence not specified - Plan: contPPI  Chronic right-sided low back pain without sciatica - Plan: could be QL vs slipping rib 11th rib. Offered PT and sports med referral, pt and wife declined.   Nocturia - Plan: start Flomax. F/u in 1 mo.   Counseled on diet and exercise. F/u in 1 mo to reck nocturia.. The patient voiced understanding and agreement to the plan.  [place], DO 08/15/18  4:10 PM

## 2018-08-15 NOTE — Patient Instructions (Addendum)
Keep the diet clean and stay active.  If you want to dig deeper with your back, let me know.  Don't drink within 1 hr of bedtime. Try to avoid caffeine after noon and avoid alcohol to help with the urination.   Let us know if you need anything.

## 2018-08-23 ENCOUNTER — Ambulatory Visit
Admission: RE | Admit: 2018-08-23 | Discharge: 2018-08-23 | Disposition: A | Discharge status: Home / Self Care | Payer: Medicare Other | Source: Ambulatory Visit | Attending: Nephrology | Admitting: Nephrology

## 2018-08-23 DIAGNOSIS — N281 Cyst of kidney, acquired: Secondary | ICD-10-CM | POA: Diagnosis not present

## 2018-08-23 DIAGNOSIS — N183 Chronic kidney disease, stage 3 unspecified: Secondary | ICD-10-CM

## 2018-09-16 ENCOUNTER — Ambulatory Visit: Payer: Medicare Other | Admitting: Family Medicine

## 2018-09-19 ENCOUNTER — Encounter: Payer: Self-pay | Admitting: Family Medicine

## 2018-09-19 ENCOUNTER — Other Ambulatory Visit: Payer: Self-pay

## 2018-09-19 ENCOUNTER — Ambulatory Visit (INDEPENDENT_AMBULATORY_CARE_PROVIDER_SITE_OTHER): Payer: Medicare Other | Admitting: Family Medicine

## 2018-09-19 VITALS — BP 128/68 | HR 66 | Temp 96.7°F | Ht 67.0 in | Wt 186.0 lb

## 2018-09-19 DIAGNOSIS — N401 Enlarged prostate with lower urinary tract symptoms: Secondary | ICD-10-CM | POA: Diagnosis not present

## 2018-09-19 DIAGNOSIS — R351 Nocturia: Secondary | ICD-10-CM

## 2018-09-19 MED ORDER — TAMSULOSIN HCL 0.4 MG PO CAPS: 0.8000 mg | ORAL_CAPSULE | Freq: Every day | ORAL | 2 refills | Status: DC

## 2018-09-19 NOTE — Progress Notes (Signed)
Chief Complaint  Patient presents with  . Follow-up    Subjective: Patient is a 78 y.o. male here for urinary frequency. Here w wife.   Was going around 5x/night and after starting Flomax will go around 3-4 times nightly. He is not having any AE's. Taking daily.    ROS: GU: As noted in HPI  Past Medical History:  Diagnosis Date  . AAA (abdominal aortic aneurysm) (HCC)    3.4 cm  . Abnormal glucose 07/22/09   A1c 6.1  . Cataract 06/2016   bilateral cataract extraction  . Colitis, ischemic ([place])   . Gastritis   . GERD (gastroesophageal reflux disease)   . Hx of colonic polyps   . Hyperlipidemia   . Hypertension   . Paroxysmal atrial fibrillation (HCC)   . Vertigo, intermittent     Objective: BP 128/68 (BP Location: Left Arm, Patient Position: Sitting, Cuff Size: Normal)   Pulse 66   Temp (!) 96.7 F (35.9 C) (Temporal)   Ht 5\' 7"  (1.702 m)   Wt 186 lb (84.4 kg)   SpO2 96%   BMI 29.13 kg/m  General: Awake, appears stated age HEENT: MMM, EOMi Heart: RRR, no LE edema Abd: S, NT, ND Lungs: CTAB, no rales, wheezes or rhonchi. No accessory muscle use Psych: Age appropriate judgment and insight, normal affect and mood  Assessment and Plan: Benign prostatic hyperplasia with nocturia - Plan: tamsulosin (FLOMAX) 0.4 MG CAPS capsule; increase to take 2 caps at night.  Reck in 5 weeks. The patient and his wife voiced understanding and agreement to the plan.  [place], DO 09/19/18  4:07 PM

## 2018-09-19 NOTE — Patient Instructions (Signed)
I recommend getting the flu shot in mid October. This suggestion would change if the CDC comes out with a different recommendation.   Avoid alcohol and excessive caffeine after noon.  Let us know if you need anything.

## 2018-10-24 ENCOUNTER — Encounter: Payer: Self-pay | Admitting: Family Medicine

## 2018-10-24 ENCOUNTER — Other Ambulatory Visit: Payer: Self-pay

## 2018-10-24 ENCOUNTER — Ambulatory Visit (INDEPENDENT_AMBULATORY_CARE_PROVIDER_SITE_OTHER): Payer: Medicare Other | Admitting: Family Medicine

## 2018-10-24 VITALS — BP 138/76 | HR 70 | Temp 97.6°F | Ht 67.0 in | Wt 186.0 lb

## 2018-10-24 DIAGNOSIS — N401 Enlarged prostate with lower urinary tract symptoms: Secondary | ICD-10-CM | POA: Diagnosis not present

## 2018-10-24 DIAGNOSIS — Z23 Encounter for immunization: Secondary | ICD-10-CM

## 2018-10-24 DIAGNOSIS — K929 Disease of digestive system, unspecified: Secondary | ICD-10-CM

## 2018-10-24 DIAGNOSIS — Z711 Person with feared health complaint in whom no diagnosis is made: Secondary | ICD-10-CM

## 2018-10-24 DIAGNOSIS — R351 Nocturia: Secondary | ICD-10-CM

## 2018-10-24 MED ORDER — PANCRELIPASE (LIP-PROT-AMYL) 24000-76000 UNITS PO CPEP: 1.0000 | ORAL_CAPSULE | Freq: Three times a day (TID) | ORAL | 3 refills | Status: DC

## 2018-10-24 NOTE — Patient Instructions (Addendum)
Don't worry about your tongue.  Keep on the tamsulosin.  Let us know if you need anything.

## 2018-10-24 NOTE — Progress Notes (Signed)
Chief Complaint  Patient presents with  . Follow-up    Subjective: Patient is a 78 y.o. male here for f/u urination.  BPH Flomax increaesd from qd to bid. Urinating around 2x/night, down from 3-4. No AE's. Taking daily. Pleased w progress.  Hx of abd issues. Doc in [place] had him and his wife on Creon. Did well, requesting a refill. No AE's.  Hx of "ridges" in tongue. No pain, loss of taste, injury, caustic exposure.    ROS: GU: As noted in HPI GI: No current pain  Past Medical History:  Diagnosis Date  . AAA (abdominal aortic aneurysm) (HCC)    3.4 cm  . Abnormal glucose 07/22/09   A1c 6.1  . Cataract 06/2016   bilateral cataract extraction  . Colitis, ischemic ([place])   . Gastritis   . GERD (gastroesophageal reflux disease)   . Hx of colonic polyps   . Hyperlipidemia   . Hypertension   . Paroxysmal atrial fibrillation (HCC)   . Vertigo, intermittent     Objective: BP 138/76 (BP Location: Left Arm, Patient Position: Sitting, Cuff Size: Normal)   Pulse 70   Temp 97.6 F (36.4 C) (Oral)   Ht 5\' 7"  (1.702 m)   Wt 186 lb (84.4 kg)   SpO2 95%   BMI 29.13 kg/m  General: Awake, appears stated age HEENT: MMM, tongue with some ridges without erythema or tears; EOMi Heart: RRR, no murmurs Lungs: CTAB, no rales, wheezes or rhonchi. No accessory muscle use Abd: S, NT, ND Psych: Age appropriate judgment and insight, normal affect and mood  Assessment and Plan: Benign prostatic hyperplasia with nocturia  Digestive problems - Plan: Pancrelipase, Lip-Prot-Amyl, 24000-76000 units CPEP  Worried well  Need for influenza vaccination - Plan: Flu Vaccine QUAD High Dose(Fluad)  1- Cont Flomax bid 2- Cont Creon 3- reassurance, could be geographic tongue, not worrisome though F/u in 6 mo or prn The patient voiced understanding and agreement to the plan.  [place], DO 10/24/18  4:54 PM

## 2018-11-07 ENCOUNTER — Other Ambulatory Visit: Payer: Self-pay | Admitting: Family Medicine

## 2018-11-07 DIAGNOSIS — R351 Nocturia: Secondary | ICD-10-CM

## 2018-11-07 DIAGNOSIS — N401 Enlarged prostate with lower urinary tract symptoms: Secondary | ICD-10-CM

## 2018-11-14 ENCOUNTER — Ambulatory Visit (INDEPENDENT_AMBULATORY_CARE_PROVIDER_SITE_OTHER): Payer: Medicare Other | Admitting: Family Medicine

## 2018-11-14 ENCOUNTER — Encounter: Payer: Self-pay | Admitting: Family Medicine

## 2018-11-14 ENCOUNTER — Other Ambulatory Visit: Payer: Self-pay

## 2018-11-14 VITALS — BP 126/67 | HR 69 | Temp 96.9°F | Resp 12 | Ht 67.0 in | Wt 184.0 lb

## 2018-11-14 DIAGNOSIS — M109 Gout, unspecified: Secondary | ICD-10-CM

## 2018-11-14 MED ORDER — COLCHICINE 0.6 MG PO TABS: ORAL_TABLET | ORAL | 1 refills | Status: DC

## 2018-11-14 NOTE — Progress Notes (Signed)
Musculoskeletal Exam  Patient: Brandon Bradford DOB: [birth year]  DOS: 11/14/2018  SUBJECTIVE:  Chief Complaint:   Chief Complaint  Patient presents with  . Gout    right big toe    Brandon Bradford is a 78 y.o.  male for evaluation and treatment of R great toe pain.  He is here with his wife.  Onset:  2 days ago. No inj or change in activity.  Location: R great toe base Character:  sharp and stabbing  Progression of issue:  has slightly improved Associated symptoms: redness, swelling, pain w ambulation Treatment: to date has been OTC NSAIDS.   Neurovascular symptoms: no Has a history of gout flares which take place every 3 months on average.  No recent dietary changes.  He is not on a daily medication.  ROS: Musculoskeletal/Extremities: +R great toe pain  Past Medical History:  Diagnosis Date  . AAA (abdominal aortic aneurysm) (HCC)    3.4 cm  . Abnormal glucose 07/22/09   A1c 6.1  . Cataract 06/2016   bilateral cataract extraction  . Colitis, ischemic ([place])   . Gastritis   . GERD (gastroesophageal reflux disease)   . Hx of colonic polyps   . Hyperlipidemia   . Hypertension   . Paroxysmal atrial fibrillation (HCC)   . Vertigo, intermittent     Objective: VITAL SIGNS: BP 126/67 (BP Location: Left Arm, Cuff Size: Large)   Pulse 69   Temp (!) 96.9 F (36.1 C) (Temporal)   Resp 12   Ht 5\' 7"  (1.702 m)   Wt 184 lb (83.5 kg)   SpO2 97%   BMI 28.82 kg/m  Constitutional: Well formed, well developed. No acute distress. Cardiovascular: Brisk cap refill Thorax & Lungs: No accessory muscle use Musculoskeletal: R great toe.   Normal active range of motion: yes.   Normal passive range of motion: yes Tenderness to palpation: Yes, over right MTP Deformity: no Ecchymosis: no There is erythema and some swelling around the joint Neurologic: Normal sensory function. No focal deficits noted.  Psychiatric: Normal mood. Age appropriate judgment and insight. Alert & oriented x 3.     Assessment:  Acute gout involving toe of right foot, unspecified cause - Plan: colchicine 0.6 MG tablet  Plan: Take 1 tab and repeat in 1 hour. Take daily until s/s's resolve. Ice, Tylenol, gout diet handout provided.  F/u prn for this. The patient and his spouse voiced understanding and agreement to the plan.   [place], DO 11/14/18  11:35 AM

## 2018-11-14 NOTE — Patient Instructions (Addendum)
Ice/cold pack over area for 10-15 min twice daily.  OK to take Tylenol 1000 mg (2 extra strength tabs) or 975 mg (3 regular strength tabs) every 6 hours as needed.  Stay hydrated when taking this medicine.  Foods to AVOID: Red meat, organ meat (liver), lunch meat, seafood (mussels, scallops, anchovies, etc) Alcohol Sugary foods/beverages (diet soft drinks have no link to flares)  Foods to migrate to: Dairy Vegetables Cherries have limited data to suggest they help lower uric acid levels (and prevent flares) Vit C (500 mg daily) may have a modest effect with preventing flares Poultry If you are going to eat red meat, beef and pork may give you less problems than lamb.  Let us know if you need anything.

## 2018-12-03 ENCOUNTER — Other Ambulatory Visit: Payer: Self-pay | Admitting: Family Medicine

## 2018-12-03 DIAGNOSIS — N401 Enlarged prostate with lower urinary tract symptoms: Secondary | ICD-10-CM

## 2018-12-07 ENCOUNTER — Other Ambulatory Visit: Payer: Self-pay | Admitting: Family Medicine

## 2018-12-07 DIAGNOSIS — M109 Gout, unspecified: Secondary | ICD-10-CM

## 2018-12-08 ENCOUNTER — Telehealth: Payer: Self-pay | Admitting: Family Medicine

## 2018-12-08 NOTE — Telephone Encounter (Signed)
Called patient to schedule AWV, but no answer. Will try to call patient back at a later time. SF 

## 2019-01-05 ENCOUNTER — Other Ambulatory Visit: Payer: Self-pay | Admitting: Family Medicine

## 2019-01-05 DIAGNOSIS — M109 Gout, unspecified: Secondary | ICD-10-CM

## 2019-01-05 NOTE — Telephone Encounter (Signed)
Last OV 11/14/18 Last refill 12/07/18 #30/1 Next OV 04/24/19

## 2019-01-26 DIAGNOSIS — N183 Chronic kidney disease, stage 3 unspecified: Secondary | ICD-10-CM | POA: Diagnosis not present

## 2019-01-26 DIAGNOSIS — M109 Gout, unspecified: Secondary | ICD-10-CM | POA: Diagnosis not present

## 2019-01-26 DIAGNOSIS — R351 Nocturia: Secondary | ICD-10-CM | POA: Diagnosis not present

## 2019-01-26 DIAGNOSIS — R109 Unspecified abdominal pain: Secondary | ICD-10-CM | POA: Diagnosis not present

## 2019-01-26 DIAGNOSIS — I714 Abdominal aortic aneurysm, without rupture: Secondary | ICD-10-CM | POA: Diagnosis not present

## 2019-01-26 DIAGNOSIS — K2970 Gastritis, unspecified, without bleeding: Secondary | ICD-10-CM | POA: Diagnosis not present

## 2019-01-26 DIAGNOSIS — Z9049 Acquired absence of other specified parts of digestive tract: Secondary | ICD-10-CM | POA: Diagnosis not present

## 2019-01-26 DIAGNOSIS — I129 Hypertensive chronic kidney disease with stage 1 through stage 4 chronic kidney disease, or unspecified chronic kidney disease: Secondary | ICD-10-CM | POA: Diagnosis not present

## 2019-01-26 DIAGNOSIS — R739 Hyperglycemia, unspecified: Secondary | ICD-10-CM | POA: Diagnosis not present

## 2019-02-13 ENCOUNTER — Other Ambulatory Visit: Payer: Self-pay | Admitting: Family Medicine

## 2019-02-13 DIAGNOSIS — M109 Gout, unspecified: Secondary | ICD-10-CM

## 2019-02-27 ENCOUNTER — Other Ambulatory Visit: Payer: Self-pay | Admitting: Family Medicine

## 2019-02-27 DIAGNOSIS — M109 Gout, unspecified: Secondary | ICD-10-CM

## 2019-03-21 ENCOUNTER — Ambulatory Visit (INDEPENDENT_AMBULATORY_CARE_PROVIDER_SITE_OTHER): Payer: Medicare Other | Admitting: Family Medicine

## 2019-03-21 ENCOUNTER — Other Ambulatory Visit: Payer: Self-pay

## 2019-03-21 ENCOUNTER — Encounter: Payer: Self-pay | Admitting: Family Medicine

## 2019-03-21 VITALS — BP 126/80 | HR 78 | Temp 95.4°F | Resp 16 | Ht 67.0 in | Wt 177.0 lb

## 2019-03-21 DIAGNOSIS — E785 Hyperlipidemia, unspecified: Secondary | ICD-10-CM | POA: Diagnosis not present

## 2019-03-21 DIAGNOSIS — M1A9XX0 Chronic gout, unspecified, without tophus (tophi): Secondary | ICD-10-CM

## 2019-03-21 DIAGNOSIS — M109 Gout, unspecified: Secondary | ICD-10-CM | POA: Insufficient documentation

## 2019-03-21 DIAGNOSIS — R739 Hyperglycemia, unspecified: Secondary | ICD-10-CM

## 2019-03-21 LAB — COMPREHENSIVE METABOLIC PANEL
ALT: 18 U/L (ref 0–53)
AST: 24 U/L (ref 0–37)
Albumin: 4.5 g/dL (ref 3.5–5.2)
Alkaline Phosphatase: 56 U/L (ref 39–117)
BUN: 32 mg/dL — ABNORMAL HIGH (ref 6–23)
CO2: 27 mEq/L (ref 19–32)
Calcium: 10 mg/dL (ref 8.4–10.5)
Chloride: 104 mEq/L (ref 96–112)
Creatinine, Ser: 1.35 mg/dL (ref 0.40–1.50)
GFR: 51.09 mL/min — ABNORMAL LOW (ref >60.00)
Glucose, Bld: 119 mg/dL — ABNORMAL HIGH (ref 70–99)
Potassium: 4.3 mEq/L (ref 3.5–5.1)
Sodium: 139 mEq/L (ref 135–145)
Total Bilirubin: 1.3 mg/dL — ABNORMAL HIGH (ref 0.2–1.2)
Total Protein: 7.4 g/dL (ref 6.0–8.3)

## 2019-03-21 LAB — LIPID PANEL
Cholesterol: 156 mg/dL (ref 0–200)
HDL: 63.4 mg/dL (ref >39.00)
LDL Cholesterol: 73 mg/dL (ref 0–99)
NonHDL: 92.7
Total CHOL/HDL Ratio: 2
Triglycerides: 97 mg/dL (ref 0.0–149.0)
VLDL: 19.4 mg/dL (ref 0.0–40.0)

## 2019-03-21 LAB — HEMOGLOBIN A1C: Hgb A1c MFr Bld: 5.7 % (ref 4.6–6.5)

## 2019-03-21 MED ORDER — ATORVASTATIN CALCIUM 20 MG PO TABS: 20.0000 mg | ORAL_TABLET | Freq: Every day | ORAL | 3 refills | Status: DC

## 2019-03-21 MED ORDER — LOSARTAN POTASSIUM 25 MG PO TABS: 25.0000 mg | ORAL_TABLET | Freq: Every day | ORAL | 3 refills | Status: DC

## 2019-03-21 MED ORDER — METOPROLOL SUCCINATE ER 50 MG PO TB24: 50.0000 mg | ORAL_TABLET | Freq: Every day | ORAL | 3 refills | Status: DC

## 2019-03-21 MED ORDER — COLCHICINE 0.6 MG PO TABS: 0.6000 mg | ORAL_TABLET | Freq: Every day | ORAL | 3 refills | Status: DC

## 2019-03-21 NOTE — Progress Notes (Signed)
Chief Complaint  Patient presents with  . Hyperglycemia    130 average before eating    Subjective: Patient is a 79 y.o. male here for f/u.  He is here with his wife who helps provide the history.  The patient has a history of prediabetes.  In the last month, his sugars have been in the mid 100s on average.  No change in his diet or exercising.  He is still adhering to a healthy diet overall but does like sweets.  He continues to golf multiple times per week.  Denies chest pain or shortness of breath.  He is not taking any medication for sugar.  Patient has a history of gout.  He takes colchicine 0.6 mg daily.  He is not currently having a flare.  He notes he will have a flare if he eats certain types of food.  Patient has a history of high cholesterol taking Lipitor 20 mg daily.  No side effects.  Diet and exercise as noted above.  No chest pain or shortness of breath.  He is not known to have coronary artery disease.    ROS: Heart: Denies chest pain  Lungs: Denies SOB   Past Medical History:  Diagnosis Date  . AAA (abdominal aortic aneurysm) (HCC)    3.4 cm  . Cataract 06/2016   bilateral cataract extraction  . Colitis, ischemic ([place])   . Gastritis   . GERD (gastroesophageal reflux disease)   . Hx of colonic polyps   . Hyperlipidemia   . Hypertension   . Paroxysmal atrial fibrillation (HCC)   . Vertigo, intermittent     Objective: BP 126/80 (BP Location: Left Arm, Patient Position: Sitting, Cuff Size: Normal)   Pulse 78   Temp (!) 95.4 F (35.2 C) (Temporal)   Resp 16   Ht 5\' 7"  (1.702 m)   Wt 177 lb (80.3 kg)   SpO2 98%   BMI 27.72 kg/m  General: Awake, appears stated age HEENT: MMM, EOMi Heart: RRR, no bruits, no lower extremity edema Lungs: CTAB, no rales, wheezes or rhonchi. No accessory muscle use Psych: Age appropriate judgment and insight, normal affect and mood  Assessment and Plan: Hyperglycemia - Plan: Hemoglobin A1c  Hyperlipidemia, unspecified  hyperlipidemia type - Plan: Comprehensive metabolic panel, Lipid panel  Chronic gout without tophus, unspecified cause, unspecified site - Plan: colchicine 0.6 MG tablet  1-if his A1c has increased but is still in the prediabetic range, will offer Metformin.  Counseled on diet and exercise. 2-continue statin.  Check today. 3-continue colchicine. Follow-up in 6 months. The patient and his wife voiced understanding and agreement to the plan.  [place], DO 03/21/19  12:02 PM

## 2019-03-21 NOTE — Patient Instructions (Addendum)
Give [place] 2-3 business days to get the results of your labs back.   Keep the diet clean and stay active.  Try to drink 55-60 oz of water daily.   Let us know if you need anything.

## 2019-04-19 ENCOUNTER — Telehealth: Payer: Self-pay | Admitting: Family Medicine

## 2019-04-19 NOTE — Telephone Encounter (Signed)
Lab results/printed and mailed as requested

## 2019-04-19 NOTE — Telephone Encounter (Signed)
Brandon Bradford and  Requested that Mr Blue Most recent lab results be sent to her by mail .

## 2019-04-24 ENCOUNTER — Ambulatory Visit: Payer: Medicare Other | Admitting: Family Medicine

## 2019-07-29 ENCOUNTER — Other Ambulatory Visit: Payer: Self-pay

## 2019-07-29 ENCOUNTER — Encounter (HOSPITAL_BASED_OUTPATIENT_CLINIC_OR_DEPARTMENT_OTHER): Payer: Self-pay | Admitting: Emergency Medicine

## 2019-07-29 ENCOUNTER — Emergency Department (HOSPITAL_BASED_OUTPATIENT_CLINIC_OR_DEPARTMENT_OTHER): Payer: Medicare Other

## 2019-07-29 ENCOUNTER — Other Ambulatory Visit: Payer: Self-pay | Admitting: Cardiology

## 2019-07-29 ENCOUNTER — Emergency Department (HOSPITAL_BASED_OUTPATIENT_CLINIC_OR_DEPARTMENT_OTHER)
Admission: EM | Admit: 2019-07-29 | Discharge: 2019-07-29 | Disposition: A | Discharge status: Home / Self Care | Payer: Medicare Other | Attending: Emergency Medicine | Admitting: Emergency Medicine

## 2019-07-29 DIAGNOSIS — R072 Precordial pain: Secondary | ICD-10-CM | POA: Diagnosis not present

## 2019-07-29 DIAGNOSIS — I10 Essential (primary) hypertension: Secondary | ICD-10-CM | POA: Diagnosis not present

## 2019-07-29 DIAGNOSIS — R079 Chest pain, unspecified: Secondary | ICD-10-CM | POA: Diagnosis not present

## 2019-07-29 DIAGNOSIS — Z87891 Personal history of nicotine dependence: Secondary | ICD-10-CM | POA: Diagnosis not present

## 2019-07-29 DIAGNOSIS — R42 Dizziness and giddiness: Secondary | ICD-10-CM | POA: Insufficient documentation

## 2019-07-29 DIAGNOSIS — R0789 Other chest pain: Secondary | ICD-10-CM | POA: Diagnosis present

## 2019-07-29 DIAGNOSIS — R7303 Prediabetes: Secondary | ICD-10-CM | POA: Insufficient documentation

## 2019-07-29 LAB — HEPATIC FUNCTION PANEL
ALT: 24 U/L (ref 0–44)
AST: 31 U/L (ref 15–41)
Albumin: 4.6 g/dL (ref 3.5–5.0)
Alkaline Phosphatase: 86 U/L (ref 38–126)
Bilirubin, Direct: 0.2 mg/dL (ref 0.0–0.2)
Indirect Bilirubin: 0.7 mg/dL (ref 0.3–0.9)
Total Bilirubin: 0.9 mg/dL (ref 0.3–1.2)
Total Protein: 7.5 g/dL (ref 6.5–8.1)

## 2019-07-29 LAB — CBC
HCT: 45.4 % (ref 39.0–52.0)
Hemoglobin: 15.2 g/dL (ref 13.0–17.0)
MCH: 32.8 pg (ref 26.0–34.0)
MCHC: 33.5 g/dL (ref 30.0–36.0)
MCV: 98.1 fL (ref 80.0–100.0)
Platelets: 216 10*3/uL (ref 150–400)
RBC: 4.63 MIL/uL (ref 4.22–5.81)
RDW: 11.9 % (ref 11.5–15.5)
WBC: 7.9 10*3/uL (ref 4.0–10.5)
nRBC: 0 % (ref 0.0–0.2)

## 2019-07-29 LAB — BASIC METABOLIC PANEL
Anion gap: 9 (ref 5–15)
BUN: 31 mg/dL — ABNORMAL HIGH (ref 8–23)
CO2: 25 mmol/L (ref 22–32)
Calcium: 9.5 mg/dL (ref 8.9–10.3)
Chloride: 103 mmol/L (ref 98–111)
Creatinine, Ser: 1.32 mg/dL — ABNORMAL HIGH (ref 0.61–1.24)
GFR calc Af Amer: 59 mL/min — ABNORMAL LOW (ref >60)
GFR calc non Af Amer: 51 mL/min — ABNORMAL LOW (ref >60)
Glucose, Bld: 141 mg/dL — ABNORMAL HIGH (ref 70–99)
Potassium: 4.4 mmol/L (ref 3.5–5.1)
Sodium: 137 mmol/L (ref 135–145)

## 2019-07-29 LAB — TROPONIN I (HIGH SENSITIVITY)
Troponin I (High Sensitivity): 6 ng/L (ref <18)
Troponin I (High Sensitivity): 5 ng/L (ref <18)

## 2019-07-29 LAB — LIPASE, BLOOD: Lipase: 45 U/L (ref 11–51)

## 2019-07-29 MED ORDER — NITROGLYCERIN 0.4 MG SL SUBL
0.4000 mg | SUBLINGUAL_TABLET | SUBLINGUAL | Status: DC | PRN
  Administered 2019-07-29: 0.4 mg via SUBLINGUAL
  Filled 2019-07-29: qty 1

## 2019-07-29 MED ORDER — ASPIRIN 81 MG PO CHEW
324.0000 mg | CHEWABLE_TABLET | Freq: Once | ORAL | Status: AC
  Administered 2019-07-29: 324 mg via ORAL
  Filled 2019-07-29: qty 4

## 2019-07-29 NOTE — ED Provider Notes (Signed)
Emergency Department Provider Note   I have reviewed the triage vital signs and the nursing notes.   HISTORY  Chief Complaint Chest Pain   HPI Brandon Bradford is a 79 y.o. male with past history reviewed below presents to the emergency department for evaluation of left-sided chest pain over the past 4 to 5 days.  The triage note states 2 days but patient states actually it has been 4-5.  He has a central to left-sided chest tightness/heaviness.  He notices the symptoms more at rest.  He has been up and playing golf and does not get pain with walking or exertion.  He also endorses some lightheadedness.  He denies any vision change, numbness, weakness.  No associated shortness of breath.  No similar pain in the past.  He does have a history of atrial fibrillation but is not anticoagulated.  His primary cardiologist is Dr. Einar Gip.  He has been vaccinated for COVID-19.  No radiation of symptoms or other modifying factors. Wife at bedside and provides some history as well.    Past Medical History:  Diagnosis Date  . AAA (abdominal aortic aneurysm) (HCC)    3.4 cm  . Cataract 06/2016   bilateral cataract extraction  . Colitis, ischemic ([place])   . Gastritis   . GERD (gastroesophageal reflux disease)   . Hx of colonic polyps   . Hyperlipidemia   . Hypertension   . Paroxysmal atrial fibrillation (HCC)   . Vertigo, intermittent     Patient Active Problem List   Diagnosis Date Noted  . Acute gout involving toe of right foot 03/21/2019  . Chronic gout without tophus 03/21/2019  . Digestive problems 10/24/2018  . Prediabetes 02/14/2018  . Regurgitation of food 02/15/2017  . Metatarsal deformity 09/04/2014  . Arthralgia of foot 09/04/2014  . Neuralgia 09/04/2014  . Enteritis due to Norovirus 03/31/2013  . Enteritis 03/29/2013  . Sepsis ([place]) 03/29/2013  . AKI (acute kidney injury) ([place]) 03/29/2013  . Heart palpitations 12/29/2011  . Atypical chest pain 10/05/2011  . Erectile dysfunction  11/10/2010  . Chest pain 08/27/2010  . KNEE PAIN 02/17/2010  . ATRIAL FIBRILLATION, PAROXYSMAL 08/19/2009  . SMALL BOWEL OBSTRUCTION 08/19/2009  . PRE-DIABETES 08/19/2009  . 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 ABDOMINAL AORTIC ANEURYSM 04/04/2009  . BPH (benign prostatic hyperplasia) 04/04/2009  . GERD 01/31/2009  . BACK PAIN, THORACIC REGION, RIGHT 01/31/2009  . Hyperlipidemia 10/15/2008  . Essential hypertension 10/15/2008    Past Surgical History:  Procedure Laterality Date  . APPENDECTOMY  1971  . CATARACT EXTRACTION, BILATERAL    . HEMICOLECTOMY     Right  . HEMORROIDECTOMY  2002    Allergies Patient has no known allergies.  Family History  Problem Relation Age of Onset  . Colon cancer Neg Hx     Social History Social History   Tobacco Use  . Smoking status: Former Smoker    Quit date: 10/13/1995    Years since quitting: 23.8  . Smokeless tobacco: Never Used  Vaping Use  . Vaping Use: Never used  Substance Use Topics  . Alcohol use: No    Alcohol/week: 0.0 standard drinks  . Drug use: No    Review of Systems  Constitutional: No fever/chills Eyes: No visual changes. ENT: No sore throat. Cardiovascular: Positive chest pain and dizziness.  Respiratory: Denies shortness of breath. Gastrointestinal: No abdominal pain.  No nausea, no vomiting.  No diarrhea.  No constipation. Genitourinary: Negative for dysuria. Musculoskeletal: Negative for back pain.  Skin: Negative for rash. Neurological: Negative for headaches, focal weakness or numbness.  10-point ROS otherwise negative.  ____________________________________________   PHYSICAL EXAM:  VITAL SIGNS: ED Triage Vitals  Enc Vitals Group     BP 07/29/19 1741 (!) 155/79     Pulse Rate 07/29/19 1741 71     Resp 07/29/19 1741 18     Temp 07/29/19 1743 98.2 F (36.8 C)     Temp Source 07/29/19 1743 Oral     SpO2 07/29/19 1741 98 %     Weight 07/29/19 1744 180 lb (81.6 kg)   Constitutional:  Alert and oriented. Well appearing and in no acute distress. Eyes: Conjunctivae are normal.  Head: Atraumatic. Nose: No congestion/rhinnorhea. Mouth/Throat: Mucous membranes are moist. Neck: No stridor.  Cardiovascular: Normal rate, regular rhythm. Good peripheral circulation. Grossly normal heart sounds.   Respiratory: Normal respiratory effort.  No retractions. Lungs CTAB. Gastrointestinal: Soft and nontender. No distention.  Musculoskeletal: No lower extremity tenderness nor edema. No gross deformities of extremities. Neurologic:  Normal speech and language. No gross focal neurologic deficits are appreciated.  Skin:  Skin is warm, dry and intact. No rash noted.  ____________________________________________   LABS (all labs ordered are listed, but only abnormal results are displayed)  Labs Reviewed  BASIC METABOLIC PANEL - Abnormal; Notable for the following components:      Result Value   Glucose, Bld 141 (*)    BUN 31 (*)    Creatinine, Ser 1.32 (*)    GFR calc non Af Amer 51 (*)    GFR calc Af Amer 59 (*)    All other components within normal limits  CBC  HEPATIC FUNCTION PANEL  LIPASE, BLOOD  TROPONIN I (HIGH SENSITIVITY)  TROPONIN I (HIGH SENSITIVITY)   ____________________________________________  EKG   EKG Interpretation  Date/Time:  Saturday July 29 2019 17:42:59 EDT Ventricular Rate:  75 PR Interval:  152 QRS Duration: 108 QT Interval:  404 QTC Calculation: 451 R Axis:   -46 Text Interpretation: Sinus rhythm with Premature supraventricular complexes Left axis deviation Abnormal ECG No STEMI Confirmed by Nanda Quinton [phone]) on 07/29/2019 5:46:40 PM       ____________________________________________  RADIOLOGY  CXR reviewed without acute findings.  ____________________________________________   PROCEDURES  Procedure(s) performed:   Procedures  None  ____________________________________________   INITIAL IMPRESSION / ASSESSMENT AND PLAN /  ED COURSE  Pertinent labs & imaging results that were available during my care of the patient were reviewed by me and considered in my medical decision making (see chart for details).   Patient presents to the emergency department for evaluation of left-sided chest pain over the past 4 to 5 days.  Symptoms are intermittent.  He does have mild pain at this time with nonischemic EKG. plan for aspirin and nitroglycerin.  Patient is moderate risk by HEART score. Primary Cardiologist if Dr. Einar Gip. Plan for cardiac w/u here. Doubt PE.   Discussed with on-call provided with [facility] regarding troponin, imaging, and patient presentation. Second troponin stable. Risk/benefit discussion with patient and wife had in the ED. Patient will go home with Cardiology follow up on Monday. Discussed strict ED return precautions.  ____________________________________________  FINAL CLINICAL IMPRESSION(S) / ED DIAGNOSES  Final diagnoses:  Precordial chest pain     MEDICATIONS GIVEN DURING THIS VISIT:  Medications  aspirin chewable tablet 324 mg (324 mg Oral Given 07/29/19 1810)    Note:  This document was prepared using Dragon voice recognition software and may include  unintentional dictation errors.  Nanda Quinton, MD, [facility] Emergency Medicine    Kourtlyn Charlet, [facility], MD 08/02/19 [phone]

## 2019-07-29 NOTE — Discharge Instructions (Signed)
You were seen in the emergency department today with chest discomfort.  I spoke with your cardiology team and they would like you to call the office first thing Monday morning to be seen in the office.  Please discuss your symptoms with along with your blood pressure medicines.  If you develop any new or suddenly worsening chest pain you should call 911 and return to the emergency department immediately.

## 2019-07-29 NOTE — ED Triage Notes (Signed)
L side chest pain x 2 days, worsening today. Also endorses dizziness.

## 2019-07-29 NOTE — Progress Notes (Signed)
Pt seen at med center [facility] for Chest Pain evaluation on 07/29/2019.   Schedule for echo and stress test and follow up with either Dr. Einar Gip or myself.   He already had a GXT; therefore, exercise nuclear stress test. If on beta blockers or calcium channel blockers have him hold for 48hrs prior to the stress test.   Rex Kras, DO, [facility]  Pager: [phone] Office: [phone] 07/29/19, 9:41 PM

## 2019-08-03 ENCOUNTER — Other Ambulatory Visit: Payer: Self-pay

## 2019-08-03 ENCOUNTER — Ambulatory Visit: Payer: Medicare Other

## 2019-08-03 DIAGNOSIS — R072 Precordial pain: Secondary | ICD-10-CM

## 2019-08-13 NOTE — Progress Notes (Signed)
Primary Physician/Referring:  Shelda Pal, DO  Patient ID: Brandon Bradford, male    DOB: January 30, 1940, 80 y.o.   MRN: [id number]  Chief Complaint  Patient presents with  . Follow-up   HPI:    Brandon Bradford  is a 79 y.o. Asian male with Patient with past medical history of hypertension, A. 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   He had atypical chest pain and was seen in the emergency room on 07/29/2019.  States that the chest pain was present for 2 days continuously, he used salon spa with resolution of pain.  Denies dyspnea, denies any other specific symptoms today.  Past Medical History:  Diagnosis Date  . AAA (abdominal aortic aneurysm) (HCC)    3.4 cm  . Cataract 06/2016   bilateral cataract extraction  . Colitis, ischemic ([place])   . Gastritis   . GERD (gastroesophageal reflux disease)   . Hx of colonic polyps   . Hyperlipidemia   . Hypertension   . Paroxysmal atrial fibrillation (HCC)   . Vertigo, intermittent    Past Surgical History:  Procedure Laterality Date  . APPENDECTOMY  1971  . CATARACT EXTRACTION, BILATERAL    . HEMICOLECTOMY     Right  . HEMORROIDECTOMY  2002   Family History  Problem Relation Age of Onset  . Colon cancer Neg Hx     Social History   Tobacco Use  . Smoking status: Former Smoker    Quit date: 10/13/1995    Years since quitting: 23.8  . Smokeless tobacco: Never Used  Substance Use Topics  . Alcohol use: No    Alcohol/week: 0.0 standard drinks   Marital Status: Married  ROS  Review of Systems  Cardiovascular: Negative for chest pain, dyspnea on exertion and leg swelling.  Gastrointestinal: Negative for melena.   Objective  Blood pressure (!) 140/82,  pulse 71, resp. rate 16, height 5' 7"  (1.702 m), weight 182 lb (82.6 kg), SpO2 95 %.  Vitals with BMI 08/14/2019 07/29/2019 07/29/2019  Height 5' 7"  - -  Weight 182 lbs - -  BMI 35.0 - -  Systolic 093 818 299  Diastolic 82 89 78  Pulse 71 57 54     Physical Exam Cardiovascular:     Rate and Rhythm: Normal rate and regular rhythm.     Pulses: Intact distal pulses.     Heart sounds: Normal heart sounds. No murmur heard.  No gallop.      Comments: No leg edema, no JVD. Pulmonary:     Effort: Pulmonary effort is normal.     Breath sounds: Normal breath sounds.  Abdominal:     General: Bowel sounds are normal.     Palpations: Abdomen is soft.    Laboratory examination:   Recent Labs    03/21/19 0738 07/29/19 1750  NA 139 137  K 4.3 4.4  CL 104 103  CO2 27 25  GLUCOSE 119* 141*  BUN 32* 31*  CREATININE 1.35 1.32*  CALCIUM 10.0 9.5  GFRNONAA  --  51*  GFRAA  --  59*   estimated creatinine clearance is 47.4 mL/min (A) (by C-G formula based on SCr of 1.32 mg/dL (H)).  CMP Latest Ref Rng & Units 07/29/2019 03/21/2019 08/08/2018  Glucose  70 - 99 mg/dL 141(H) 119(H) 111(H)  BUN 8 - 23 mg/dL 31(H) 32(H) 20  Creatinine 0.61 - 1.24 mg/dL 1.32(H) 1.35 1.33  Sodium 135 - 145 mmol/L 137 139 139  Potassium 3.5 - 5.1 mmol/L 4.4 4.3 4.8  Chloride 98 - 111 mmol/L 103 104 105  CO2 22 - 32 mmol/L 25 27 26   Calcium 8.9 - 10.3 mg/dL 9.5 10.0 9.6  Total Protein 6.5 - 8.1 g/dL 7.5 7.4 7.1  Total Bilirubin 0.3 - 1.2 mg/dL 0.9 1.3(H) 1.1  Alkaline Phos 38 - 126 U/L 86 56 59  AST 15 - 41 U/L 31 24 23   ALT 0 - 44 U/L 24 18 24    CBC Latest Ref Rng & Units 07/29/2019 02/04/2018 08/09/2017  WBC 4.0 - 10.5 K/uL 7.9 6.8 5.5  Hemoglobin 13.0 - 17.0 g/dL 15.2 14.2 14.9  Hematocrit 39 - 52 % 45.4 43.4 43.5  Platelets 150 - 400 K/uL 216 214 194.0    Lipid Panel Recent Labs    03/21/19 0738  CHOL 156  TRIG 97.0  LDLCALC 73  VLDL 19.4  HDL 63.40  CHOLHDL 2    HEMOGLOBIN A1C Lab Results    Component Value Date   HGBA1C 5.7 03/21/2019   MPG 126 (H) 04/28/2010   TSH No results for input(s): TSH in the last 8760 hours.  Medications and allergies  No Known Allergies   Current Outpatient Medications  Medication Instructions  . atorvastatin (LIPITOR) 20 mg, Oral, Daily  . B Complex-C (SUPER B COMPLEX PO) 1 tablet, Oral, Daily  . Cholecalciferol (VITAMIN D3) 5000 units CAPS 1 capsule, Oral, Daily  . colchicine 0.6 mg, Oral, Daily  . esomeprazole (NEXIUM) 20 mg, Oral, As needed  . losartan (COZAAR) 25 mg, Oral, Every evening  . Magnesium 400 MG CAPS 1 tablet, Oral, Daily  . metoprolol succinate (TOPROL-XL) 50 mg, Oral, Daily  . Multiple Vitamin (MULTIVITAMIN) tablet 1 tablet, Daily  . 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  MRI of the abdomen 05/01/2017:  Partially visualized saccular infrarenal abdominal aortic aneurysm measuring at least 4.5 cm.   Chest x-ray PA and lateral view 07/29/2019: The heart size and mediastinal contours are within normal limits. Both lungs are clear. The visualized skeletal structures are unremarkable.  Cardiac Studies:   Abdominal Ultrasound  2017/07/03: Moderate dilatation of the abdominal aorta is noted in the proximal and mid and distal aorta. Largest diameter in the distal abdominal aorta, aneurysm measuring 4.49 x 4.36 x 4.11 cm is seen. Diffuse plaque noted in the proximal, mid and distal aorta. Compared to the study done on 04/28/2016, abdominal aortic size is slightly increased from 4 x 4.01 x 4 cm. Recheck in 6 months.  Exercise Myoview stress test 02/03/2013: 1. Resting  EKG showed normal sinus rhythm, LAD, LAFB, IRBBB. Stress EKG was negative for ischemia. Patient exercised on BRUCE PROTOCOL for 9 minutes 6 seconds. The maximum work level achieved was 9.7 MET's. The test was terminated due to fatigue and achievement of the target heart rate. 2. Perfusion imaging study demonstrates normal perfusion without ischemia or scar. Normal left ventricular ejection fraction. Based on the stress results, continued primary prevention is recommended.  Exercise  Treadmill Stress Test 03/07/2018:  Indication: Chest pain  The patient exercised on Bruce protocol for 7:29 min. Patient achieved 9.34 METS and reached HR 128 bpm, which is 89% of maximum age-predicted HR. Stress test terminated due to fatigue.  Exercise capacity was normal for age. HR Response to Exercise: Appropriate. BP Response to Exercise: Resting hypertesion with normal exercise response.  Chest Pain: none. Arrhythmias: none. Resting EKG demonstrates Normal sinus rhythm with left anterior fascicular block. ST Changes: With peak exercise there was no ST-T changes of ischemia.  Overall Impression: Normal stress test. Recommendations: Continue primary/secondary prevention.   Echocardiogram 08/03/2019: Left ventricle cavity is normal in size. Moderate concentric hypertrophy of the left ventricle. Normal global wall motion. Normal LV systolic function with EF 59%.  Trileaflet aortic valve.  Mild (Grade I) aortic regurgitation. Mild aortic valve leaflet calcification. Mild calcification of the mitral valve annulus. Moderate (Grade II) mitral regurgitation. Moderate tricuspid regurgitation. Estimated pulmonary artery systolic pressure 22 mmHg, noted to be 43 mmHg on previous study on 03/17/2018. Compared to 02/23/2013, mitral regurgitation and tricuspid valve findings are new.    EKG  EKG 08/14/2019: Normal sinus rhythm with rate of 69 bpm, left axis deviation, left anterior fascicular block.  Incomplete  right bundle branch block.  Nonspecific T abnormality.  EKG 07/29/2019: Normal sinus rhythm at the rate of 75 bpm, left axis deviation, left anterior fascicular block.  PAC.  Normal QT interval.  Assessment     ICD-10-CM   1. Musculoskeletal chest pain  R07.89 EKG 12-Lead  2. Abdominal aortic aneurysm (AAA) >39 mm diameter (HCC)  I71.4 PCV AORTA DUPLEX  3. Primary hypertension  I10 losartan (COZAAR) 25 MG tablet  4. Hypercholesteremia  E78.00      Medications Discontinued During This Encounter  Medication Reason  . Probiotic Product (PROBIOTIC-10 PO) Patient Preference  . tamsulosin (FLOMAX) 0.4 MG CAPS capsule Ineffective  . losartan (COZAAR) 25 MG tablet      Recommendations:   Parsa Rickett  is a 79 y.o. Asian male with Patient with past medical history of hypertension, A. 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   He had atypical chest pain and was seen in the emergency room on 07/29/2019.  Chest pain symptoms clearly appear to be musculoskeletal.  Do not think he needs a repeat stress test.  His blood pressure was elevated today although patient states that occasionally has had dizziness and low blood pressure.  Advised him to monitor his blood pressure and bring recordings to me on his next office visit in 6 weeks.  He has not had abdominal aortic duplex for >2 years now and he has a moderate-sized AAA.  We will schedule this.  External records reviewed, external labs reviewed, lipids are under excellent control.  His medical records from the emergency room were also reviewed.  Echocardiogram discussed with the patient.    Adrian Prows, MD, [facility] 08/14/2019, 10:15 AM Office: [phone]

## 2019-08-14 ENCOUNTER — Other Ambulatory Visit: Payer: Self-pay

## 2019-08-14 ENCOUNTER — Encounter: Payer: Self-pay | Admitting: Cardiology

## 2019-08-14 ENCOUNTER — Ambulatory Visit: Payer: Medicare Other | Admitting: Cardiology

## 2019-08-14 VITALS — BP 140/82 | HR 71 | Resp 16 | Ht 67.0 in | Wt 182.0 lb

## 2019-08-14 DIAGNOSIS — R0789 Other chest pain: Secondary | ICD-10-CM | POA: Diagnosis not present

## 2019-08-14 DIAGNOSIS — I10 Essential (primary) hypertension: Secondary | ICD-10-CM | POA: Diagnosis not present

## 2019-08-14 DIAGNOSIS — I714 Abdominal aortic aneurysm, without rupture, unspecified: Secondary | ICD-10-CM

## 2019-08-14 DIAGNOSIS — E7800 Pure hypercholesterolemia, unspecified: Secondary | ICD-10-CM | POA: Diagnosis not present

## 2019-08-14 MED ORDER — LOSARTAN POTASSIUM 25 MG PO TABS: 25.0000 mg | ORAL_TABLET | Freq: Every evening | ORAL | 3 refills | Status: DC

## 2019-08-18 ENCOUNTER — Ambulatory Visit: Payer: Medicare Other

## 2019-08-18 ENCOUNTER — Other Ambulatory Visit: Payer: Self-pay

## 2019-08-18 DIAGNOSIS — I714 Abdominal aortic aneurysm, without rupture, unspecified: Secondary | ICD-10-CM

## 2019-08-20 ENCOUNTER — Other Ambulatory Visit: Payer: Self-pay | Admitting: Cardiology

## 2019-08-20 DIAGNOSIS — I714 Abdominal aortic aneurysm, without rupture, unspecified: Secondary | ICD-10-CM

## 2019-08-22 ENCOUNTER — Telehealth: Payer: Self-pay

## 2019-08-22 NOTE — Progress Notes (Signed)
Unable to leave vm.

## 2019-08-22 NOTE — Progress Notes (Signed)
No change in the AAA. Will recheck in 1 year

## 2019-08-22 NOTE — Telephone Encounter (Signed)
Unable to leave vm.

## 2019-08-23 ENCOUNTER — Telehealth: Payer: Self-pay

## 2019-08-23 NOTE — Telephone Encounter (Signed)
Patient signed DPR on 02/15/2017, Patient authorized Nickey Kloepfer -Wife @ [phone], Patient check deatiled messages can be left on home answering machine [phone].  2nd attempt: Called patient, NA, no VMbox to leave a message.

## 2019-08-23 NOTE — Progress Notes (Signed)
Unable to leave vm. No answer.

## 2019-09-19 DIAGNOSIS — I129 Hypertensive chronic kidney disease with stage 1 through stage 4 chronic kidney disease, or unspecified chronic kidney disease: Secondary | ICD-10-CM | POA: Diagnosis not present

## 2019-09-19 DIAGNOSIS — K2970 Gastritis, unspecified, without bleeding: Secondary | ICD-10-CM | POA: Diagnosis not present

## 2019-09-19 DIAGNOSIS — R739 Hyperglycemia, unspecified: Secondary | ICD-10-CM | POA: Diagnosis not present

## 2019-09-19 DIAGNOSIS — Z9049 Acquired absence of other specified parts of digestive tract: Secondary | ICD-10-CM | POA: Diagnosis not present

## 2019-09-19 DIAGNOSIS — N183 Chronic kidney disease, stage 3 unspecified: Secondary | ICD-10-CM | POA: Diagnosis not present

## 2019-09-19 DIAGNOSIS — R351 Nocturia: Secondary | ICD-10-CM | POA: Diagnosis not present

## 2019-09-19 DIAGNOSIS — M109 Gout, unspecified: Secondary | ICD-10-CM | POA: Diagnosis not present

## 2019-09-19 DIAGNOSIS — R109 Unspecified abdominal pain: Secondary | ICD-10-CM | POA: Diagnosis not present

## 2019-09-19 DIAGNOSIS — I714 Abdominal aortic aneurysm, without rupture: Secondary | ICD-10-CM | POA: Diagnosis not present

## 2019-09-28 ENCOUNTER — Encounter: Payer: Self-pay | Admitting: Cardiology

## 2019-09-28 ENCOUNTER — Ambulatory Visit: Payer: Medicare Other | Admitting: Cardiology

## 2019-09-28 ENCOUNTER — Other Ambulatory Visit: Payer: Self-pay

## 2019-09-28 VITALS — BP 128/78 | HR 71 | Resp 16 | Ht 67.0 in | Wt 179.8 lb

## 2019-09-28 DIAGNOSIS — I714 Abdominal aortic aneurysm, without rupture, unspecified: Secondary | ICD-10-CM

## 2019-09-28 DIAGNOSIS — I10 Essential (primary) hypertension: Secondary | ICD-10-CM

## 2019-09-28 NOTE — Progress Notes (Signed)
Primary Physician/Referring:  Shelda Pal, DO  Patient ID: Brandon Bradford, male    DOB: 08/07/1940, 79 y.o.   MRN: [id number]  Chief Complaint  Patient presents with  . AAA  . Chest Pain  . Hypertension  . Follow-up    6 months   HPI:    Brandon Bradford  is a 79 y.o. Asian male with Patient with past medical history of hypertension, A. 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   Precordial chest pain, abdominal aortic duplex that was recently performed he now presents back to the office. He also had elevated blood pressure on his last office visit. States that he has not had any recurrence of chest pain since his last ED visit on 07/29/2019. He is accompanied by his wife. For follow-up of AAA,  Past Medical History:  Diagnosis Date  . AAA (abdominal aortic aneurysm) (HCC)    3.4 cm  . Cataract 06/2016   bilateral cataract extraction  . Colitis, ischemic ([place])   . Gastritis   . GERD (gastroesophageal reflux disease)   . Hx of colonic polyps   . Hyperlipidemia   . Hypertension   . Paroxysmal atrial fibrillation (HCC)   . Vertigo, intermittent    Past Surgical History:  Procedure Laterality Date  . APPENDECTOMY  1971  . CATARACT EXTRACTION, BILATERAL    . HEMICOLECTOMY     Right  . HEMORROIDECTOMY  2002   Family History  Problem Relation Age of Onset  . Colon cancer Neg Hx     Social History   Tobacco Use  . Smoking status: Former Smoker    Quit date: 10/13/1995    Years since quitting: 23.9  . Smokeless tobacco: Never Used  Substance Use Topics  . Alcohol use: No    Alcohol/week: 0.0 standard drinks   Marital Status: Married  ROS  Review of Systems  Cardiovascular: Negative for chest  pain, dyspnea on exertion and leg swelling.  Gastrointestinal: Negative for melena.   Objective  Blood pressure 128/78, pulse 71, resp. rate 16, height 5' 7"  (1.702 m), weight 179 lb 12.8 oz (81.6 kg), SpO2 97 %.  Vitals with BMI 09/28/2019 08/14/2019 07/29/2019  Height 5' 7"  5' 7"  -  Weight 179 lbs 13 oz 182 lbs -  BMI 53.64 68.0 -  Systolic 321 224 825  Diastolic 78 82 89  Pulse 71 71 57     Physical Exam Cardiovascular:     Rate and Rhythm: Normal rate and regular rhythm.     Pulses: Intact distal pulses.     Heart sounds: Normal heart sounds. No murmur heard.  No gallop.      Comments: No leg edema, no JVD. Pulmonary:     Effort: Pulmonary effort is normal.     Breath sounds: Normal breath sounds.  Abdominal:     General: Bowel sounds are normal.     Palpations: Abdomen is soft.    Laboratory examination:   Recent Labs    03/21/19 0738 07/29/19 1750  NA 139 137  K 4.3 4.4  CL 104 103  CO2 27 25  GLUCOSE 119* 141*  BUN 32* 31*  CREATININE 1.35 1.32*  CALCIUM 10.0 9.5  GFRNONAA  --  51*  GFRAA  --  59*   CrCl cannot be calculated (Patient's most recent lab result is older than the maximum 21 days allowed.).  CMP Latest Ref Rng & Units 07/29/2019 03/21/2019 08/08/2018  Glucose 70 - 99 mg/dL 141(H) 119(H) 111(H)  BUN 8 - 23 mg/dL 31(H) 32(H) 20  Creatinine 0.61 - 1.24 mg/dL 1.32(H) 1.35 1.33  Sodium 135 - 145 mmol/L 137 139 139  Potassium 3.5 - 5.1 mmol/L 4.4 4.3 4.8  Chloride 98 - 111 mmol/L 103 104 105  CO2 22 - 32 mmol/L 25 27 26   Calcium 8.9 - 10.3 mg/dL 9.5 10.0 9.6  Total Protein 6.5 - 8.1 g/dL 7.5 7.4 7.1  Total Bilirubin 0.3 - 1.2 mg/dL 0.9 1.3(H) 1.1  Alkaline Phos 38 - 126 U/L 86 56 59  AST 15 - 41 U/L 31 24 23   ALT 0 - 44 U/L 24 18 24    CBC Latest Ref Rng & Units 07/29/2019 02/04/2018 08/09/2017  WBC 4.0 - 10.5 K/uL 7.9 6.8 5.5  Hemoglobin 13.0 - 17.0 g/dL 15.2 14.2 14.9  Hematocrit 39 - 52 % 45.4 43.4 43.5  Platelets 150 - 400 K/uL 216 214 194.0     Lipid Panel Recent Labs    03/21/19 0738  CHOL 156  TRIG 97.0  LDLCALC 73  VLDL 19.4  HDL 63.40  CHOLHDL 2    HEMOGLOBIN A1C Lab Results  Component Value Date   HGBA1C 5.7 03/21/2019   MPG 126 (H) 04/28/2010   TSH No results for input(s): TSH in the last 8760 hours.  Medications and allergies  No Known Allergies   Current Outpatient Medications  Medication Instructions  . atorvastatin (LIPITOR) 20 mg, Oral, Daily  . B Complex-C (SUPER B COMPLEX PO) 1 tablet, Oral, Daily  . Cholecalciferol (VITAMIN D3) 5000 units CAPS 1 capsule, Oral, Daily  . colchicine 0.6 mg, Oral, Daily  . esomeprazole (NEXIUM) 20 mg, Oral, As needed  . losartan (COZAAR) 25 mg, Oral, Every evening  . Magnesium 400 MG CAPS 1 tablet, Oral, Daily  . metoprolol succinate (TOPROL-XL) 50 mg, Oral, Daily  . Multiple Vitamin (MULTIVITAMIN) tablet 1 tablet, Daily  . Pancrelipase, Lip-Prot-Amyl, 24000-76000 units CPEP 24,000 Units, Oral, 3 times daily  . 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  MRI of the abdomen 05/01/2017:  Partially visualized saccular infrarenal abdominal aortic aneurysm measuring at least 4.5 cm.   Chest x-ray PA and lateral view 07/29/2019: The heart size and mediastinal contours are within normal limits. Both lungs are clear. The visualized skeletal structures are unremarkable.  Cardiac Studies:   Exercise Myoview stress test 02/03/2013: 1. Resting EKG showed normal sinus rhythm, LAD, LAFB, IRBBB. Stress EKG was negative for ischemia. Patient exercised on BRUCE PROTOCOL for 9 minutes 6 seconds. The maximum work level achieved was 9.7 MET's.  The test was terminated due to fatigue and achievement of the target heart rate. 2. Perfusion imaging study demonstrates normal perfusion without ischemia or scar. Normal left ventricular ejection fraction. Based on the stress results, continued primary prevention is recommended.  Exercise Treadmill Stress Test 03/07/2018:  Indication: Chest pain  The patient exercised on Bruce protocol for 7:29 min. Patient achieved 9.34 METS and reached HR 128 bpm, which is 89% of  maximum age-predicted HR. Stress test terminated due to fatigue.  Exercise capacity was normal for age. HR Response to Exercise: Appropriate. BP Response to Exercise: Resting hypertesion with normal exercise response.  Chest Pain: none. Arrhythmias: none. Resting EKG demonstrates Normal sinus rhythm with left anterior fascicular block. ST Changes: With peak exercise there was no ST-T changes of ischemia.  Overall Impression: Normal stress test. Recommendations: Continue primary/secondary prevention.   Echocardiogram 08/03/2019: Left ventricle cavity is normal in size. Moderate concentric hypertrophy of the left ventricle. Normal global wall motion. Normal LV systolic function with EF 59%.  Trileaflet aortic valve.  Mild (Grade I) aortic regurgitation. Mild aortic valve leaflet calcification. Mild calcification of the mitral valve annulus. Moderate (Grade II) mitral regurgitation. Moderate tricuspid regurgitation. Estimated pulmonary artery systolic pressure 22 mmHg, noted to be 43 mmHg on previous study on 03/17/2018. Compared to 02/23/2013, mitral regurgitation and tricuspid valve findings are new.    Abdominal Aortic Duplex 08/18/2019:  Moderate dilatation of the abdominal aorta is noted in the mid and distal  aorta. Mild plaque noted in the distal aorta. The maximum aorta (sac)  diameter is 4.53 cm (dist). Moderate dilatation of the abdominal aorta is  noted in the mid and distal aorta. Mild plaque observed in the  distal  aorta. Normal flow velocities noted.  Normal iliac artery velocity.  No significant change from 03/17/2018. See attached images.  EKG  EKG 08/14/2019: Normal sinus rhythm with rate of 69 bpm, left axis deviation, left anterior fascicular block.  Incomplete right bundle branch block.  Nonspecific T abnormality.  EKG 07/29/2019: Normal sinus rhythm at the rate of 75 bpm, left axis deviation, left anterior fascicular block.  PAC.  Normal QT interval.  Assessment     ICD-10-CM   1. Abdominal aortic aneurysm (AAA) >39 mm diameter (HCC)  I71.4   2. Primary hypertension  I10      There are no discontinued medications.   Recommendations:   Jazz Rogala  is a 79 y.o. Asian male with Patient with past medical history of hypertension, A. 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   He had atypical chest pain and was seen in the emergency room on 07/29/2019. He has not had any further recurrence of chest pain. His blood pressure was elevated on his last office visit, he brings blood pressure recordings from home and they're under excellent control and blood pressure is also well controlled here. Hence I did not make any changes to his medication. Risk factors are well controlled including control of his lipids. I discussed the findings of the AAA duplex, we'll continue annual surveillance for now. I'll see him back in a year.   Adrian Prows, MD, [facility] 09/28/2019, 1:24 PM Office: [phone]

## 2019-09-30 ENCOUNTER — Encounter: Payer: Self-pay | Admitting: *Deleted

## 2019-10-02 DIAGNOSIS — M533 Sacrococcygeal disorders, not elsewhere classified: Secondary | ICD-10-CM | POA: Diagnosis not present

## 2019-10-02 DIAGNOSIS — M25511 Pain in right shoulder: Secondary | ICD-10-CM | POA: Diagnosis not present

## 2019-10-19 DIAGNOSIS — Z23 Encounter for immunization: Secondary | ICD-10-CM | POA: Diagnosis not present

## 2019-11-10 ENCOUNTER — Telehealth: Payer: Self-pay | Admitting: *Deleted

## 2019-11-10 DIAGNOSIS — R7303 Prediabetes: Secondary | ICD-10-CM

## 2019-11-10 DIAGNOSIS — Z1159 Encounter for screening for other viral diseases: Secondary | ICD-10-CM

## 2019-11-10 DIAGNOSIS — E785 Hyperlipidemia, unspecified: Secondary | ICD-10-CM

## 2019-11-10 NOTE — Telephone Encounter (Signed)
Pt has lab appt on Monday but I do not see any orders in Epic. Please place orders if appropriate.

## 2019-11-13 ENCOUNTER — Other Ambulatory Visit: Payer: Self-pay

## 2019-11-13 ENCOUNTER — Other Ambulatory Visit (INDEPENDENT_AMBULATORY_CARE_PROVIDER_SITE_OTHER): Payer: Medicare Other

## 2019-11-13 DIAGNOSIS — Z1159 Encounter for screening for other viral diseases: Secondary | ICD-10-CM

## 2019-11-13 DIAGNOSIS — E785 Hyperlipidemia, unspecified: Secondary | ICD-10-CM | POA: Diagnosis not present

## 2019-11-13 DIAGNOSIS — R7303 Prediabetes: Secondary | ICD-10-CM

## 2019-11-14 LAB — COMPREHENSIVE METABOLIC PANEL
AG Ratio: 1.8 (calc) (ref 1.0–2.5)
ALT: 18 U/L (ref 9–46)
AST: 23 U/L (ref 10–35)
Albumin: 4.8 g/dL (ref 3.6–5.1)
Alkaline phosphatase (APISO): 58 U/L (ref 35–144)
BUN/Creatinine Ratio: 25 (calc) — ABNORMAL HIGH (ref 6–22)
BUN: 34 mg/dL — ABNORMAL HIGH (ref 7–25)
CO2: 22 mmol/L (ref 20–32)
Calcium: 10 mg/dL (ref 8.6–10.3)
Chloride: 104 mmol/L (ref 98–110)
Creat: 1.37 mg/dL — ABNORMAL HIGH (ref 0.70–1.18)
Globulin: 2.7 g/dL (calc) (ref 1.9–3.7)
Glucose, Bld: 108 mg/dL — ABNORMAL HIGH (ref 65–99)
Potassium: 4.9 mmol/L (ref 3.5–5.3)
Sodium: 139 mmol/L (ref 135–146)
Total Bilirubin: 1.2 mg/dL (ref 0.2–1.2)
Total Protein: 7.5 g/dL (ref 6.1–8.1)

## 2019-11-14 LAB — LIPID PANEL
Cholesterol: 162 mg/dL (ref <200)
HDL: 71 mg/dL (ref >=40)
LDL Cholesterol (Calc): 71 mg/dL (calc)
Non-HDL Cholesterol (Calc): 91 mg/dL (calc) (ref <130)
Total CHOL/HDL Ratio: 2.3 (calc) (ref <5.0)
Triglycerides: 114 mg/dL (ref <150)

## 2019-11-14 LAB — HEPATITIS C ANTIBODY
Hepatitis C Ab: NONREACTIVE
SIGNAL TO CUT-OFF: 0 (ref <1.00)

## 2019-11-14 LAB — HEMOGLOBIN A1C
Hgb A1c MFr Bld: 5.9 % of total Hgb — ABNORMAL HIGH (ref <5.7)
Mean Plasma Glucose: 123 (calc)
eAG (mmol/L): 6.8 (calc)

## 2019-11-20 ENCOUNTER — Other Ambulatory Visit: Payer: Self-pay

## 2019-11-20 ENCOUNTER — Ambulatory Visit (INDEPENDENT_AMBULATORY_CARE_PROVIDER_SITE_OTHER): Payer: Medicare Other | Admitting: Family Medicine

## 2019-11-20 ENCOUNTER — Encounter: Payer: Self-pay | Admitting: Family Medicine

## 2019-11-20 VITALS — BP 108/68 | HR 78 | Temp 97.7°F | Ht 67.0 in | Wt 179.0 lb

## 2019-11-20 DIAGNOSIS — I10 Essential (primary) hypertension: Secondary | ICD-10-CM

## 2019-11-20 DIAGNOSIS — E785 Hyperlipidemia, unspecified: Secondary | ICD-10-CM

## 2019-11-20 DIAGNOSIS — R7303 Prediabetes: Secondary | ICD-10-CM | POA: Diagnosis not present

## 2019-11-20 NOTE — Patient Instructions (Addendum)
Lab work looks normal/stable. Keep up the good work.   EXERCISES  RANGE OF MOTION (ROM) AND STRETCHING EXERCISES - Low Back Pain Most people with lower back pain will find that their symptoms get worse with excessive bending forward (flexion) or arching at the lower back (extension). The exercises that will help resolve your symptoms will focus on the opposite motion.  If you have pain, numbness or tingling which travels down into your buttocks, leg or foot, the goal of the therapy is for these symptoms to move closer to your back and eventually resolve. Sometimes, these leg symptoms will get better, but your lower back pain may worsen. This is often an indication of progress in your rehabilitation. Be very alert to any changes in your symptoms and the activities in which you participated in the 24 hours prior to the change. Sharing this information with your caregiver will allow him or her to most efficiently treat your condition. These exercises may help you when beginning to rehabilitate your injury. Your symptoms may resolve with or without further involvement from your physician, physical therapist or athletic trainer. While completing these exercises, remember:   Restoring tissue flexibility helps normal motion to return to the joints. This allows healthier, less painful movement and activity.  An effective stretch should be held for at least 30 seconds.  A stretch should never be painful. You should only feel a gentle lengthening or release in the stretched tissue. FLEXION RANGE OF MOTION AND STRETCHING EXERCISES:  STRETCH - Flexion, Single Knee to Chest   Lie on a firm bed or floor with both legs extended in front of you.  Keeping one leg in contact with the floor, bring your opposite knee to your chest. Hold your leg in place by either grabbing behind your thigh or at your knee.  Pull until you feel a gentle stretch in your low back. Hold 30 seconds.  Slowly release your grasp and  repeat the exercise with the opposite side. Repeat 2 times. Complete this exercise 3 times per week.   STRETCH - Flexion, Double Knee to Chest  Lie on a firm bed or floor with both legs extended in front of you.  Keeping one leg in contact with the floor, bring your opposite knee to your chest.  Tense your stomach muscles to support your back and then lift your other knee to your chest. Hold your legs in place by either grabbing behind your thighs or at your knees.  Pull both knees toward your chest until you feel a gentle stretch in your low back. Hold 30 seconds.  Tense your stomach muscles and slowly return one leg at a time to the floor. Repeat 2 times. Complete this exercise 3 times per week.   STRETCH - Low Trunk Rotation  Lie on a firm bed or floor. Keeping your legs in front of you, bend your knees so they are both pointed toward the ceiling and your feet are flat on the floor.  Extend your arms out to the side. This will stabilize your upper body by keeping your shoulders in contact with the floor.  Gently and slowly drop both knees together to one side until you feel a gentle stretch in your low back. Hold for 30 seconds.  Tense your stomach muscles to support your lower back as you bring your knees back to the starting position. Repeat the exercise to the other side. Repeat 2 times. Complete this exercise at least 3 times per week.  EXTENSION RANGE OF MOTION AND FLEXIBILITY EXERCISES:  STRETCH - Extension, Prone on Elbows   Lie on your stomach on the floor, a bed will be too soft. Place your palms about shoulder width apart and at the height of your head.  Place your elbows under your shoulders. If this is too painful, stack pillows under your chest.  Allow your body to relax so that your hips drop lower and make contact more completely with the floor.  Hold this position for 30 seconds.  Slowly return to lying flat on the floor. Repeat 2 times. Complete this  exercise 3 times per week.   RANGE OF MOTION - Extension, Prone Press Ups  Lie on your stomach on the floor, a bed will be too soft. Place your palms about shoulder width apart and at the height of your head.  Keeping your back as relaxed as possible, slowly straighten your elbows while keeping your hips on the floor. You may adjust the placement of your hands to maximize your comfort. As you gain motion, your hands will come more underneath your shoulders.  Hold this position 30 seconds.  Slowly return to lying flat on the floor. Repeat 2 times. Complete this exercise 3 times per week.   RANGE OF MOTION- Quadruped, Neutral Spine   Assume a hands and knees position on a firm surface. Keep your hands under your shoulders and your knees under your hips. You may place padding under your knees for comfort.  Drop your head and point your tailbone toward the ground below you. This will round out your lower back like an angry cat. Hold this position for 30 seconds.  Slowly lift your head and release your tail bone so that your back sags into a large arch, like an old horse.  Hold this position for 30 seconds.  Repeat this until you feel limber in your low back.  Now, find your "sweet spot." This will be the most comfortable position somewhere between the two previous positions. This is your neutral spine. Once you have found this position, tense your stomach muscles to support your low back.  Hold this position for 30 seconds. Repeat 2 times. Complete this exercise 3 times per week.   STRENGTHENING EXERCISES - Low Back Sprain These exercises may help you when beginning to rehabilitate your injury. These exercises should be done near your "sweet spot." This is the neutral, low-back arch, somewhere between fully rounded and fully arched, that is your least painful position. When performed in this safe range of motion, these exercises can be used for people who have either a flexion or extension  based injury. These exercises may resolve your symptoms with or without further involvement from your physician, physical therapist or athletic trainer. While completing these exercises, remember:   Muscles can gain both the endurance and the strength needed for everyday activities through controlled exercises.  Complete these exercises as instructed by your physician, physical therapist or athletic trainer. Increase the resistance and repetitions only as guided.  You may experience muscle soreness or fatigue, but the pain or discomfort you are trying to eliminate should never worsen during these exercises. If this pain does worsen, stop and make certain you are following the directions exactly. If the pain is still present after adjustments, discontinue the exercise until you can discuss the trouble with your caregiver.  STRENGTHENING - Deep Abdominals, Pelvic Tilt   Lie on a firm bed or floor. Keeping your legs in front of you, bend  your knees so they are both pointed toward the ceiling and your feet are flat on the floor.  Tense your lower abdominal muscles to press your low back into the floor. This motion will rotate your pelvis so that your tail bone is scooping upwards rather than pointing at your feet or into the floor. With a gentle tension and even breathing, hold this position for 3 seconds. Repeat 2 times. Complete this exercise 3 times per week.   STRENGTHENING - Abdominals, Crunches   Lie on a firm bed or floor. Keeping your legs in front of you, bend your knees so they are both pointed toward the ceiling and your feet are flat on the floor. Cross your arms over your chest.  Slightly tip your chin down without bending your neck.  Tense your abdominals and slowly lift your trunk high enough to just clear your shoulder blades. Lifting higher can put excessive stress on the lower back and does not further strengthen your abdominal muscles.  Control your return to the starting  position. Repeat 2 times. Complete this exercise 3 times per week.   STRENGTHENING - Quadruped, Opposite UE/LE Lift   Assume a hands and knees position on a firm surface. Keep your hands under your shoulders and your knees under your hips. You may place padding under your knees for comfort.  Find your neutral spine and gently tense your abdominal muscles so that you can maintain this position. Your shoulders and hips should form a rectangle that is parallel with the floor and is not twisted.  Keeping your trunk steady, lift your right hand no higher than your shoulder and then your left leg no higher than your hip. Make sure you are not holding your breath. Hold this position for 30 seconds.  Continuing to keep your abdominal muscles tense and your back steady, slowly return to your starting position. Repeat with the opposite arm and leg. Repeat 2 times. Complete this exercise 3 times per week.   STRENGTHENING - Abdominals and Quadriceps, Straight Leg Raise   Lie on a firm bed or floor with both legs extended in front of you.  Keeping one leg in contact with the floor, bend the other knee so that your foot can rest flat on the floor.  Find your neutral spine, and tense your abdominal muscles to maintain your spinal position throughout the exercise.  Slowly lift your straight leg off the floor about 6 inches for a count of 3, making sure to not hold your breath.  Still keeping your neutral spine, slowly lower your leg all the way to the floor. Repeat this exercise with each leg 2 times. Complete this exercise 3 times per week.  POSTURE AND BODY MECHANICS CONSIDERATIONS - Low Back Sprain Keeping correct posture when sitting, standing or completing your activities will reduce the stress put on different body tissues, allowing injured tissues a chance to heal and limiting painful experiences. The following are general guidelines for improved posture.  While reading these guidelines,  remember:  The exercises prescribed by your provider will help you have the flexibility and strength to maintain correct postures.  The correct posture provides the best environment for your joints to work. All of your joints have less wear and tear when properly supported by a spine with good posture. This means you will experience a healthier, less painful body.  Correct posture must be practiced with all of your activities, especially prolonged sitting and standing. Correct posture is as important when  doing repetitive low-stress activities (typing) as it is when doing a single heavy-load activity (lifting).  RESTING POSITIONS Consider which positions are most painful for you when choosing a resting position. If you have pain with flexion-based activities (sitting, bending, stooping, squatting), choose a position that allows you to rest in a less flexed posture. You would want to avoid curling into a fetal position on your side. If your pain worsens with extension-based activities (prolonged standing, working overhead), avoid resting in an extended position such as sleeping on your stomach. Most people will find more comfort when they rest with their spine in a more neutral position, neither too rounded nor too arched. Lying on a non-sagging bed on your side with a pillow between your knees, or on your back with a pillow under your knees will often provide some relief. Keep in mind, being in any one position for a prolonged period of time, no matter how correct your posture, can still lead to stiffness.  PROPER SITTING POSTURE In order to minimize stress and discomfort on your spine, you must sit with correct posture. Sitting with good posture should be effortless for a healthy body. Returning to good posture is a gradual process. Many people can work toward this most comfortably by using various supports until they have the flexibility and strength to maintain this posture on their own. When sitting  with proper posture, your ears will fall over your shoulders and your shoulders will fall over your hips. You should use the back of the chair to support your upper back. Your lower back will be in a neutral position, just slightly arched. You may place a small pillow or folded towel at the base of your lower back for  support.  When working at a desk, create an environment that supports good, upright posture. Without extra support, muscles tire, which leads to excessive strain on joints and other tissues. Keep these recommendations in mind:  CHAIR:  A chair should be able to slide under your desk when your back makes contact with the back of the chair. This allows you to work closely.  The chair's height should allow your eyes to be level with the upper part of your monitor and your hands to be slightly lower than your elbows.  BODY POSITION  Your feet should make contact with the floor. If this is not possible, use a foot rest.  Keep your ears over your shoulders. This will reduce stress on your neck and low back.  INCORRECT SITTING POSTURES  If you are feeling tired and unable to assume a healthy sitting posture, do not slouch or slump. This puts excessive strain on your back tissues, causing more damage and pain. Healthier options include:  Using more support, like a lumbar pillow.  Switching tasks to something that requires you to be upright or walking.  Talking a brief walk.  Lying down to rest in a neutral-spine position.  PROLONGED STANDING WHILE SLIGHTLY LEANING FORWARD  When completing a task that requires you to lean forward while standing in one place for a long time, place either foot up on a stationary 2-4 inch high object to help maintain the best posture. When both feet are on the ground, the lower back tends to lose its slight inward curve. If this curve flattens (or becomes too large), then the back and your other joints will experience too much stress, tire more  quickly, and can cause pain.  CORRECT STANDING POSTURES Proper standing posture should be  assumed with all daily activities, even if they only take a few moments, like when brushing your teeth. As in sitting, your ears should fall over your shoulders and your shoulders should fall over your hips. You should keep a slight tension in your abdominal muscles to brace your spine. Your tailbone should point down to the ground, not behind your body, resulting in an over-extended swayback posture.   INCORRECT STANDING POSTURES  Common incorrect standing postures include a forward head, locked knees and/or an excessive swayback. WALKING Walk with an upright posture. Your ears, shoulders and hips should all line-up.  PROLONGED ACTIVITY IN A FLEXED POSITION When completing a task that requires you to bend forward at your waist or lean over a low surface, try to find a way to stabilize 3 out of 4 of your limbs. You can place a hand or elbow on your thigh or rest a knee on the surface you are reaching across. This will provide you more stability, so that your muscles do not tire as quickly. By keeping your knees relaxed, or slightly bent, you will also reduce stress across your lower back. CORRECT LIFTING TECHNIQUES  DO :  Assume a wide stance. This will provide you more stability and the opportunity to get as close as possible to the object which you are lifting.  Tense your abdominals to brace your spine. Bend at the knees and hips. Keeping your back locked in a neutral-spine position, lift using your leg muscles. Lift with your legs, keeping your back straight.  Test the weight of unknown objects before attempting to lift them.  Try to keep your elbows locked down at your sides in order get the best strength from your shoulders when carrying an object.     Always ask for help when lifting heavy or awkward objects. INCORRECT LIFTING TECHNIQUES DO NOT:   Lock your knees when lifting, even if it is  a small object.  Bend and twist. Pivot at your feet or move your feet when needing to change directions.  Assume that you can safely pick up even a paperclip without proper posture.

## 2019-11-20 NOTE — Progress Notes (Signed)
Chief Complaint  Patient presents with  . Follow-up    Subjective Brandon Bradford is a 79 y.o. male who presents for hypertension follow up. He does not monitor home blood pressures. He is compliant with medication- Toprol XL 50 mg/d. Patient has these side effects of medication: none He is adhering to a healthy diet overall. Current exercise: golfing, walking  Hyperlipidemia Patient presents for dyslipidemia follow up. Currently being treated with Lipitor  and compliance with treatment thus far has been good. He denies myalgias. Diet/exercise as above.  The patient is not known to have coexisting coronary artery disease.   Past Medical History:  Diagnosis Date  . AAA (abdominal aortic aneurysm) (HCC)    3.4 cm  . Cataract 06/2016   bilateral cataract extraction  . Colitis, ischemic ([place])   . Gastritis   . GERD (gastroesophageal reflux disease)   . Hx of colonic polyps   . Hyperlipidemia   . Hypertension   . Paroxysmal atrial fibrillation (HCC)   . Vertigo, intermittent     Exam BP 108/68 (BP Location: Left Arm, Patient Position: Sitting, Cuff Size: Normal)   Pulse 78   Temp 97.7 F (36.5 C) (Oral)   Ht 5\' 7"  (1.702 m)   Wt 179 lb (81.2 kg)   SpO2 97%   BMI 28.04 kg/m  General:  well developed, well nourished, in no apparent distress Heart: RRR, no bruits, no LE edema Lungs: clear to auscultation, no accessory muscle use Psych: well oriented with normal range of affect and appropriate judgment/insight  Essential hypertension  Hyperlipidemia, unspecified hyperlipidemia type - Plan: Lipid panel, Comprehensive metabolic panel  Prediabetes - Plan: Hemoglobin A1c  1. Cont Toprol XL 50 mg/d. Does not need to monitor BP at home. Counseled on diet/exercise. 2. Cont Lipitor 20 mg/d. F/u in in 6 mo or prn. Labs 1 week prior.  The patient voiced understanding and agreement to the plan.  [place], DO 11/20/19  2:37 PM

## 2019-12-05 ENCOUNTER — Other Ambulatory Visit: Payer: Self-pay | Admitting: Family Medicine

## 2019-12-05 DIAGNOSIS — I10 Essential (primary) hypertension: Secondary | ICD-10-CM

## 2019-12-05 DIAGNOSIS — K929 Disease of digestive system, unspecified: Secondary | ICD-10-CM

## 2019-12-05 MED ORDER — PANCRELIPASE (LIP-PROT-AMYL) 24000-76000 UNITS PO CPEP: 1.0000 | ORAL_CAPSULE | Freq: Three times a day (TID) | ORAL | 3 refills | Status: AC

## 2019-12-05 MED ORDER — METOPROLOL SUCCINATE ER 50 MG PO TB24: 50.0000 mg | ORAL_TABLET | Freq: Every day | ORAL | 3 refills | Status: DC

## 2019-12-05 MED ORDER — LOSARTAN POTASSIUM 25 MG PO TABS: 25.0000 mg | ORAL_TABLET | Freq: Every evening | ORAL | 3 refills | Status: DC

## 2019-12-05 MED ORDER — ATORVASTATIN CALCIUM 20 MG PO TABS: 20.0000 mg | ORAL_TABLET | Freq: Every day | ORAL | 3 refills | Status: DC

## 2020-02-18 ENCOUNTER — Other Ambulatory Visit: Payer: Self-pay | Admitting: Family Medicine

## 2020-02-18 DIAGNOSIS — I10 Essential (primary) hypertension: Secondary | ICD-10-CM

## 2020-02-29 ENCOUNTER — Telehealth: Payer: Self-pay | Admitting: Family Medicine

## 2020-02-29 ENCOUNTER — Other Ambulatory Visit: Payer: Self-pay

## 2020-02-29 DIAGNOSIS — I10 Essential (primary) hypertension: Secondary | ICD-10-CM

## 2020-02-29 MED ORDER — LOSARTAN POTASSIUM 25 MG PO TABS: 25.0000 mg | ORAL_TABLET | Freq: Every day | ORAL | 1 refills | Status: DC

## 2020-02-29 NOTE — Telephone Encounter (Signed)
Pt's wife came in office stating is needing refill on losartan send to [facility] 90 day supply for pt . Pt was LVM  on phone that rx was requested on 02-19-2020 to wait and see if rx is received soon.

## 2020-02-29 NOTE — Telephone Encounter (Signed)
Rx sent again

## 2020-03-25 DIAGNOSIS — Z9049 Acquired absence of other specified parts of digestive tract: Secondary | ICD-10-CM | POA: Diagnosis not present

## 2020-03-25 DIAGNOSIS — I129 Hypertensive chronic kidney disease with stage 1 through stage 4 chronic kidney disease, or unspecified chronic kidney disease: Secondary | ICD-10-CM | POA: Diagnosis not present

## 2020-03-25 DIAGNOSIS — R739 Hyperglycemia, unspecified: Secondary | ICD-10-CM | POA: Diagnosis not present

## 2020-03-25 DIAGNOSIS — K2970 Gastritis, unspecified, without bleeding: Secondary | ICD-10-CM | POA: Diagnosis not present

## 2020-03-25 DIAGNOSIS — R109 Unspecified abdominal pain: Secondary | ICD-10-CM | POA: Diagnosis not present

## 2020-03-25 DIAGNOSIS — I714 Abdominal aortic aneurysm, without rupture: Secondary | ICD-10-CM | POA: Diagnosis not present

## 2020-03-25 DIAGNOSIS — N183 Chronic kidney disease, stage 3 unspecified: Secondary | ICD-10-CM | POA: Diagnosis not present

## 2020-03-25 DIAGNOSIS — R351 Nocturia: Secondary | ICD-10-CM | POA: Diagnosis not present

## 2020-03-25 DIAGNOSIS — M109 Gout, unspecified: Secondary | ICD-10-CM | POA: Diagnosis not present

## 2020-05-13 ENCOUNTER — Other Ambulatory Visit (INDEPENDENT_AMBULATORY_CARE_PROVIDER_SITE_OTHER): Payer: Medicare Other

## 2020-05-13 ENCOUNTER — Other Ambulatory Visit: Payer: Self-pay

## 2020-05-13 DIAGNOSIS — E785 Hyperlipidemia, unspecified: Secondary | ICD-10-CM

## 2020-05-13 DIAGNOSIS — R7303 Prediabetes: Secondary | ICD-10-CM | POA: Diagnosis not present

## 2020-05-14 LAB — COMPREHENSIVE METABOLIC PANEL
AG Ratio: 1.9 (calc) (ref 1.0–2.5)
ALT: 20 U/L (ref 9–46)
AST: 33 U/L (ref 10–35)
Albumin: 4.8 g/dL (ref 3.6–5.1)
Alkaline phosphatase (APISO): 55 U/L (ref 35–144)
BUN/Creatinine Ratio: 20 (calc) (ref 6–22)
BUN: 28 mg/dL — ABNORMAL HIGH (ref 7–25)
CO2: 26 mmol/L (ref 20–32)
Calcium: 9.8 mg/dL (ref 8.6–10.3)
Chloride: 106 mmol/L (ref 98–110)
Creat: 1.37 mg/dL — ABNORMAL HIGH (ref 0.70–1.18)
Globulin: 2.5 g/dL (calc) (ref 1.9–3.7)
Glucose, Bld: 111 mg/dL — ABNORMAL HIGH (ref 65–99)
Potassium: 4.8 mmol/L (ref 3.5–5.3)
Sodium: 140 mmol/L (ref 135–146)
Total Bilirubin: 1.4 mg/dL — ABNORMAL HIGH (ref 0.2–1.2)
Total Protein: 7.3 g/dL (ref 6.1–8.1)

## 2020-05-14 LAB — LIPID PANEL
Cholesterol: 160 mg/dL (ref <200)
HDL: 71 mg/dL (ref >=40)
LDL Cholesterol (Calc): 72 mg/dL (calc)
Non-HDL Cholesterol (Calc): 89 mg/dL (calc) (ref <130)
Total CHOL/HDL Ratio: 2.3 (calc) (ref <5.0)
Triglycerides: 85 mg/dL (ref <150)

## 2020-05-14 LAB — HEMOGLOBIN A1C
Hgb A1c MFr Bld: 5.9 % of total Hgb — ABNORMAL HIGH (ref <5.7)
Mean Plasma Glucose: 123 mg/dL
eAG (mmol/L): 6.8 mmol/L

## 2020-05-20 ENCOUNTER — Ambulatory Visit: Payer: Medicare Other | Admitting: Family Medicine

## 2020-06-04 ENCOUNTER — Encounter: Payer: Self-pay | Admitting: Family Medicine

## 2020-06-04 ENCOUNTER — Ambulatory Visit (INDEPENDENT_AMBULATORY_CARE_PROVIDER_SITE_OTHER): Payer: Medicare Other | Admitting: Family Medicine

## 2020-06-04 ENCOUNTER — Other Ambulatory Visit: Payer: Self-pay

## 2020-06-04 ENCOUNTER — Ambulatory Visit (HOSPITAL_BASED_OUTPATIENT_CLINIC_OR_DEPARTMENT_OTHER)
Admission: RE | Admit: 2020-06-04 | Discharge: 2020-06-04 | Disposition: A | Discharge status: Home / Self Care | Payer: Medicare Other | Source: Ambulatory Visit | Attending: Family Medicine | Admitting: Family Medicine

## 2020-06-04 VITALS — BP 122/70 | HR 73 | Temp 98.3°F | Resp 16 | Ht 67.0 in | Wt 177.5 lb

## 2020-06-04 DIAGNOSIS — I714 Abdominal aortic aneurysm, without rupture, unspecified: Secondary | ICD-10-CM

## 2020-06-04 DIAGNOSIS — R042 Hemoptysis: Secondary | ICD-10-CM | POA: Diagnosis not present

## 2020-06-04 DIAGNOSIS — E785 Hyperlipidemia, unspecified: Secondary | ICD-10-CM | POA: Diagnosis not present

## 2020-06-04 DIAGNOSIS — R7303 Prediabetes: Secondary | ICD-10-CM

## 2020-06-04 DIAGNOSIS — R911 Solitary pulmonary nodule: Secondary | ICD-10-CM | POA: Diagnosis not present

## 2020-06-04 DIAGNOSIS — I700 Atherosclerosis of aorta: Secondary | ICD-10-CM | POA: Diagnosis not present

## 2020-06-04 DIAGNOSIS — I10 Essential (primary) hypertension: Secondary | ICD-10-CM | POA: Diagnosis not present

## 2020-06-04 DIAGNOSIS — J984 Other disorders of lung: Secondary | ICD-10-CM | POA: Diagnosis not present

## 2020-06-04 NOTE — Progress Notes (Signed)
Chief Complaint  Patient presents with  . Follow-up    Subjective Brandon Bradford is a 80 y.o. male who presents for hypertension follow up.  Here with spouse. He does not monitor home blood pressures. He is compliant with medications- Toprol XL 50 mg/d, losartan 25 mg/d. Patient has these side effects of medication: none He is adhering to a healthy diet overall. Current exercise: golfing No CP or SOB.   Hyperlipidemia Patient presents for dyslipidemia follow up. Currently being treated with Lipitor 20 mg/d and compliance with treatment thus far has been good. He denies myalgias. Diet/exercise as above.  The patient is not known to have coexisting coronary artery disease.  Pt had a cough 2 weeks ago and coughed up blood. No issues since then. No fevers or other uri s/s's. Requesting CXR.    Past Medical History:  Diagnosis Date  . AAA (abdominal aortic aneurysm) (HCC)    3.4 cm  . Cataract 06/2016   bilateral cataract extraction  . Colitis, ischemic ([place])   . Gastritis   . GERD (gastroesophageal reflux disease)   . Hx of colonic polyps   . Hyperlipidemia   . Hypertension   . Paroxysmal atrial fibrillation (HCC)   . Vertigo, intermittent     Exam BP 122/70 (BP Location: Left Arm, Patient Position: Sitting, Cuff Size: Small)   Pulse 73   Temp 98.3 F (36.8 C) (Oral)   Resp 16   Ht 5\' 7"  (1.702 m)   Wt 177 lb 8 oz (80.5 kg)   SpO2 97%   BMI 27.80 kg/m  General:  well developed, well nourished, in no apparent distress Heart: RRR, no bruits, no LE edema Lungs: clear to auscultation, no accessory muscle use Psych: well oriented with normal range of affect and appropriate judgment/insight  Essential hypertension  Hyperlipidemia, unspecified hyperlipidemia type - Plan: Lipid panel, Comprehensive metabolic panel  Abdominal aortic aneurysm (AAA) >39 mm diameter (HCC), Chronic  Hemoptysis - Plan: DG Chest 2 View  Prediabetes - Plan: Hemoglobin A1c  1.  Chronic, stable.   Continue losartan 25 mg daily, Toprol-XL 50 mg daily.  Counseled on diet and exercise. 2.  Chronic, stable.  Continue Lipitor 20 mg daily. Or coughing up blood, likely related to infection.  Will check x-ray for reassurance. F/u in 6 months or as needed. The patient and his spouse voiced understanding and agreement to the plan.  [place], DO 06/04/20  1:46 PM

## 2020-06-04 NOTE — Patient Instructions (Signed)
Keep the diet clean and stay active.  We will set up for labs next time as well.   Let us know if you need anything.

## 2020-06-06 NOTE — Addendum Note (Signed)
Addended byDamita Dunnings D on: 06/06/2020 01:31 PM   Modules accepted: Orders

## 2020-06-18 ENCOUNTER — Ambulatory Visit (HOSPITAL_BASED_OUTPATIENT_CLINIC_OR_DEPARTMENT_OTHER)
Admission: RE | Admit: 2020-06-18 | Discharge: 2020-06-18 | Disposition: A | Discharge status: Home / Self Care | Payer: Medicare Other | Source: Ambulatory Visit | Attending: Family Medicine | Admitting: Family Medicine

## 2020-06-18 ENCOUNTER — Other Ambulatory Visit: Payer: Self-pay

## 2020-06-18 DIAGNOSIS — R042 Hemoptysis: Secondary | ICD-10-CM | POA: Diagnosis not present

## 2020-06-18 DIAGNOSIS — J9811 Atelectasis: Secondary | ICD-10-CM | POA: Diagnosis not present

## 2020-06-18 DIAGNOSIS — R918 Other nonspecific abnormal finding of lung field: Secondary | ICD-10-CM | POA: Diagnosis not present

## 2020-06-25 DIAGNOSIS — Z23 Encounter for immunization: Secondary | ICD-10-CM | POA: Diagnosis not present

## 2020-07-11 ENCOUNTER — Ambulatory Visit: Payer: Medicare Other | Admitting: Cardiology

## 2020-07-16 ENCOUNTER — Other Ambulatory Visit: Payer: Self-pay

## 2020-08-19 ENCOUNTER — Other Ambulatory Visit: Payer: Self-pay | Admitting: Family Medicine

## 2020-08-19 DIAGNOSIS — I10 Essential (primary) hypertension: Secondary | ICD-10-CM

## 2020-09-16 ENCOUNTER — Telehealth: Payer: Self-pay | Admitting: Cardiology

## 2020-09-17 ENCOUNTER — Other Ambulatory Visit: Payer: Medicare Other

## 2020-09-17 ENCOUNTER — Ambulatory Visit: Payer: Medicare Other

## 2020-09-17 ENCOUNTER — Other Ambulatory Visit: Payer: Self-pay

## 2020-09-17 DIAGNOSIS — I10 Essential (primary) hypertension: Secondary | ICD-10-CM

## 2020-09-17 DIAGNOSIS — I714 Abdominal aortic aneurysm, without rupture, unspecified: Secondary | ICD-10-CM

## 2020-09-17 DIAGNOSIS — I6523 Occlusion and stenosis of bilateral carotid arteries: Secondary | ICD-10-CM | POA: Diagnosis not present

## 2020-09-17 DIAGNOSIS — R9431 Abnormal electrocardiogram [ECG] [EKG]: Secondary | ICD-10-CM

## 2020-09-19 ENCOUNTER — Other Ambulatory Visit: Payer: Self-pay

## 2020-09-19 ENCOUNTER — Ambulatory Visit: Payer: Medicare Other

## 2020-09-19 DIAGNOSIS — R9431 Abnormal electrocardiogram [ECG] [EKG]: Secondary | ICD-10-CM

## 2020-09-19 DIAGNOSIS — I10 Essential (primary) hypertension: Secondary | ICD-10-CM

## 2020-09-27 ENCOUNTER — Other Ambulatory Visit: Payer: Self-pay

## 2020-09-27 ENCOUNTER — Encounter: Payer: Self-pay | Admitting: Cardiology

## 2020-09-27 ENCOUNTER — Ambulatory Visit: Payer: Medicare Other | Admitting: Cardiology

## 2020-09-27 VITALS — BP 113/67 | HR 73 | Temp 98.0°F | Resp 17 | Ht 67.0 in | Wt 183.0 lb

## 2020-09-27 DIAGNOSIS — N1831 Chronic kidney disease, stage 3a: Secondary | ICD-10-CM

## 2020-09-27 DIAGNOSIS — I10 Essential (primary) hypertension: Secondary | ICD-10-CM | POA: Diagnosis not present

## 2020-09-27 DIAGNOSIS — I714 Abdominal aortic aneurysm, without rupture, unspecified: Secondary | ICD-10-CM

## 2020-09-27 DIAGNOSIS — E7800 Pure hypercholesterolemia, unspecified: Secondary | ICD-10-CM

## 2020-09-27 NOTE — Progress Notes (Signed)
Primary Physician/Referring:  Shelda Pal, DO  Patient ID: Brandon Bradford, male    DOB: September 26, 1940, 80 y.o.   MRN: [id number]  No chief complaint on file.  HPI:    Brandon Bradford  is a 80 y.o. Asian male with Patient with past medical history of hypertension, A. 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   He is here on annual visit and follow-up of hypertension and AAA.  Remains asymptomatic.  Accompanied by his wife.  Past Medical History:  Diagnosis Date   AAA (abdominal aortic aneurysm) (HCC)    3.4 cm   Cataract 06/2016   bilateral cataract extraction   Colitis, ischemic (HCC)    Gastritis    GERD (gastroesophageal reflux disease)    Hx of colonic polyps    Hyperlipidemia    Hypertension    Paroxysmal atrial fibrillation (HCC)    Vertigo, intermittent    Past Surgical History:  Procedure Laterality Date   APPENDECTOMY  1971   CATARACT EXTRACTION, BILATERAL     HEMICOLECTOMY     Right   HEMORROIDECTOMY  2002   Family History  Problem Relation Age of Onset   Colon cancer Neg Hx     Social History   Tobacco Use   Smoking status: Former    Types: Cigarettes    Quit date: 10/13/1995    Years since quitting: 24.9   Smokeless tobacco: Never  Substance Use Topics   Alcohol use: No    Alcohol/week: 0.0 standard drinks   Marital Status: Married  ROS  Review of Systems  Cardiovascular:  Negative for chest pain, dyspnea on exertion and leg swelling.  Gastrointestinal:  Negative for melena.  Objective  There were no vitals taken for this visit.  Vitals with BMI 06/04/2020 11/20/2019 09/28/2019  Height 5' 7"  5' 7"  5' 7"   Weight 177 lbs 8 oz 179 lbs 179 lbs 13 oz  BMI 27.79 09.32 67.12   Systolic 458 099 833  Diastolic 70 68 78  Pulse 73 78 71     Physical Exam Cardiovascular:     Rate and Rhythm: Normal rate and regular rhythm.     Pulses: Intact distal pulses.     Heart sounds: Normal heart sounds. No murmur heard.   No gallop.     Comments: No leg edema, no JVD. Pulmonary:     Effort: Pulmonary effort is normal.     Breath sounds: Normal breath sounds.  Abdominal:     General: Bowel sounds are normal.     Palpations: Abdomen is soft.   Laboratory examination:   Recent Labs    11/13/19 0716 05/13/20 0718  NA 139 140  K 4.9 4.8  CL 104 106  CO2 22 26  GLUCOSE 108* 111*  BUN 34* 28*  CREATININE 1.37* 1.37*  CALCIUM 10.0 9.8   CrCl cannot be calculated (Patient's most recent lab result is older than the maximum 21 days allowed.).  CMP Latest Ref Rng & Units 05/13/2020 11/13/2019 07/29/2019  Glucose 65 - 99 mg/dL 111(H) 108(H) 141(H)  BUN 7 - 25 mg/dL 28(H) 34(H) 31(H)  Creatinine 0.70 - 1.18 mg/dL 1.37(H) 1.37(H) 1.32(H)  Sodium 135 - 146 mmol/L 140 139 137  Potassium 3.5 - 5.3 mmol/L 4.8 4.9 4.4  Chloride 98 - 110 mmol/L 106 104 103  CO2 20 - 32 mmol/L 26 22 25   Calcium 8.6 - 10.3 mg/dL 9.8 10.0 9.5  Total Protein 6.1 - 8.1 g/dL 7.3 7.5 7.5  Total Bilirubin 0.2 - 1.2 mg/dL 1.4(H) 1.2 0.9  Alkaline Phos 38 - 126 U/L - - 86  AST 10 - 35 U/L 33 23 31  ALT 9 - 46 U/L 20 18 24    CBC Latest Ref Rng & Units 07/29/2019 02/04/2018 08/09/2017  WBC 4.0 - 10.5 K/uL 7.9 6.8 5.5  Hemoglobin 13.0 - 17.0 g/dL 15.2 14.2 14.9  Hematocrit 39.0 - 52.0 % 45.4 43.4 43.5  Platelets 150 - 400 K/uL 216 214 194.0    Lipid Panel Recent Labs    11/13/19 0716 05/13/20 0718  CHOL 162 160  TRIG 114 85  LDLCALC 71 72  HDL 71 71  CHOLHDL 2.3 2.3    HEMOGLOBIN A1C Lab Results  Component Value Date   HGBA1C 5.9 (H) 05/13/2020   MPG 123 05/13/2020   TSH No results for input(s): TSH in the last 8760 hours.  Medications and allergies  No Known Allergies    Current Outpatient Medications  Medication Instructions   atorvastatin (LIPITOR) 20 mg, Oral, Daily   B Complex-C (SUPER B COMPLEX PO) 1 tablet, Oral, Daily   Cholecalciferol (VITAMIN D3) 5000 units CAPS 1 capsule, Oral, Daily   colchicine 0.6 mg, Oral, Daily   esomeprazole (NEXIUM) 20 mg, Oral, As needed   losartan (COZAAR) 25 MG tablet TAKE 1 TABLET BY MOUTH  DAILY   Magnesium 400 MG CAPS 1 tablet, Oral, Daily   metoprolol succinate (TOPROL-XL) 50 mg, Oral, Daily   Multiple Vitamin (MULTIVITAMIN) tablet 1 tablet, Daily   Pancrelipase, Lip-Prot-Amyl, 24000-76000 units CPEP 24,000 Units, Oral, 3 times daily   Probiotic Product (PROBIOTIC-10 ULTIMATE) CAPS 1 capsule, Oral   Radiology:   CT scan of the abdomen and pelvis 05/21/03/02/2013: Vascular/Lymphatic: Extensive atherosclerosis throughout the abdominal and pelvic vasculature, including an enlarging infrarenal abdominal aortic aneurysm which measures up to 4.3 x 3.2 cm, and an 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  MRI of the abdomen 05/01/2017:  Partially visualized saccular infrarenal abdominal aortic aneurysm measuring at least 4.5 cm.   Chest x-ray PA and lateral view 07/29/2019: The heart size and mediastinal contours are within normal limits. Both lungs are clear. The visualized skeletal structures are unremarkable.  Cardiac Studies:   Exercise Myoview stress test 02/03/2013: 1. Resting EKG showed normal sinus rhythm, LAD, LAFB, IRBBB. Stress EKG was negative for ischemia. Patient exercised on BRUCE PROTOCOL for 9 minutes 6 seconds. The maximum work level achieved was 9.7 MET's. The test was terminated due to fatigue and achievement of the target heart rate. 2. Perfusion imaging study demonstrates normal perfusion without ischemia or scar. Normal left ventricular ejection fraction. Based on the stress results, continued primary prevention is recommended.  Exercise Treadmill Stress Test 03/07/2018:   Indication: Chest pain   The patient exercised on Bruce protocol for 7:29 min. Patient achieved 9.34  METS and reached HR  128 bpm, which is   89% of maximum age-predicted HR.  Stress test terminated due to fatigue.   Exercise capacity was normal for age. HR Response to Exercise: Appropriate. BP Response to Exercise:  Resting hypertesion with normal exercise response.  Chest Pain: none. Arrhythmias: none. Resting EKG demonstrates Normal sinus rhythm with left anterior fascicular block. ST Changes: With peak exercise there was no ST-T changes of  ischemia.   Overall Impression: Normal stress test. Recommendations: Continue primary/secondary prevention.  PCV ECHOCARDIOGRAM COMPLETE 09/19/2020  Left ventricle cavity is normal in size and wall thickness. Normal global wall motion. Normal LV systolic function with EF 65%. Doppler evidence of grade I (impaired) diastolic dysfunction, normal LAP. Trileaflet aortic valve. No evidence of aortic stenosis. Mild (Grade I) aortic regurgitation. Mild aortic valve leaflet calcification. Mild (Grade I) mitral regurgitation. Moderate tricuspid regurgitation. Estimated pulmonary artery systolic pressure 29 mmHg. Mild pulmonic regurgitation. No significant change compared to previous study on 08/03/2019.  Abdominal Aortic Duplex 09/17/2020: The maximum aorta (sac) diameter is 4.69 cm (mid). Moderate dilatation of the abdominal aorta is noted in the mid and distal aorta.  An abdominal aortic aneurysm measuring 4.69 x 3.95 x 3.92 cm is seen.  Normal iliac artery velocity bilaterally.  No significant change since 08/18/2019 and 03/17/2018. Recheck in one year.  EKG  EKG 09/27/2020: Normal sinus rhythm at rate of 66 bpm, left axis deviation, left anterior fascicular block.  Incomplete right bundle branch block.  No evidence of ischemia.  No significant change since EKG 08/14/2019.  Assessment     ICD-10-CM   1. Abdominal aortic aneurysm (AAA) >39 mm diameter  (HCC)  I71.4     2. Primary hypertension  I10     3. Hypercholesteremia  E78.00     4. Stage 3a chronic kidney disease (HCC)  N18.31        There are no discontinued medications.   Recommendations:   Brandon Bradford  is a 80 y.o. Asian male with Patient with past medical history of hypertension, A. 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   This is her annual visit, he continues to remain very active and is essentially asymptomatic.  I reviewed his external labs, blood pressure under excellent control, renal function has remained stable over the years, lipids are also well controlled.  I reviewed the ultrasound of the abdomen and AAA which is moderate size has also remained stable and we will continue to do annual surveillance of the same.  He is on appropriate medical therapy.  I reviewed his external labs.  I'll see him back in a year.   Adrian Prows, MD, [facility] 09/27/2020, 12:54 PM Office: [phone]

## 2020-10-01 ENCOUNTER — Other Ambulatory Visit: Payer: Self-pay | Admitting: Family Medicine

## 2020-11-05 ENCOUNTER — Ambulatory Visit: Payer: Medicare Other | Admitting: Family Medicine

## 2020-11-08 ENCOUNTER — Ambulatory Visit: Payer: Medicare Other | Admitting: Family Medicine

## 2020-11-13 ENCOUNTER — Telehealth: Payer: Medicare Other | Admitting: Family Medicine

## 2020-11-26 ENCOUNTER — Other Ambulatory Visit: Payer: Self-pay

## 2020-11-26 ENCOUNTER — Other Ambulatory Visit (INDEPENDENT_AMBULATORY_CARE_PROVIDER_SITE_OTHER): Payer: Medicare Other

## 2020-11-26 DIAGNOSIS — E785 Hyperlipidemia, unspecified: Secondary | ICD-10-CM | POA: Diagnosis not present

## 2020-11-26 DIAGNOSIS — R7303 Prediabetes: Secondary | ICD-10-CM | POA: Diagnosis not present

## 2020-11-26 LAB — COMPREHENSIVE METABOLIC PANEL
ALT: 28 U/L (ref 0–53)
AST: 23 U/L (ref 0–37)
Albumin: 4.8 g/dL (ref 3.5–5.2)
Alkaline Phosphatase: 60 U/L (ref 39–117)
BUN: 22 mg/dL (ref 6–23)
CO2: 26 mEq/L (ref 19–32)
Calcium: 9.8 mg/dL (ref 8.4–10.5)
Chloride: 104 mEq/L (ref 96–112)
Creatinine, Ser: 1.25 mg/dL (ref 0.40–1.50)
GFR: 54.63 mL/min — ABNORMAL LOW (ref >60.00)
Glucose, Bld: 108 mg/dL — ABNORMAL HIGH (ref 70–99)
Potassium: 4.7 mEq/L (ref 3.5–5.1)
Sodium: 139 mEq/L (ref 135–145)
Total Bilirubin: 1.6 mg/dL — ABNORMAL HIGH (ref 0.2–1.2)
Total Protein: 7.6 g/dL (ref 6.0–8.3)

## 2020-11-26 LAB — LIPID PANEL
Cholesterol: 161 mg/dL (ref 0–200)
HDL: 53.1 mg/dL (ref >39.00)
LDL Cholesterol: 75 mg/dL (ref 0–99)
NonHDL: 108.12
Total CHOL/HDL Ratio: 3
Triglycerides: 164 mg/dL — ABNORMAL HIGH (ref 0.0–149.0)
VLDL: 32.8 mg/dL (ref 0.0–40.0)

## 2020-11-26 LAB — HEMOGLOBIN A1C: Hgb A1c MFr Bld: 6.3 % (ref 4.6–6.5)

## 2020-11-29 ENCOUNTER — Other Ambulatory Visit: Payer: Medicare Other

## 2020-12-04 ENCOUNTER — Other Ambulatory Visit: Payer: Self-pay | Admitting: Family Medicine

## 2020-12-06 ENCOUNTER — Encounter: Payer: Self-pay | Admitting: Family Medicine

## 2020-12-06 ENCOUNTER — Other Ambulatory Visit: Payer: Self-pay

## 2020-12-06 ENCOUNTER — Ambulatory Visit (INDEPENDENT_AMBULATORY_CARE_PROVIDER_SITE_OTHER): Payer: Medicare Other | Admitting: Family Medicine

## 2020-12-06 VITALS — BP 138/76 | HR 72 | Temp 97.9°F | Ht 67.0 in | Wt 180.5 lb

## 2020-12-06 DIAGNOSIS — E785 Hyperlipidemia, unspecified: Secondary | ICD-10-CM | POA: Diagnosis not present

## 2020-12-06 DIAGNOSIS — R0683 Snoring: Secondary | ICD-10-CM

## 2020-12-06 DIAGNOSIS — I10 Essential (primary) hypertension: Secondary | ICD-10-CM | POA: Diagnosis not present

## 2020-12-06 DIAGNOSIS — R7309 Other abnormal glucose: Secondary | ICD-10-CM | POA: Diagnosis not present

## 2020-12-06 DIAGNOSIS — R5383 Other fatigue: Secondary | ICD-10-CM

## 2020-12-06 MED ORDER — METFORMIN HCL 500 MG PO TABS: 500.0000 mg | ORAL_TABLET | Freq: Two times a day (BID) | ORAL | 5 refills | Status: DC

## 2020-12-06 NOTE — Progress Notes (Signed)
Chief Complaint  Patient presents with   Follow-up    Subjective Baran Kuhrt is a 80 y.o. male who presents for hypertension follow up. He does not monitor home blood pressures. He is compliant with medications-losartan 25 mg/d, Toprol XL 50 mg/d. Patient has these side effects of medication: none He is not adhering to a healthy diet overall. Current exercise: walking, moving No CP or SOB.  Hyperlipidemia Patient presents for hyperlipidemia follow up. Currently being treated with Lipitor 20 mg/d and compliance with treatment thus far has been good. He denies myalgias. Diet/exercise as above.  The patient is not known to have coexisting coronary artery disease.  Prediabetes- increased a1c from 5.9 to 6.3.    Past Medical History:  Diagnosis Date   AAA (abdominal aortic aneurysm)    3.4 cm   Cataract 06/2016   bilateral cataract extraction   Colitis, ischemic (HCC)    Gastritis    GERD (gastroesophageal reflux disease)    Hx of colonic polyps    Hyperlipidemia    Hypertension    Paroxysmal atrial fibrillation (HCC)    Vertigo, intermittent     Exam BP 138/76   Pulse 72   Temp 97.9 F (36.6 C) (Oral)   Ht 5\' 7"  (1.702 m)   Wt 180 lb 8 oz (81.9 kg)   SpO2 97%   BMI 28.27 kg/m  General:  well developed, well nourished, in no apparent distress Heart: RRR, no bruits, no LE edema Lungs: clear to auscultation, no accessory muscle use Psych: well oriented with normal range of affect and appropriate judgment/insight  Essential hypertension  Hyperlipidemia, unspecified hyperlipidemia type - Plan: Comprehensive metabolic panel, Lipid panel  PRE-DIABETES - Plan: Hemoglobin A1c, metFORMIN (GLUCOPHAGE) 500 MG tablet  Chronic, stable. Cont Toprol XL 50 mg/d, losartan 25 mg/d. Counseled on diet and exercise. Chronic, stable. Cont Lipitor 20 mg/d.  Chronic. A1c has increased. Will add Metformin 500 mg bid for the next 6 weeks and reassess.  F/u in 6 mo. The patient voiced  understanding and agreement to the plan.  [place], DO 12/06/20  1:53 PM

## 2020-12-06 NOTE — Patient Instructions (Addendum)
Keep the diet clean and stay active.  Aim to do some physical exertion for 150 minutes per week. This is typically divided into 5 days per week, 30 minutes per day. The activity should be enough to get your heart rate up. Anything is better than nothing if you have time constraints.  I recommend getting the updated bivalent covid vaccination booster at your convenience.   If you do not hear anything about your referral in the next 1-2 weeks, call our office and ask for an update.  Let us know if you need anything.

## 2021-01-14 ENCOUNTER — Telehealth: Payer: Self-pay | Admitting: Family Medicine

## 2021-01-14 NOTE — Telephone Encounter (Signed)
Pt's spouse came in office stating pt needing to see provider having heart beat issues for over a wk, pt declined to go to ER or urgent came, pt's spouse states pt does not want to see other providers that are not MD- pt was offer appt today with another provider but did not want to see other providers that are not MD. Please advise. Pt tel [phone] or [phone].

## 2021-01-14 NOTE — Telephone Encounter (Signed)
FYI

## 2021-01-14 NOTE — Telephone Encounter (Signed)
Pt has an appt 2/28 at 7:15

## 2021-01-14 NOTE — Telephone Encounter (Signed)
Pt has appt tomorrow 12/28

## 2021-01-15 ENCOUNTER — Encounter: Payer: Self-pay | Admitting: Family Medicine

## 2021-01-15 ENCOUNTER — Ambulatory Visit (INDEPENDENT_AMBULATORY_CARE_PROVIDER_SITE_OTHER): Payer: Medicare Other | Admitting: Family Medicine

## 2021-01-15 VITALS — BP 140/76 | HR 63 | Temp 97.9°F | Ht 67.0 in | Wt 179.0 lb

## 2021-01-15 DIAGNOSIS — I10 Essential (primary) hypertension: Secondary | ICD-10-CM | POA: Diagnosis not present

## 2021-01-15 DIAGNOSIS — R002 Palpitations: Secondary | ICD-10-CM | POA: Diagnosis not present

## 2021-01-15 LAB — COMPREHENSIVE METABOLIC PANEL
ALT: 17 U/L (ref 0–53)
AST: 20 U/L (ref 0–37)
Albumin: 4.5 g/dL (ref 3.5–5.2)
Alkaline Phosphatase: 59 U/L (ref 39–117)
BUN: 25 mg/dL — ABNORMAL HIGH (ref 6–23)
CO2: 29 mEq/L (ref 19–32)
Calcium: 9.7 mg/dL (ref 8.4–10.5)
Chloride: 105 mEq/L (ref 96–112)
Creatinine, Ser: 1.29 mg/dL (ref 0.40–1.50)
GFR: 52.55 mL/min — ABNORMAL LOW (ref >60.00)
Glucose, Bld: 114 mg/dL — ABNORMAL HIGH (ref 70–99)
Potassium: 4.7 mEq/L (ref 3.5–5.1)
Sodium: 141 mEq/L (ref 135–145)
Total Bilirubin: 1 mg/dL (ref 0.2–1.2)
Total Protein: 7.1 g/dL (ref 6.0–8.3)

## 2021-01-15 LAB — CBC
HCT: 43.3 % (ref 39.0–52.0)
Hemoglobin: 14.6 g/dL (ref 13.0–17.0)
MCHC: 33.8 g/dL (ref 30.0–36.0)
MCV: 96.7 fl (ref 78.0–100.0)
Platelets: 213 10*3/uL (ref 150.0–400.0)
RBC: 4.47 Mil/uL (ref 4.22–5.81)
RDW: 12.9 % (ref 11.5–15.5)
WBC: 5.6 10*3/uL (ref 4.0–10.5)

## 2021-01-15 LAB — TSH: TSH: 4.38 u[IU]/mL (ref 0.35–5.50)

## 2021-01-15 LAB — T4, FREE: Free T4: 0.75 ng/dL (ref 0.60–1.60)

## 2021-01-15 MED ORDER — LOSARTAN POTASSIUM 25 MG PO TABS: 25.0000 mg | ORAL_TABLET | Freq: Every day | ORAL | 3 refills | Status: DC

## 2021-01-15 NOTE — Progress Notes (Signed)
Chief Complaint  Patient presents with   Neck Pain    Problem with blood pressure     Brandon Bradford is a 80 y.o. male here for palpitations. Here w spouse who helps w hx.   Length of issue: 2 days How long does it last: 2 hours Light headedness/passing out? Yes when he stands up Chest pain/shortness of breath? No Hx of arrhythmia? No He is on Toprol XL 50 mg/d and reports compliance.  Medication changes/illicit substances? No Started after he had a a bean soup for a [place] tradition.   6 d ago, had L sided neck pain. Woke up like this, no inj or change in activity.   Past Medical History:  Diagnosis Date   AAA (abdominal aortic aneurysm)    3.4 cm   Cataract 06/2016   bilateral cataract extraction   Colitis, ischemic (HCC)    Gastritis    GERD (gastroesophageal reflux disease)    Hx of colonic polyps    Hyperlipidemia    Hypertension    Paroxysmal atrial fibrillation (HCC)    Vertigo, intermittent    Past Surgical History:  Procedure Laterality Date   APPENDECTOMY  1971   CATARACT EXTRACTION, BILATERAL     HEMICOLECTOMY     Right   HEMORROIDECTOMY  2002   No Known Allergies Allergies as of 01/15/2021   No Known Allergies      Medication List        Accurate as of January 15, 2021  8:42 AM. If you have any questions, ask your nurse or doctor.          atorvastatin 20 MG tablet Commonly known as: LIPITOR TAKE 1 TABLET BY MOUTH  DAILY   betamethasone valerate 0.1 % cream Commonly known as: VALISONE Apply 1 application topically as needed.   colchicine 0.6 MG tablet Take 1 tablet (0.6 mg total) by mouth daily. What changed:  when to take this reasons to take this   esomeprazole 20 MG capsule Commonly known as: NEXIUM Take 20 mg by mouth as needed.   losartan 25 MG tablet Commonly known as: COZAAR Take 1 tablet (25 mg total) by mouth daily.   Magnesium 400 MG Caps Take 1 tablet by mouth daily.   metFORMIN 500 MG tablet Commonly known as:  GLUCOPHAGE Take 1 tablet (500 mg total) by mouth 2 (two) times daily with a meal.   metoprolol succinate 50 MG 24 hr tablet Commonly known as: TOPROL-XL TAKE 1 TABLET BY MOUTH  DAILY   multivitamin tablet Take 1 tablet by mouth daily.   Pancrelipase (Lip-Prot-Amyl) 24000-76000 units Cpep Take 1 capsule (24,000 Units total) by mouth 3 (three) times daily. What changed:  when to take this reasons to take this   Probiotic-10 Ultimate Caps Take 1 capsule by mouth.   SUPER B COMPLEX PO Take 1 tablet by mouth daily.   Vitamin D3 125 MCG (5000 UT) Caps Take 1 capsule by mouth daily.        BP 140/76    Pulse 63    Temp 97.9 F (36.6 C) (Oral)    Ht 5\' 7"  (1.702 m)    Wt 179 lb (81.2 kg)    SpO2 99%    BMI 28.04 kg/m  Gen: Awake, alert, appearing stated age Eyes: PERRLA Mouth: MMM Heart: RRR, no bruits, no LE edema Lungs: CTAB, no accessory muscle use Neuro: No cerebellar signs, DTR's equal and symmetric throughout MSK: No muscle group atrophy or asymmetry Psych: Age appropriate  judgment and insight, mood/affect WNL  Palpitations - Plan: CBC, Comprehensive metabolic panel, TSH, T4, free, EKG 12-Lead  Primary hypertension - Plan: losartan (COZAAR) 25 MG tablet  New, uncertain prog. EKG shows NSR, normal axis, no interval abnormalities, no ST segment or T wave changes, good R wave progression. Could be related to reflux s/s's. Consider taking 40 mg Nexium during certain meals. Doubt arrhythmia or ischemia. Controlled at this time. Cont losartan 25 mg/d and Toprol XL 50 mg/d. Lightheadedness likely 2/2 orthostasis. Needs to stay hydrated.  The patient and his spouse voiced understanding and agreement to the plan.  Brandon Bradford 8:42 AM 01/15/21

## 2021-01-15 NOTE — Patient Instructions (Signed)
Give [place] 2-3 business days to get the results of your labs back.   Keep the diet clean and stay active.  Stay hydrated. This can really cause issues if we don't.   If you eat something you know is going to flare your reflux, take an extra Nexium.   Let us know if you need anything.

## 2021-01-16 ENCOUNTER — Other Ambulatory Visit: Payer: Self-pay | Admitting: Family Medicine

## 2021-01-29 DIAGNOSIS — Z23 Encounter for immunization: Secondary | ICD-10-CM | POA: Diagnosis not present

## 2021-02-04 ENCOUNTER — Institutional Professional Consult (permissible substitution): Payer: Medicare Other | Admitting: Pulmonary Disease

## 2021-05-22 DIAGNOSIS — H9313 Tinnitus, bilateral: Secondary | ICD-10-CM | POA: Diagnosis not present

## 2021-05-22 DIAGNOSIS — H838X3 Other specified diseases of inner ear, bilateral: Secondary | ICD-10-CM | POA: Diagnosis not present

## 2021-05-22 DIAGNOSIS — H903 Sensorineural hearing loss, bilateral: Secondary | ICD-10-CM | POA: Diagnosis not present

## 2021-06-03 ENCOUNTER — Other Ambulatory Visit (INDEPENDENT_AMBULATORY_CARE_PROVIDER_SITE_OTHER): Payer: Medicare Other

## 2021-06-03 DIAGNOSIS — E785 Hyperlipidemia, unspecified: Secondary | ICD-10-CM

## 2021-06-03 DIAGNOSIS — R7309 Other abnormal glucose: Secondary | ICD-10-CM

## 2021-06-03 LAB — COMPREHENSIVE METABOLIC PANEL
ALT: 22 U/L (ref 0–53)
AST: 27 U/L (ref 0–37)
Albumin: 4.7 g/dL (ref 3.5–5.2)
Alkaline Phosphatase: 57 U/L (ref 39–117)
BUN: 30 mg/dL — ABNORMAL HIGH (ref 6–23)
CO2: 27 mEq/L (ref 19–32)
Calcium: 10.2 mg/dL (ref 8.4–10.5)
Chloride: 103 mEq/L (ref 96–112)
Creatinine, Ser: 1.46 mg/dL (ref 0.40–1.50)
GFR: 45.18 mL/min — ABNORMAL LOW (ref >60.00)
Glucose, Bld: 110 mg/dL — ABNORMAL HIGH (ref 70–99)
Potassium: 4.8 mEq/L (ref 3.5–5.1)
Sodium: 139 mEq/L (ref 135–145)
Total Bilirubin: 1.7 mg/dL — ABNORMAL HIGH (ref 0.2–1.2)
Total Protein: 7.5 g/dL (ref 6.0–8.3)

## 2021-06-03 LAB — LIPID PANEL
Cholesterol: 164 mg/dL (ref 0–200)
HDL: 64.3 mg/dL (ref >39.00)
LDL Cholesterol: 77 mg/dL (ref 0–99)
NonHDL: 99.3
Total CHOL/HDL Ratio: 3
Triglycerides: 111 mg/dL (ref 0.0–149.0)
VLDL: 22.2 mg/dL (ref 0.0–40.0)

## 2021-06-03 LAB — HEMOGLOBIN A1C: Hgb A1c MFr Bld: 6 % (ref 4.6–6.5)

## 2021-06-06 ENCOUNTER — Ambulatory Visit (INDEPENDENT_AMBULATORY_CARE_PROVIDER_SITE_OTHER): Payer: Medicare Other | Admitting: Family Medicine

## 2021-06-06 ENCOUNTER — Encounter: Payer: Self-pay | Admitting: Family Medicine

## 2021-06-06 VITALS — BP 127/78 | HR 65 | Temp 98.2°F | Ht 67.0 in | Wt 179.4 lb

## 2021-06-06 DIAGNOSIS — M546 Pain in thoracic spine: Secondary | ICD-10-CM

## 2021-06-06 DIAGNOSIS — E785 Hyperlipidemia, unspecified: Secondary | ICD-10-CM | POA: Diagnosis not present

## 2021-06-06 DIAGNOSIS — I714 Abdominal aortic aneurysm, without rupture, unspecified: Secondary | ICD-10-CM

## 2021-06-06 DIAGNOSIS — R7303 Prediabetes: Secondary | ICD-10-CM

## 2021-06-06 DIAGNOSIS — I10 Essential (primary) hypertension: Secondary | ICD-10-CM

## 2021-06-06 DIAGNOSIS — G8929 Other chronic pain: Secondary | ICD-10-CM

## 2021-06-06 MED ORDER — METOPROLOL SUCCINATE ER 50 MG PO TB24: 50.0000 mg | ORAL_TABLET | Freq: Every day | ORAL | 3 refills | Status: DC

## 2021-06-06 NOTE — Patient Instructions (Addendum)
Keep the diet clean and stay active.  If you want physical therapy for your back area, please let me know.   Heat (pad or rice pillow in microwave) over affected area, 10-15 minutes twice daily.   Ice/cold pack over area for 10-15 min twice daily.  OK to take Tylenol 1000 mg (2 extra strength tabs) or 975 mg (3 regular strength tabs) every 6 hours as needed.  Let us know if you need anything.

## 2021-06-06 NOTE — Progress Notes (Signed)
Chief Complaint  Patient presents with   Follow-up    6 month     Subjective Brandon Bradford is a 81 y.o. male who presents for hypertension follow up. He does not monitor home blood pressures. He is compliant with medication- Toprol XL 50 mg/d, losartan 25 mg/d. Patient has these side effects of medication: none He is adhering to a healthy diet overall. Current exercise: golfing, walking No Cp or SOB.  Hyperlipidemia Patient presents for hyperlipidemia follow up. Currently being treated with Lipitor 20 mg/d and compliance with treatment thus far has been good. He denies myalgias. Diet/exercise as above.  The patient is known to have coexisting coronary artery disease.  AAA While reviewing his chart, I noticed a CT scan done in 2016 that found an abdominal infrarenal aortic aneurysm.  1 year follow-up was recommended.  The patient does not remember doing this negative not severe thing in the chart.  This was prior to establishing with me.  He is not having any pulsation in his abdominal region or pain.  Patient is a 14-year history of right posterior side pain.  No specific injury or change in activity.  It largely does not affect his quality of life.  He denies any bruising, swelling, or redness.   Past Medical History:  Diagnosis Date   AAA (abdominal aortic aneurysm) (HCC)    3.4 cm   Cataract 06/2016   bilateral cataract extraction   Colitis, ischemic (HCC)    Gastritis    GERD (gastroesophageal reflux disease)    Hx of colonic polyps    Hyperlipidemia    Hypertension    Paroxysmal atrial fibrillation (HCC)    Vertigo, intermittent     Exam BP 127/78   Pulse 65   Temp 98.2 F (36.8 C) (Oral)   Ht '5\' 7"'$  (1.702 m)   Wt 179 lb 6 oz (81.4 kg)   SpO2 94%   BMI 28.09 kg/m  General:  well developed, well nourished, in no apparent distress Heart: RRR, no bruits, no LE edema Lungs: clear to auscultation, no accessory muscle use Abdomen: No pulsatile masses  appreciated MSK: No TTP over the posterior CVAs or thoracolumbar region Psych: well oriented with normal range of affect and appropriate judgment/insight  Essential hypertension  Hyperlipidemia, unspecified hyperlipidemia type - Plan: Comprehensive metabolic panel, Lipid panel  Abdominal aortic aneurysm (AAA) without rupture, unspecified part ([place]) - Plan: US AORTA  Chronic right-sided thoracic back pain  Prediabetes - Plan: Hemoglobin A1c  Chronic, stable.  Continue Toprol XL 50 mg daily, losartan 25 mg daily.  Counseled on diet and exercise. Chronic, stable.  Continue Lipitor 20 mg daily. We will follow-up with ultrasound of the aorta.  He is asymptomatic. Chronic, not controlled.  I reassured him that there is nothing going on with his internal organs, particularly his kidneys.  Anatomically speaking, there is no other significant internal organ in the area of interest.  I offered physical therapy which he politely declined at this time.  He will let me know if he changes his mind. F/u in 6 mo or prn. The patient voiced understanding and agreement to the plan.  [place], DO 06/06/21  2:49 PM

## 2021-06-10 ENCOUNTER — Other Ambulatory Visit (HOSPITAL_BASED_OUTPATIENT_CLINIC_OR_DEPARTMENT_OTHER): Payer: Medicare Other

## 2021-06-12 ENCOUNTER — Ambulatory Visit (HOSPITAL_BASED_OUTPATIENT_CLINIC_OR_DEPARTMENT_OTHER)
Admission: RE | Admit: 2021-06-12 | Discharge: 2021-06-12 | Disposition: A | Discharge status: Home / Self Care | Payer: Medicare Other | Source: Ambulatory Visit | Attending: Family Medicine | Admitting: Family Medicine

## 2021-06-12 DIAGNOSIS — I714 Abdominal aortic aneurysm, without rupture, unspecified: Secondary | ICD-10-CM | POA: Diagnosis not present

## 2021-06-12 DIAGNOSIS — I7143 Infrarenal abdominal aortic aneurysm, without rupture: Secondary | ICD-10-CM | POA: Diagnosis not present

## 2021-07-24 ENCOUNTER — Encounter (HOSPITAL_BASED_OUTPATIENT_CLINIC_OR_DEPARTMENT_OTHER): Payer: Self-pay | Admitting: Emergency Medicine

## 2021-07-24 ENCOUNTER — Emergency Department (HOSPITAL_BASED_OUTPATIENT_CLINIC_OR_DEPARTMENT_OTHER)
Admission: EM | Admit: 2021-07-24 | Discharge: 2021-07-24 | Disposition: A | Discharge status: Home / Self Care | Payer: Medicare Other | Attending: Emergency Medicine | Admitting: Emergency Medicine

## 2021-07-24 ENCOUNTER — Other Ambulatory Visit: Payer: Self-pay

## 2021-07-24 ENCOUNTER — Emergency Department (HOSPITAL_BASED_OUTPATIENT_CLINIC_OR_DEPARTMENT_OTHER): Payer: Medicare Other

## 2021-07-24 DIAGNOSIS — R42 Dizziness and giddiness: Secondary | ICD-10-CM

## 2021-07-24 DIAGNOSIS — I4891 Unspecified atrial fibrillation: Secondary | ICD-10-CM | POA: Diagnosis not present

## 2021-07-24 DIAGNOSIS — E860 Dehydration: Secondary | ICD-10-CM | POA: Insufficient documentation

## 2021-07-24 DIAGNOSIS — I480 Paroxysmal atrial fibrillation: Secondary | ICD-10-CM | POA: Diagnosis not present

## 2021-07-24 DIAGNOSIS — R519 Headache, unspecified: Secondary | ICD-10-CM | POA: Diagnosis not present

## 2021-07-24 DIAGNOSIS — R110 Nausea: Secondary | ICD-10-CM | POA: Diagnosis not present

## 2021-07-24 LAB — URINALYSIS, ROUTINE W REFLEX MICROSCOPIC
Bilirubin Urine: NEGATIVE
Glucose, UA: NEGATIVE mg/dL
Hgb urine dipstick: NEGATIVE
Ketones, ur: NEGATIVE mg/dL
Leukocytes,Ua: NEGATIVE
Nitrite: NEGATIVE
Protein, ur: NEGATIVE mg/dL
Specific Gravity, Urine: 1.01 (ref 1.005–1.030)
pH: 5.5 (ref 5.0–8.0)

## 2021-07-24 LAB — MAGNESIUM: Magnesium: 2.3 mg/dL (ref 1.7–2.4)

## 2021-07-24 LAB — BASIC METABOLIC PANEL
Anion gap: 6 (ref 5–15)
BUN: 25 mg/dL — ABNORMAL HIGH (ref 8–23)
CO2: 25 mmol/L (ref 22–32)
Calcium: 9.5 mg/dL (ref 8.9–10.3)
Chloride: 108 mmol/L (ref 98–111)
Creatinine, Ser: 1.55 mg/dL — ABNORMAL HIGH (ref 0.61–1.24)
GFR, Estimated: 45 mL/min — ABNORMAL LOW (ref >60)
Glucose, Bld: 114 mg/dL — ABNORMAL HIGH (ref 70–99)
Potassium: 4.1 mmol/L (ref 3.5–5.1)
Sodium: 139 mmol/L (ref 135–145)

## 2021-07-24 LAB — CBC
HCT: 42.4 % (ref 39.0–52.0)
Hemoglobin: 14.5 g/dL (ref 13.0–17.0)
MCH: 32.6 pg (ref 26.0–34.0)
MCHC: 34.2 g/dL (ref 30.0–36.0)
MCV: 95.3 fL (ref 80.0–100.0)
Platelets: 223 10*3/uL (ref 150–400)
RBC: 4.45 MIL/uL (ref 4.22–5.81)
RDW: 12.2 % (ref 11.5–15.5)
WBC: 7.1 10*3/uL (ref 4.0–10.5)
nRBC: 0 % (ref 0.0–0.2)

## 2021-07-24 LAB — TROPONIN I (HIGH SENSITIVITY): Troponin I (High Sensitivity): 9 ng/L (ref <18)

## 2021-07-24 LAB — TSH: TSH: 2.44 u[IU]/mL (ref 0.350–4.500)

## 2021-07-24 LAB — CBG MONITORING, ED: Glucose-Capillary: 146 mg/dL — ABNORMAL HIGH (ref 70–99)

## 2021-07-24 MED ORDER — FLECAINIDE ACETATE 50 MG PO TABS: 50.0000 mg | ORAL_TABLET | Freq: Two times a day (BID) | ORAL | 0 refills | Status: DC

## 2021-07-24 MED ORDER — SODIUM CHLORIDE 0.9 % IV BOLUS
1000.0000 mL | Freq: Once | INTRAVENOUS | Status: AC
  Administered 2021-07-24: 1000 mL via INTRAVENOUS

## 2021-07-24 MED ORDER — RIVAROXABAN 15 MG PO TABS: 15.0000 mg | ORAL_TABLET | Freq: Every day | ORAL | 0 refills | Status: DC

## 2021-07-24 NOTE — Discharge Instructions (Signed)
You were seen in the emergency department for dizziness and lightheadedness.  You had lab work EKG chest x-ray and a CAT scan.  Your atrial fibrillation has recurred.  Cardiology is recommending we start you back on a blood thinner and medication for your heart.  Please follow-up with Dr. Einar Gip, the office should call you for an appointment.  Return to the emergency department if any worsening or concerning symptoms

## 2021-07-24 NOTE — ED Triage Notes (Signed)
Pt c/o dizziness, headache, nausea x 4 days.

## 2021-07-24 NOTE — Progress Notes (Addendum)
ON-CALL CARDIOLOGY 07/24/21  Patient's name: Brandon Bradford.   MRN: [id number].    DOB: 10-Jul-1940 Primary care provider: Shelda Pal, DO. Primary cardiologist: Dr. Adrian Prows  Interaction regarding this patient's care today: Was called by the ED provider for recommendations for atrial fibrillation.  Patient has been experiencing intermittent episodes of lightheaded dizziness and palpitations.  EKG notes rate controlled atrial fibrillation.  Upon further review it appears the patient was diagnosed with atrial fibrillation back in 2011 (EKG available in media section) and at that time had converted to normal sinus rhythm with flecainide.  Currently not on antiarrhythmics and anticoagulation.  CHA2DS2-VASc SCORE is 4 which correlates to 4% risk of stroke per year (age, HTN, aortic atherosclerosis).   High sensitive troponin negative x1.  TSH pending  CrCl (weight 81 kg per ED physician) 45 mL/min  Impression: Symptomatic atrial fibrillation   Recommendations: Clinically patient appears to be stable per ED physician's assessment requesting further recommendations.  Patient's atrial fibrillation appears to be rate controlled.  Continue metoprolol.    Start flecainide 50 mg p.o. twice daily.  If there are no contraindications to oral anticoagulation and patient agrees after discussing the risks, benefits and alternatives to oral anticoagulation would recommend starting Xarelto 15 mg p.o. q. Supper as per his creatinine clearance.  Dr. Melina Copa to discuss medication profile with the patient.  We will arrange outpatient follow-up with Dr. Einar Gip in the following week.  Patient is asked to call the office if he does not hear back from [place] in the next 24 to 48 hours.  Telephone encounter total time: 13 minutes.  Rex Kras, [place], [facility]  Pager: [phone] Office: [phone]

## 2021-07-24 NOTE — ED Notes (Addendum)
[facility] will paged Dr. Adan Sis

## 2021-07-24 NOTE — ED Notes (Signed)
Patient denies pain and is resting comfortably.  

## 2021-07-24 NOTE — ED Provider Notes (Signed)
[facility] Provider Note   CSN: [id number] Arrival date & time: 07/24/21  1411     History  Chief Complaint  Patient presents with   Dizziness    Brandon Bradford is a 81 y.o. male.  He is brought in by his wife who is helping with the history.  He is complaining of dizziness lightheadedness that is been going on for the last 4 days.  Its worse when he bends down to pick up something and stands up.  He feels like he is going to pass out.  It is associated with a headache.  No blurry vision double vision numbness weakness.  No chest pain or shortness of breath but he does feel like he has been running around with his heart beating fast and feeling sweaty.  He denies a history of abnormal heart rhythm.  He sees a cardiologist Dr. Einar Gip every year. Played a round of golf today, despite dizziness.   The history is provided by the patient and the spouse. The history is limited by a language barrier. A language interpreter was used ([place]).  Dizziness Quality:  Lightheadedness Severity:  Moderate Onset quality:  Sudden Duration:  4 days Timing:  Intermittent Progression:  Unchanged Chronicity:  New Context: standing up   Context: not with loss of consciousness   Relieved by:  None tried Worsened by:  Standing up Ineffective treatments:  Fluids and lying down Associated symptoms: headaches and palpitations   Associated symptoms: no chest pain, no diarrhea, no hearing loss, no nausea, no shortness of breath, no syncope, no tinnitus, no vision changes, no vomiting and no weakness        Home Medications Prior to Admission medications   Medication Sig Start Date End Date Taking? Authorizing Provider  atorvastatin (LIPITOR) 20 MG tablet TAKE 1 TABLET BY MOUTH ONCE  DAILY 01/16/21   Wendling, Crosby Oyster, DO  B Complex-C (SUPER B COMPLEX PO) Take 1 tablet by mouth daily.    [provider]  betamethasone valerate (VALISONE) 0.1 % cream Apply 1  application topically as needed. 05/26/16   [provider]  Cholecalciferol (VITAMIN D3) 5000 units CAPS Take 1 capsule by mouth daily.    [provider]  colchicine 0.6 MG tablet Take 1 tablet (0.6 mg total) by mouth daily. Patient taking differently: Take 0.6 mg by mouth as needed. 03/21/19   Shelda Pal, DO  esomeprazole (NEXIUM) 20 MG capsule Take 20 mg by mouth as needed.     [provider]  losartan (COZAAR) 25 MG tablet Take 1 tablet (25 mg total) by mouth daily. 01/15/21   Shelda Pal, DO  Magnesium 400 MG CAPS Take 1 tablet by mouth daily.    [provider]  metoprolol succinate (TOPROL-XL) 50 MG 24 hr tablet Take 1 tablet (50 mg total) by mouth daily. Take with or immediately following a meal. 06/06/21   Wendling, Crosby Oyster, DO  Multiple Vitamin (MULTIVITAMIN) tablet Take 1 tablet by mouth daily.    [provider]  Pancrelipase, Lip-Prot-Amyl, 24000-76000 units CPEP Take 1 capsule (24,000 Units total) by mouth 3 (three) times daily. Patient taking differently: Take 1 capsule by mouth as needed. 12/05/19   Shelda Pal, DO  Probiotic Product (PROBIOTIC-10 ULTIMATE) CAPS Take 1 capsule by mouth.    [provider]      Allergies    Patient has no known allergies.    Review of Systems   Review of  Systems  Constitutional:  Positive for diaphoresis. Negative for fever.  HENT:  Negative for hearing loss and tinnitus.   Eyes:  Negative for visual disturbance.  Respiratory:  Negative for shortness of breath.   Cardiovascular:  Positive for palpitations. Negative for chest pain and syncope.  Gastrointestinal:  Negative for diarrhea, nausea and vomiting.  Musculoskeletal:  Negative for neck pain.  Skin:  Negative for rash.  Neurological:  Positive for dizziness and headaches. Negative for weakness.    Physical Exam Updated Vital Signs BP 126/75 (BP Location: Left Arm)   Pulse 77   Temp 97.9  F (36.6 C) (Oral)   Resp 20   SpO2 96%  Physical Exam Vitals and nursing note reviewed.  Constitutional:      General: He is not in acute distress.    Appearance: Normal appearance. He is well-developed.  HENT:     Head: Normocephalic and atraumatic.  Eyes:     Conjunctiva/sclera: Conjunctivae normal.  Cardiovascular:     Rate and Rhythm: Normal rate. Rhythm irregular.     Pulses: Normal pulses.     Heart sounds: No murmur heard. Pulmonary:     Effort: Pulmonary effort is normal. No respiratory distress.     Breath sounds: Normal breath sounds.  Abdominal:     Palpations: Abdomen is soft.     Tenderness: There is no abdominal tenderness. There is no guarding or rebound.  Musculoskeletal:     Cervical back: Neck supple.     Right lower leg: No edema.     Left lower leg: No edema.  Skin:    General: Skin is warm and dry.     Capillary Refill: Capillary refill takes less than 2 seconds.  Neurological:     General: No focal deficit present.     Mental Status: He is alert.     Cranial Nerves: No cranial nerve deficit.     Sensory: No sensory deficit.     Motor: No weakness.     Gait: Gait normal.  Psychiatric:        Mood and Affect: Mood normal.     ED Results / Procedures / Treatments   Labs (all labs ordered are listed, but only abnormal results are displayed) Labs Reviewed  BASIC METABOLIC PANEL - Abnormal; Notable for the following components:      Result Value   Glucose, Bld 114 (*)    BUN 25 (*)    Creatinine, Ser 1.55 (*)    GFR, Estimated 45 (*)    All other components within normal limits  CBG MONITORING, ED - Abnormal; Notable for the following components:   Glucose-Capillary 146 (*)    All other components within normal limits  CBC  URINALYSIS, ROUTINE W REFLEX MICROSCOPIC  TSH  MAGNESIUM  CBG MONITORING, ED  TROPONIN I (HIGH SENSITIVITY)  TROPONIN I (HIGH SENSITIVITY)    EKG EKG Interpretation  Date/Time:  Thursday July 24 2021 14:19:22  EDT Ventricular Rate:  68 PR Interval:    QRS Duration: 106 QT Interval:  410 QTC Calculation: 435 R Axis:   -42 Text Interpretation: Atrial fibrillation Left axis deviation Abnormal ECG When compared with ECG of 29-Jul-2019 17:42, afib is new from prior 6/21 Confirmed by Aletta Edouard [phone]) on 07/24/2021 3:11:09 PM  Radiology CT HEAD WO CONTRAST  Result Date: 07/24/2021 CLINICAL DATA:  Dizziness, persistent, recurrent. EXAM: CT HEAD WITHOUT CONTRAST TECHNIQUE: Contiguous axial images were obtained from the base of the skull through the vertex without intravenous  contrast. RADIATION DOSE REDUCTION: This exam was performed according to the departmental dose-optimization program which includes automated exposure control, adjustment of the mA and/or kV according to patient size and/or use of iterative reconstruction technique. COMPARISON:  None Available. FINDINGS: Brain: No evidence of acute infarction, hemorrhage, hydrocephalus, extra-axial collection or mass lesion/mass effect. Scattered area of low attenuation in the periventricular and subcortical white matter presumed chronic microvascular ischemic changes. Mild cerebral atrophy. Vascular: No hyperdense vessel or unexpected calcification. Skull: Normal. Negative for fracture or focal lesion. Sinuses/Orbits: No acute finding.  Bilateral lens extraction. Other: None. IMPRESSION: No acute intracranial abnormality. Electronically Signed   By: Keane Police D.O.   On: 07/24/2021 16:01   DG Chest Port 1 View  Result Date: 07/24/2021 CLINICAL DATA:  Dizziness, headache, and nausea for 4 days EXAM: PORTABLE CHEST 1 VIEW COMPARISON:  Portable exam 1545 hours compared to 06/18/2020 FINDINGS: Normal heart size, mediastinal contours, and pulmonary vascularity. Atherosclerotic calcification aorta. Lungs clear. No pulmonary infiltrate, pleural effusion, or pneumothorax. Osseous structures unremarkable. IMPRESSION: No acute abnormalities. Electronically Signed    By: Lavonia Dana M.D.   On: 07/24/2021 15:57    Procedures Procedures    Medications Ordered in ED Medications  sodium chloride 0.9 % bolus 1,000 mL (has no administration in time range)    ED Course/ Medical Decision Making/ A&P Clinical Course as of 07/25/21 0929  Thu Jul 24, 2021  1607 Chest x-ray and head CT did not show any acute findings.  Awaiting radiology reading. [MB]  7124 Patient ambulated to the bathroom without any difficulty. [MB]  5809 Discussed with Dr. Terri Skains covering for Dr. Einar Gip.  He is recommending the patient's start on Xarelto 15 q. supper and flecainide 50 mg twice a day.  Office will reach out to patient for close follow-up. [MB]    Clinical Course User Index [MB] Hayden Rasmussen, MD                           Medical Decision Making Amount and/or Complexity of Data Reviewed Labs: ordered. Radiology: ordered.  Risk Prescription drug management.  This patient complains of dizziness and lightheadedness, episodes of diaphoresis; this involves an extensive number of treatment Options and is a complaint that carries with it a high risk of complications and morbidity. The differential includes ACS, arrhythmia, anemia, stroke, bleed, dehydration, metabolic derangement  I ordered, reviewed and interpreted labs, which included CBC with normal white count normal heme, chemistries with mild elevation of creatinine slightly up from priors, troponins normal, TSH normal urinalysis without signs of I ordered medication IV fluids and reviewed PMP when indicated. I ordered imaging studies which included chest x-ray and head CT and I independently    visualized and interpreted imaging which showed no acute findings Additional history obtained from patient's wife Previous records obtained and reviewed in epic including prior PCP and cardiology notes I consulted Dr. Terri Skains cardiology covering Dr. Einar Gip and discussed lab and imaging findings and discussed disposition.   Cardiac monitoring reviewed, A-fib with controlled ventricular response Social determinants considered, no significant barriers Critical Interventions: None  After the interventions stated above, I reevaluated the patient and found fairly asymptomatic in the department.  He has been ambulatory here without any difficulty. Admission and further testing considered, no indications for admission or further work-up at this time.  No focal findings.  Will start on anticoagulation and flecainide per cardiology recommendations.  Patient counseled to follow-up with cardiology  and return instructions discussed.          Final Clinical Impression(s) / ED Diagnoses Final diagnoses:  Dizziness  Dehydration  Paroxysmal atrial fibrillation ([place])    Rx / DC Orders ED Discharge Orders          Ordered    Rivaroxaban (XARELTO) 15 MG TABS tablet  Daily with supper        07/24/21 1711    flecainide (TAMBOCOR) 50 MG tablet  2 times daily        07/24/21 1711              Hayden Rasmussen, MD 07/25/21 [phone]

## 2021-07-28 DIAGNOSIS — R739 Hyperglycemia, unspecified: Secondary | ICD-10-CM | POA: Diagnosis not present

## 2021-07-28 DIAGNOSIS — I129 Hypertensive chronic kidney disease with stage 1 through stage 4 chronic kidney disease, or unspecified chronic kidney disease: Secondary | ICD-10-CM | POA: Diagnosis not present

## 2021-07-28 DIAGNOSIS — K2970 Gastritis, unspecified, without bleeding: Secondary | ICD-10-CM | POA: Diagnosis not present

## 2021-07-28 DIAGNOSIS — I714 Abdominal aortic aneurysm, without rupture, unspecified: Secondary | ICD-10-CM | POA: Diagnosis not present

## 2021-07-28 DIAGNOSIS — R351 Nocturia: Secondary | ICD-10-CM | POA: Diagnosis not present

## 2021-07-28 DIAGNOSIS — R109 Unspecified abdominal pain: Secondary | ICD-10-CM | POA: Diagnosis not present

## 2021-07-28 DIAGNOSIS — N183 Chronic kidney disease, stage 3 unspecified: Secondary | ICD-10-CM | POA: Diagnosis not present

## 2021-07-28 DIAGNOSIS — I4891 Unspecified atrial fibrillation: Secondary | ICD-10-CM | POA: Diagnosis not present

## 2021-07-28 DIAGNOSIS — M109 Gout, unspecified: Secondary | ICD-10-CM | POA: Diagnosis not present

## 2021-07-28 DIAGNOSIS — Z9049 Acquired absence of other specified parts of digestive tract: Secondary | ICD-10-CM | POA: Diagnosis not present

## 2021-07-29 ENCOUNTER — Ambulatory Visit: Payer: Medicare Other | Admitting: Cardiology

## 2021-07-29 ENCOUNTER — Other Ambulatory Visit: Payer: Self-pay | Admitting: Nephrology

## 2021-07-29 ENCOUNTER — Other Ambulatory Visit (HOSPITAL_BASED_OUTPATIENT_CLINIC_OR_DEPARTMENT_OTHER): Payer: Self-pay | Admitting: Nephrology

## 2021-07-29 ENCOUNTER — Encounter: Payer: Self-pay | Admitting: Cardiology

## 2021-07-29 VITALS — BP 112/67 | HR 68 | Temp 97.9°F | Resp 17 | Ht 67.0 in | Wt 180.4 lb

## 2021-07-29 DIAGNOSIS — I714 Abdominal aortic aneurysm, without rupture, unspecified: Secondary | ICD-10-CM

## 2021-07-29 DIAGNOSIS — I480 Paroxysmal atrial fibrillation: Secondary | ICD-10-CM

## 2021-07-29 DIAGNOSIS — I10 Essential (primary) hypertension: Secondary | ICD-10-CM

## 2021-07-29 DIAGNOSIS — E7800 Pure hypercholesterolemia, unspecified: Secondary | ICD-10-CM | POA: Diagnosis not present

## 2021-07-29 DIAGNOSIS — R109 Unspecified abdominal pain: Secondary | ICD-10-CM

## 2021-07-29 MED ORDER — FLECAINIDE ACETATE 50 MG PO TABS: 150.0000 mg | ORAL_TABLET | Freq: Once | ORAL | 0 refills | Status: DC | PRN

## 2021-07-29 MED ORDER — RIVAROXABAN 15 MG PO TABS: 15.0000 mg | ORAL_TABLET | Freq: Every day | ORAL | 1 refills | Status: DC

## 2021-07-29 NOTE — Progress Notes (Signed)
Primary Physician/Referring:  Shelda Pal, DO  Patient ID: Brandon Bradford, male    DOB: 06-05-1940, 81 y.o.   MRN: [id number]  Chief Complaint  Patient presents with   Atrial Fibrillation   Hospitalization Follow-up    1 WEEK   HPI:    Brandon Bradford  is a 81 y.o. Asian male with Patient with past medical history of hypertension, A. FIb in 2011 converted to sinus on Flecainide with no recurrence and not on anticoagulation (established with me 2015), hyperglycemia, hypertension, mild hyperlipidemia and moderate sized AAA and left common iliac artery involvement and extensive atherosclerotic changes of the abdominal aorta.  He has a moderate-sized abdominal aortic aneurysm noted in 2016 has relatively remained stable.  He presented to the emergency room on 07/24/2021 with dizziness and lightheadedness ongoing for the past 4 days.  No other symptoms, he was found to have new onset atrial fibrillation with controlled ventricular response.  He was discharged home on Xarelto and flecainide and advised outpatient follow-up.  Patient did not feel well after taking flecainide hence discontinued this and also Xarelto after using it for 2 days.  States that he now feels well and is back to his baseline.  Past Medical History:  Diagnosis Date   AAA (abdominal aortic aneurysm) (HCC)    3.4 cm   Cataract 06/2016   bilateral cataract extraction   Colitis, ischemic (HCC)    Gastritis    GERD (gastroesophageal reflux disease)    Hx of colonic polyps    Hyperlipidemia    Hypertension    Paroxysmal atrial fibrillation (HCC)    Vertigo, intermittent    Past Surgical History:  Procedure Laterality Date   APPENDECTOMY  1971   CATARACT EXTRACTION, BILATERAL     HEMICOLECTOMY     Right   HEMORROIDECTOMY  2002   Family History  Problem Relation Age of Onset   Hypertension Mother    Colon cancer Neg Hx     Social History   Tobacco Use   Smoking status: Former    Types: Cigarettes    Quit  date: 10/13/1995    Years since quitting: 25.8   Smokeless tobacco: Never  Substance Use Topics   Alcohol use: No    Alcohol/week: 0.0 standard drinks of alcohol   Marital Status: Married  ROS  Review of Systems  Cardiovascular:  Negative for chest pain, dyspnea on exertion and leg swelling.  Gastrointestinal:  Negative for melena.   Objective  Blood pressure 112/67, pulse 68, temperature 97.9 F (36.6 C), temperature source Temporal, resp. rate 17, height 5' 7"  (1.702 m), weight 180 lb 6.4 oz (81.8 kg), SpO2 94 %.     07/29/2021    3:19 PM 07/24/2021    5:26 PM 07/24/2021    5:15 PM  Vitals with BMI  Height 5' 7"     Weight 180 lbs 6 oz    BMI 33.29    Systolic 518 841 660  Diastolic 67 78 69  Pulse 68 61 50     Physical Exam Neck:     Vascular: No JVD.  Cardiovascular:     Rate and Rhythm: Normal rate and regular rhythm.     Pulses: Intact distal pulses.     Heart sounds: Normal heart sounds. No murmur heard.    No gallop.  Pulmonary:     Effort: Pulmonary effort is normal.     Breath sounds: Normal breath sounds.  Abdominal:     General: Bowel sounds are  normal.     Palpations: Abdomen is soft.  Musculoskeletal:     Right lower leg: No edema.     Left lower leg: No edema.    Laboratory examination:   Recent Labs    01/15/21 0745 06/03/21 0715 07/24/21 1601  NA 141 139 139  K 4.7 4.8 4.1  CL 105 103 108  CO2 29 27 25   GLUCOSE 114* 110* 114*  BUN 25* 30* 25*  CREATININE 1.29 1.46 1.55*  CALCIUM 9.7 10.2 9.5  GFRNONAA  --   --  45*   estimated creatinine clearance is 38.9 mL/min (A) (by C-G formula based on SCr of 1.55 mg/dL (H)).     Latest Ref Rng & Units 07/24/2021    4:01 PM 06/03/2021    7:15 AM 01/15/2021    7:45 AM  CMP  Glucose 70 - 99 mg/dL 114  110  114   BUN 8 - 23 mg/dL 25  30  25    Creatinine 0.61 - 1.24 mg/dL 1.55  1.46  1.29   Sodium 135 - 145 mmol/L 139  139  141   Potassium 3.5 - 5.1 mmol/L 4.1  4.8  4.7   Chloride 98 - 111 mmol/L  108  103  105   CO2 22 - 32 mmol/L 25  27  29    Calcium 8.9 - 10.3 mg/dL 9.5  10.2  9.7   Total Protein 6.0 - 8.3 g/dL  7.5  7.1   Total Bilirubin 0.2 - 1.2 mg/dL  1.7  1.0   Alkaline Phos 39 - 117 U/L  57  59   AST 0 - 37 U/L  27  20   ALT 0 - 53 U/L  22  17       Latest Ref Rng & Units 07/24/2021    4:01 PM 01/15/2021    7:45 AM 07/29/2019    5:50 PM  CBC  WBC 4.0 - 10.5 K/uL 7.1  5.6  7.9   Hemoglobin 13.0 - 17.0 g/dL 14.5  14.6  15.2   Hematocrit 39.0 - 52.0 % 42.4  43.3  45.4   Platelets 150 - 400 K/uL 223  213.0  216     Lipid Panel Recent Labs    11/26/20 0745 06/03/21 0715  CHOL 161 164  TRIG 164.0* 111.0  LDLCALC 75 77  VLDL 32.8 22.2  HDL 53.10 64.30  CHOLHDL 3 3    HEMOGLOBIN A1C Lab Results  Component Value Date   HGBA1C 6.0 06/03/2021   MPG 123 05/13/2020   TSH Recent Labs    01/15/21 0745 07/24/21 1601  TSH 4.38 2.440    Medications and allergies  No Known Allergies    Current Outpatient Medications:    atorvastatin (LIPITOR) 20 MG tablet, TAKE 1 TABLET BY MOUTH ONCE  DAILY, Disp: 90 tablet, Rfl: 3   B Complex-C (SUPER B COMPLEX PO), Take 1 tablet by mouth daily., Disp: , Rfl:    betamethasone valerate (VALISONE) 0.1 % cream, Apply 1 application topically as needed., Disp: , Rfl:    Cholecalciferol (VITAMIN D3) 5000 units CAPS, Take 1 capsule by mouth daily., Disp: , Rfl:    colchicine 0.6 MG tablet, Take 1 tablet (0.6 mg total) by mouth daily. (Patient taking differently: Take 0.6 mg by mouth as needed.), Disp: 90 tablet, Rfl: 3   esomeprazole (NEXIUM) 20 MG capsule, Take 20 mg by mouth as needed. , Disp: , Rfl:    losartan (COZAAR) 25 MG tablet, Take  1 tablet (25 mg total) by mouth daily., Disp: 90 tablet, Rfl: 3   Magnesium 400 MG CAPS, Take 1 tablet by mouth daily., Disp: , Rfl:    metoprolol succinate (TOPROL-XL) 50 MG 24 hr tablet, Take 1 tablet (50 mg total) by mouth daily. Take with or immediately following a meal., Disp: 90 tablet, Rfl:  3   Multiple Vitamin (MULTIVITAMIN) tablet, Take 1 tablet by mouth daily., Disp: , Rfl:    Probiotic Product (PROBIOTIC-10 ULTIMATE) CAPS, Take 1 capsule by mouth., Disp: , Rfl:    flecainide (TAMBOCOR) 50 MG tablet, Take 3 tablets (150 mg total) by mouth once as needed for up to 1 dose., Disp: 60 tablet, Rfl: 0   Pancrelipase, Lip-Prot-Amyl, 24000-76000 units CPEP, Take 1 capsule (24,000 Units total) by mouth 3 (three) times daily. 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  MRI of the abdomen 05/01/2017:  Partially visualized saccular infrarenal abdominal aortic aneurysm measuring at least 4.5 cm.   Chest x-ray PA and lateral view 07/29/2019: The heart size and mediastinal contours are within normal limits. Both lungs are clear. The visualized skeletal structures are unremarkable.  Cardiac Studies:   Exercise Myoview stress test 02/03/2013: 1. Resting EKG showed normal sinus rhythm, LAD, LAFB, IRBBB. Stress EKG was negative for ischemia. Patient exercised on BRUCE PROTOCOL for 9 minutes 6 seconds. The maximum work level achieved was 9.7 MET's. The test was terminated due to fatigue and achievement of the target heart rate. 2. Perfusion imaging study demonstrates normal perfusion without ischemia or scar. Normal left ventricular ejection fraction. Based on the stress results, continued primary prevention is recommended.  Exercise Treadmill Stress Test 03/07/2018:  Indication: Chest pain   The patient exercised  on Bruce protocol for 7:29 min. Patient achieved 9.34  METS and reached HR  128 bpm, which is   89% of maximum age-predicted HR.  Stress test terminated due to fatigue.   Exercise capacity was normal for age. HR Response to Exercise: Appropriate. BP Response to Exercise:  Resting hypertesion with normal exercise response.  Chest Pain: none. Arrhythmias: none. Resting EKG demonstrates Normal sinus rhythm with left anterior fascicular block. ST Changes: With peak exercise there was no ST-T changes of ischemia.   Overall Impression: Normal stress test. Recommendations: Continue primary/secondary prevention.  PCV ECHOCARDIOGRAM COMPLETE 09/19/2020  Left ventricle cavity is normal in size and wall thickness. Normal global wall motion. Normal LV systolic function with EF 65%. Doppler evidence of grade I (impaired) diastolic dysfunction, normal LAP. Trileaflet aortic valve. No evidence of aortic stenosis. Mild (Grade I) aortic regurgitation. Mild aortic valve leaflet calcification. Mild (Grade I) mitral regurgitation. Moderate tricuspid regurgitation. Estimated pulmonary artery systolic pressure 29 mmHg. Mild pulmonic regurgitation. No significant change compared to previous study on 08/03/2019.  PCV Abdominal Aortic Duplex 09/17/2020: The maximum aorta (sac) diameter is 4.69 cm (mid). Moderate dilatation of the abdominal aorta is noted in the mid and distal aorta.  An abdominal aortic aneurysm measuring 4.69 x 3.95 x 3.92 cm is seen.  Normal iliac artery velocity  bilaterally.  No significant change since 08/18/2019 and 03/17/2018. Recheck in one year.  External Abdominal aortic duplex 06/13/2021: A 3.4 cm aneurysm is noted in the infrarenal abdominal aorta.  Bilateral common iliac artery measurements are in the upper limit of normal.  Follow-up studies in 3 years for surveillance.  EKG  EKG 07/29/2021: Normal sinus rhythm at a rate of 62 bpm, left renal enlargement, left axis deviation, left  anterior fascicular block.  Incomplete right bundle branch block.  No evidence of ischemia.  Normal U waves are evident.  EKG 07/24/2021: Atrial fibrillation with controlled ventricular response at the rate of 68 bpm, normal axis, poor R progression, cannot exclude anteroseptal infarct old.  Normal QT interval.  No evidence of ischemia.  Compared to 11/27/2020, normal sinus rhythm, left axis deviation and left anterior fascicular block.   Assessment     ICD-10-CM   1. Paroxysmal atrial fibrillation (HCC)  I48.0 EKG 12-Lead    Rivaroxaban (XARELTO) 15 MG TABS tablet    flecainide (TAMBOCOR) 50 MG tablet    2. Abdominal aortic aneurysm (AAA) >39 mm diameter (HCC)  I71.40     3. Primary hypertension  I10     4. Hypercholesteremia  E78.00       CHA2DS2-VASc Score is 4.  Yearly risk of stroke: 4.8% (A, HTN, Aortic atherosclerosis).  Score of 1=0.6; 2=2.2; 3=3.2; 4=4.8; 5=7.2; 6=9.8; 7=>9.8) -(CHF; HTN; vasc disease DM,  Male = 1; Age <65 =0; 65-74 = 1,  >75 =2; stroke/embolism= 2).    Medications Discontinued During This Encounter  Medication Reason   flecainide (TAMBOCOR) 50 MG tablet Patient Preference   Rivaroxaban (XARELTO) 15 MG TABS tablet Reorder    Recommendations:   Brandon Bradford  is a 81 y.o. Asian male with Patient with past medical history of hypertension, A. FIb in 2011 converted to sinus on Flecainide with no recurrence and not on anticoagulation (established with me 2015), hyperglycemia, hypertension, mild hyperlipidemia and moderate sized AAA and left common iliac artery involvement and extensive atherosclerotic changes of the abdominal aorta.  He has a moderate-sized abdominal aortic aneurysm noted in 2016 has relatively remained stable.  He presented to the emergency room on 07/24/2021 with dizziness and lightheadedness ongoing for the past 4 days.  No other symptoms, he was found to have new onset atrial fibrillation with controlled ventricular response.  He was discharged home on  Xarelto and flecainide and advised outpatient follow-up.  Patient did not feel well after taking flecainide hence discontinued this and also Xarelto after using it for 2 days.  States that he now feels well and is back to his baseline.  Patient is back in sinus rhythm.  So far he has had 2 episodes of atrial fibrillation in 2011 and again in 2023.  His chads vascular score is 4.0, in view of high cardioembolic risk, we had a long discussion regarding continued anticoagulation versus watchful waiting.  As his symptoms of dizziness and lightheadedness were ongoing for 4 days I presume that his atrial fibrillation lasted at least that long hence he should be on long-term anticoagulation.  I have restarted him back on Xarelto.  He did not feel well taking flecainide.  As his A-fib burden is very low, and he is back into sinus rhythm, we discussed regarding pill in the pocket approach by taking 3 tablets of 50 mg as needed and is willing to do this.  I would like to see him back in 3 months for follow-up of arrhythmia.  With regard to aortic aneurysm, patient has known abdominal Starting in 2015 measuring around 4.2 cm or so and has remained relatively stable.  He is due for repeat aortic duplex on annual basis, outside abdominal aortic duplex had erroneously measured it has only 3 cm.  I will repeat aortic duplex prior to his next office visit.   With regard to hyperlipidemia, he has significant amount of atherosclerotic burden in the abdominal aorta.  LDL is at goal and he is tolerating statins.  Blood pressure is well controlled.  All questions answered, his wife is present.  A 40-minute office visit encounter.   Adrian Prows, MD, [facility] 07/29/2021, [place] PM Office: [phone]

## 2021-08-06 ENCOUNTER — Ambulatory Visit (HOSPITAL_BASED_OUTPATIENT_CLINIC_OR_DEPARTMENT_OTHER)
Admission: RE | Admit: 2021-08-06 | Discharge: 2021-08-06 | Disposition: A | Discharge status: Home / Self Care | Payer: Medicare Other | Source: Ambulatory Visit | Attending: Nephrology | Admitting: Nephrology

## 2021-08-06 ENCOUNTER — Telehealth: Payer: Self-pay | Admitting: *Deleted

## 2021-08-06 ENCOUNTER — Ambulatory Visit (INDEPENDENT_AMBULATORY_CARE_PROVIDER_SITE_OTHER): Payer: Medicare Other | Admitting: Nurse Practitioner

## 2021-08-06 ENCOUNTER — Encounter: Payer: Self-pay | Admitting: Nurse Practitioner

## 2021-08-06 VITALS — BP 126/70 | HR 95 | Ht 67.0 in | Wt 173.0 lb

## 2021-08-06 DIAGNOSIS — D7389 Other diseases of spleen: Secondary | ICD-10-CM | POA: Diagnosis not present

## 2021-08-06 DIAGNOSIS — I714 Abdominal aortic aneurysm, without rupture, unspecified: Secondary | ICD-10-CM | POA: Diagnosis not present

## 2021-08-06 DIAGNOSIS — K8020 Calculus of gallbladder without cholecystitis without obstruction: Secondary | ICD-10-CM | POA: Diagnosis not present

## 2021-08-06 DIAGNOSIS — R1013 Epigastric pain: Secondary | ICD-10-CM | POA: Diagnosis not present

## 2021-08-06 DIAGNOSIS — R109 Unspecified abdominal pain: Secondary | ICD-10-CM | POA: Diagnosis not present

## 2021-08-06 DIAGNOSIS — N281 Cyst of kidney, acquired: Secondary | ICD-10-CM | POA: Diagnosis not present

## 2021-08-06 DIAGNOSIS — R634 Abnormal weight loss: Secondary | ICD-10-CM

## 2021-08-06 NOTE — Telephone Encounter (Signed)
   Brandon Bradford 1940-11-25 [id number]  Dear Dr. Einar Gip:  We have scheduled the above named patient for a(n) EGD procedure. Our records show that (s)he is on anticoagulation therapy.  Please advise as to whether the patient may come off their therapy of Xarelto 2 days prior to their procedure which is scheduled for Monday 08/11/21.  Please route your response to Caryl Asp, [place] or fax response to [phone].  Sincerely,   Caryl Asp, [facility]

## 2021-08-06 NOTE — Patient Instructions (Signed)
You have been scheduled for an endoscopy. Please follow written instructions given to you at your visit today. If you use inhalers (even only as needed), please bring them with you on the day of your procedure.  If you are age 81 or older, your body mass index should be between 23-30. Your Body mass index is 27.1 kg/m. If this is out of the aforementioned range listed, please consider follow up with your Primary Care Provider.  If you are age 76 or younger, your body mass index should be between 19-25. Your Body mass index is 27.1 kg/m. If this is out of the aformentioned range listed, please consider follow up with your Primary Care Provider.   ________________________________________________________  The [facility] providers would like to encourage you to use [facility] to communicate with providers for non-urgent requests or questions.  Due to long hold times on the telephone, sending your provider a message by [facility] may be a faster and more efficient way to get a response.  Please allow 48 business hours for a response.  Please remember that this is for non-urgent requests.  _______________________________________________________

## 2021-08-06 NOTE — Telephone Encounter (Signed)
Can hold Xarelto for 48 hours prior to colonoscopy, restart same day if no biopsy otherwise start in 3 to 5 days.  Call if questions.   Adrian Prows, MD, [facility] 08/06/2021, 8:56 PM Office: [phone] Fax: [phone] Pager: [phone]

## 2021-08-06 NOTE — Progress Notes (Signed)
Chief Complaint:  epigastric pain   Assessment &  Plan   # 81 yo male with worsening of chronic dyspepsia. He has a remote history of duodenal ulcers in 2010 and gastritis on more recent EGDs ( last one in 2016). He continues on daily Nexium. He take pancreatic enzymes ( taken for years?).  Over the years he has had an extensive workup with [place], HIDA, UGI w/ SBFT, gastric emptying scan and repeat EGDs.  He is now as he has been previously concerned about cancer.  I tried to reassure him and his wife that his symptoms have been present for many years and that gastric cancer seems unlikely. His worsening pain and mild weight loss has him very concerned and he wants further testing.  I think repeat EGD will be low yield but pending discussion with Dr. Ardis Hughs I will go ahead and arrange for one. It has been nearly 7 years since his last one.     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 He is up to date on surveillance colonoscopy ( done in Jan 2020).  At this point he may not require another based on his age   HPI   Brandon Bradford is an 81 yo male with a PMH of tubulovillous colon polyps, right hemicolectomy, PUD, chronic dyspepsia, HTN, AAA. See PMH for additional history.   Patient is known to Dr. Ardis Hughs.  He has a history of adenomatous colon polyps. Reviewing our 03/16/11 office note, in 2011 patient had a right hemicolectomy for an unresectable polyp. 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 Several nodes were removed,  none with cancer or neoplasia.   He has what sounds like a post-op SBO treated at [facility]. At the time of his 03/16/11 visit with Dr. Ardis Hughs patient was complaining of mild dyspeptic symptoms.  He underwent an EGD with findings of gastritis.   Chronic dyspepsia:  Brandon Bradford was seen here again in May 2016 for same epigastric pain. EGD with biopsies showed reflux  esophagitis and mild to moderate gastritis.  No H. Pylori.   Patient seen here again in Feb 2018 for epigastric pain.  He had seen surgery the week prior.  An upper GI series with small bowel follow-through and an ultrasound were both normal.  Dr. Ardis Hughs ordered a HIDA which was normal.   September 2018 patient saw [facility] in their office for early satiety,  abdominal pain.  Gastric emptying study was ordered and normal.    December 2019 patient returned to Dr. Ardis Hughs. He and wife were asking about a repeat upper endoscopy which we advised against since he  had had these issues for nearly 10 years without a clear diagnosis and he had no associated alarm symptoms at the time.  We did repeat his polyp surveillance colonoscopy in January 2020 and he hasn't been seen here since.   Interval history:  Brandon Bradford has returned with worsening epigastric pain. There is a language barrier, wife tries to assist.  He takes Nexium every morning on an empty stomach.  He is on chronic pancreatic enzymes . He isn't taking any NSAIDS. No blood in stool or black stool.  It sounds like his epigastric pain is nonradiating.  The pain is slightly better on an empty stomach as it definitely gets worse with eating.  Over the last month the pain has gotten  almost unbearable, worse than it has ever been.  Just as prior office notes have indicated, patient is concerned that he has some sort of an intestinal blockage resulting from his right hemicolectomy years ago.  He feels like food just sits in his stomach. He is also very concerned that he may have stomach cancer.  He is eating very little and has lost 6 or 7 pounds over the last 6 months.  He doesn't have nausea / vomiting. Not constipated but not having many BMs because he isn't eating much.       Previous GI Evaluation   Feb 2013 EGD - for dyspepsia and history of multiple duodenal ulcers on EGD in 2010 in [place].  - Moderate gastritis.   Oct 2014 Surveillance  Colonoscopy  --Ileocolonic anastomosis was normal-appearing and the examination was otherwise normal  June 2016 EGD for dyspepsia --Mild to moderate distal gastritis.  Irregular Z-line with patchy nodularity.  Surgical [P], distal stomach - MILD CHRONIC INACTIVE GASTRITIS. - THERE IS NO EVIDENCE OF HELICOBACTER PYLORI, DYSPLASIA, OR MALIGNANCY. - SEE COMMENT. 2. Surgical [P], GE junction - GASTROESOPHAGEAL JUNCTION MUCOSA WITH MILD INFLAMMATION CONSISTENT WITH GASTROESOPHAGEAL REFLUX. - THERE IS NO EVIDENCE OF GOBLET CELL METAPLASIA, DYSPLASIA, OR MALIGNANCY.  2018 gastric emptying scan  --normal  2018 Ultrasound -Diffuse steatosis.   2019 HIDA  --Normal. EF 44%   Jan 2020 surveillance colonoscopy Normal appearing right sided ileocolonic anastomosis. - One 1 mm polyp in the transverse colon, removed with a cold biopsy forceps. Resected and retrieved. - External and internal hemorrhoids. - The examination was otherwise normal on direct and retroflexion views. Path - polypoid mucosa  Labs:     Latest Ref Rng & Units 07/24/2021    4:01 PM 01/15/2021    7:45 AM 07/29/2019    5:50 PM  CBC  WBC 4.0 - 10.5 K/uL 7.1  5.6  7.9   Hemoglobin 13.0 - 17.0 g/dL 14.5  14.6  15.2   Hematocrit 39.0 - 52.0 % 42.4  43.3  45.4   Platelets 150 - 400 K/uL 223  213.0  216        Latest Ref Rng & Units 06/03/2021    7:15 AM 01/15/2021    7:45 AM 11/26/2020    7:45 AM  Hepatic Function  Total Protein 6.0 - 8.3 g/dL 7.5  7.1  7.6   Albumin 3.5 - 5.2 g/dL 4.7  4.5  4.8   AST 0 - 37 U/L _0 ALT 0 - 53 U/L _1 Alk Phosphatase 39 - 117 U/L 57  59  60   Total Bilirubin 0.2 - 1.2 mg/dL 1.7  1.0  1.6      Past Medical History:  Diagnosis Date   AAA (abdominal aortic aneurysm) (HCC)    3.4 cm   Cataract 06/2016   bilateral cataract extraction   Colitis, ischemic (HCC)    Gastritis    GERD (gastroesophageal reflux disease)    Hx of colonic polyps    Hyperlipidemia     Hypertension    Paroxysmal atrial fibrillation (HCC)    Vertigo, intermittent     Past Surgical History:  Procedure Laterality Date   APPENDECTOMY  1971   CATARACT EXTRACTION, BILATERAL     HEMICOLECTOMY     Right   HEMORROIDECTOMY  2002    Current Medications, Allergies, Family History and Social History were reviewed in [employer] record.  Current Outpatient Medications  Medication Sig Dispense Refill   atorvastatin (LIPITOR) 20 MG tablet TAKE 1 TABLET BY MOUTH ONCE  DAILY 90 tablet 3   B Complex-C (SUPER B COMPLEX PO) Take 1 tablet by mouth daily.     betamethasone valerate (VALISONE) 0.1 % cream Apply 1 application topically as needed.     Cholecalciferol (VITAMIN D3) 5000 units CAPS Take 1 capsule by mouth daily.     colchicine 0.6 MG tablet Take 1 tablet (0.6 mg total) by mouth daily. (Patient taking differently: Take 0.6 mg by mouth as needed.) 90 tablet 3   esomeprazole (NEXIUM) 20 MG capsule Take 20 mg by mouth as needed.      flecainide (TAMBOCOR) 50 MG tablet Take 3 tablets (150 mg total) by mouth once as needed for up to 1 dose. 60 tablet 0   losartan (COZAAR) 25 MG tablet Take 1 tablet (25 mg total) by mouth daily. 90 tablet 3   Magnesium 400 MG CAPS Take 1 tablet by mouth daily.     metoprolol succinate (TOPROL-XL) 50 MG 24 hr tablet Take 1 tablet (50 mg total) by mouth daily. Take with or immediately following a meal. 90 tablet 3   Multiple Vitamin (MULTIVITAMIN) tablet Take 1 tablet by mouth daily.     Pancrelipase, Lip-Prot-Amyl, 24000-76000 units CPEP Take 1 capsule (24,000 Units total) by mouth 3 (three) times daily. (Patient taking differently: Take 1 capsule by mouth as needed.) 270 capsule 3   Probiotic Product (PROBIOTIC-10 ULTIMATE) CAPS Take 1 capsule by mouth.     Rivaroxaban (XARELTO) 15 MG TABS tablet Take 1 tablet (15 mg total) by mouth daily with supper. 90 tablet 1   No current facility-administered medications for this  visit.    Review of Systems: No chest pain. No shortness of breath. No urinary complaints.    Physical Exam  Wt Readings from Last 3 Encounters:  08/06/21 173 lb (78.5 kg)  07/29/21 180 lb 6.4 oz (81.8 kg)  06/06/21 179 lb 6 oz (81.4 kg)    BP 126/70   Pulse 95   Ht _0  (1.702 m)   Wt 173 lb (78.5 kg)   SpO2 92%   BMI 27.10 kg/m  Constitutional:  Thin but generally well appearing male in no acute distress. Psychiatric: Pleasant. Normal mood and affect. Behavior is normal. EENT: Pupils normal.  Conjunctivae are normal. No scleral icterus. Neck supple.  Cardiovascular: Normal rate, regular rhythm. No edema Pulmonary/chest: Effort normal and breath sounds normal. No wheezing, rales or rhonchi. Abdominal: Soft, nondistended, nontender. Bowel sounds active throughout. There are no masses palpable. No hepatomegaly. Neurological: Alert and oriented to person place and time. Skin: Skin is warm and dry. No rashes noted.  Tye Savoy, NP  08/06/2021, 2:45 PM

## 2021-08-07 NOTE — Telephone Encounter (Signed)
Left message for patient to call office.  

## 2021-08-07 NOTE — Telephone Encounter (Signed)
Patient's wife informed patient can hold Xarelto.

## 2021-08-08 NOTE — Progress Notes (Signed)
He is intermittently fixated on rather mild dyspeptic symptoms.  He has had exhaustive work-up in the past.  I think it would probably be most cost effective to go ahead and repeat an upper endoscopy for him since it has been 7 years since his last 1.  Hopefully if this is normal we can avoid further testing.

## 2021-08-11 ENCOUNTER — Encounter: Payer: Self-pay | Admitting: Gastroenterology

## 2021-08-11 ENCOUNTER — Ambulatory Visit (AMBULATORY_SURGERY_CENTER): Payer: Medicare Other | Admitting: Gastroenterology

## 2021-08-11 VITALS — BP 118/74 | HR 52 | Temp 97.8°F | Resp 14 | Ht 67.0 in | Wt 173.0 lb

## 2021-08-11 DIAGNOSIS — R634 Abnormal weight loss: Secondary | ICD-10-CM | POA: Diagnosis not present

## 2021-08-11 DIAGNOSIS — I4891 Unspecified atrial fibrillation: Secondary | ICD-10-CM | POA: Diagnosis not present

## 2021-08-11 DIAGNOSIS — K2950 Unspecified chronic gastritis without bleeding: Secondary | ICD-10-CM | POA: Diagnosis not present

## 2021-08-11 DIAGNOSIS — K2970 Gastritis, unspecified, without bleeding: Secondary | ICD-10-CM | POA: Diagnosis not present

## 2021-08-11 DIAGNOSIS — R1013 Epigastric pain: Secondary | ICD-10-CM | POA: Diagnosis not present

## 2021-08-11 MED ORDER — SODIUM CHLORIDE 0.9 % IV SOLN: 500.0000 mL | Freq: Once | INTRAVENOUS | Status: DC

## 2021-08-11 NOTE — Progress Notes (Signed)
Report given to PACU, vss 

## 2021-08-11 NOTE — Progress Notes (Signed)
  The recent H&P (dated last week) was reviewed, the patient was examined and there is no change in the patients condition since that H&P was completed.   Chronic dyspepsia EGD 07/2008: Dr. Dorrene German in [place]; done for eipigastric discomfort, fullness after eating; "multiple duodenal ulcers and erosions" and "patchy erythema" and "mild esophagitis".  Biopsies showed no h. Pylori.  He was advsided to avoid nsaids and start ppi.  Repeat EGD 03/2011 Dr. Ardis Hughs for persistent dyspepsia; moderate gastritis, otherwise normal. 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 05/2014 CT scan for right sided abd pains; 76m gallstone. Repeat EGD 06/2014 "moderate to severe distal gastritis" path no H. Pylori.     [name] 08/11/2021, 8:58 AM

## 2021-08-11 NOTE — Patient Instructions (Addendum)
RESUME TAKING YOUR BLOOD THINNER MEDICATION (XARELTO) TODAY AT YOUR USUAL DOSE!  YOU HAD AN ENDOSCOPIC PROCEDURE TODAY AT [facility]:   Refer to the procedure report that was given to you for any specific questions about what was found during the examination.  If the procedure report does not answer your questions, please call your gastroenterologist to clarify.  If you requested that your care partner not be given the details of your procedure findings, then the procedure report has been included in a sealed envelope for you to review at your convenience later.  YOU SHOULD EXPECT: Some feelings of bloating in the abdomen. Passage of more gas than usual.  Walking can help get rid of the air that was put into your GI tract during the procedure and reduce the bloating. If you had a lower endoscopy (such as a colonoscopy or flexible sigmoidoscopy) you may notice spotting of blood in your stool or on the toilet paper. If you underwent a bowel prep for your procedure, you may not have a normal bowel movement for a few days.  Please Note:  You might notice some irritation and congestion in your nose or some drainage.  This is from the oxygen used during your procedure.  There is no need for concern and it should clear up in a day or so.  SYMPTOMS TO REPORT IMMEDIATELY:  Following upper endoscopy (EGD)  Vomiting of blood or coffee ground material  New chest pain or pain under the shoulder blades  Painful or persistently difficult swallowing  New shortness of breath  Fever of 100F or higher  Black, tarry-looking stools  For urgent or emergent issues, a gastroenterologist can be reached at any hour by calling [phone]. Do not use MyChart messaging for urgent concerns.    DIET:  We do recommend a small meal at first, but then you may proceed to your regular diet.  Drink plenty of fluids but you should avoid alcoholic beverages for 24 hours.  ACTIVITY:  You should plan to take  it easy for the rest of today and you should NOT DRIVE or use heavy machinery until tomorrow (because of the sedation medicines used during the test).    FOLLOW UP: Our staff will call the number listed on your records the next business day following your procedure.  We will call around 7:15- 8:00 am to check on you and address any questions or concerns that you may have regarding the information given to you following your procedure. If we do not reach you, we will leave a message.  If you develop any symptoms (ie: fever, flu-like symptoms, shortness of breath, cough etc.) before then, please call [phone].  If you test positive for Covid 19 in the 2 weeks post procedure, please call and report this information to [place].    If any biopsies were taken you will be contacted by phone or by letter within the next 1-3 weeks.  Please call us at [phone] if you have not heard about the biopsies in 3 weeks.    SIGNATURES/CONFIDENTIALITY: You and/or your care partner have signed paperwork which will be entered into your electronic medical record.  These signatures attest to the fact that that the information above on your After Visit Summary has been reviewed and is understood.  Full responsibility of the confidentiality of this discharge information lies with you and/or your care-partner.

## 2021-08-11 NOTE — Progress Notes (Signed)
0930 Robinul 0.1 mg IV given due large amount of secretions upon assessment.  MD made aware, vss

## 2021-08-11 NOTE — Op Note (Addendum)
[place] Patient Name: Brandon Bradford Procedure Date: 08/11/2021 9:29 AM MRN: [id number] Endoscopist: Milus Banister , MD Age: 81 Referring MD:  Date of Birth: 27-Jan-1940 Gender: Male Account #: [id number] Procedure:                Upper GI endoscopy Indications:              Functional Dyspepsia; Chronic dyspepsia?EGD 07/2008:                            Dr. Normand Sloop [place]; done for eipigastric discomfort,                            fullness after eating; "multiple duodenal ulcers                            and erosions" and "patchy erythema" and "mild                            esophagitis".? Biopsies showed no h. Pylori.? He                            was advsided to avoid nsaids and start ppi. ?Repeat                            EGD 03/2011 Dr. Malcolm Metro persistent dyspepsia;                            moderate gastritis, otherwise normal. Pathology                            atrophic gastritis, no H. Pylori, no dysplasia.                            05/2014 CT scan for right sided abd pains; 68m                            gallstone. Repeat EGD 06/2014 "moderate to severe                            distal gastritis" path no H. Pylori.?                           ? Medicines:                Monitored Anesthesia Care Procedure:                Pre-Anesthesia Assessment:                           - Prior to the procedure, a History and Physical                            was performed, and patient medications and  allergies were reviewed. The patient's tolerance of                            previous anesthesia was also reviewed. The risks                            and benefits of the procedure and the sedation                            options and risks were discussed with the patient.                            All questions were answered, and informed consent                            was obtained. Prior Anticoagulants: The patient has                             taken Xarelto (rivaroxaban), last dose was 2 days                            prior to procedure. ASA Grade Assessment: II - A                            patient with mild systemic disease. After reviewing                            the risks and benefits, the patient was deemed in                            satisfactory condition to undergo the procedure.                           After obtaining informed consent, the endoscope was                            passed under direct vision. Throughout the                            procedure, the patient's blood pressure, pulse, and                            oxygen saturations were monitored continuously. The                            Endoscope was introduced through the mouth, and                            advanced to the duodenal bulb. The upper GI                            endoscopy was accomplished without difficulty. The  patient tolerated the procedure well. Scope In: Scope Out: Findings:                 Biopsies were taken with a cold forceps in the                            gastric antrum for histology.                           The exam was otherwise without abnormality. Complications:            No immediate complications. Estimated blood loss:                            None. Estimated Blood Loss:     Estimated blood loss: none. Impression:               - The examination was otherwise normal.                           - Biopsies were taken with a cold forceps for                            histology in the gastric antrum. Recommendation:           - Patient has a contact number available for                            emergencies. The signs and symptoms of potential                            delayed complications were discussed with the                            patient. Return to normal activities tomorrow.                            Written discharge instructions were provided to the                             patient.                           - Resume previous diet.                           - Continue present medications. You can resume your                            blood thinner today.                           - Await pathology results. Milus Banister, MD 08/11/2021 9:46:54 AM This report has been signed electronically.

## 2021-08-11 NOTE — Progress Notes (Signed)
Called to room to assist during endoscopic procedure.  Patient ID and intended procedure confirmed with present staff. Received instructions for my participation in the procedure from the performing physician.  

## 2021-08-12 ENCOUNTER — Telehealth: Payer: Self-pay | Admitting: *Deleted

## 2021-08-12 NOTE — Telephone Encounter (Signed)
[facility] follow up call attempt.  No answer.  Left message.

## 2021-08-14 ENCOUNTER — Encounter: Payer: Self-pay | Admitting: Gastroenterology

## 2021-09-12 ENCOUNTER — Ambulatory Visit: Payer: Medicare Other

## 2021-09-12 DIAGNOSIS — I714 Abdominal aortic aneurysm, without rupture, unspecified: Secondary | ICD-10-CM

## 2021-09-12 DIAGNOSIS — I7141 Pararenal abdominal aortic aneurysm, without rupture: Secondary | ICD-10-CM | POA: Diagnosis not present

## 2021-09-29 ENCOUNTER — Ambulatory Visit: Payer: Medicare Other | Admitting: Cardiology

## 2021-09-29 ENCOUNTER — Encounter: Payer: Self-pay | Admitting: Cardiology

## 2021-09-29 VITALS — BP 119/64 | HR 69 | Temp 97.6°F | Resp 16 | Ht 67.0 in | Wt 181.4 lb

## 2021-09-29 DIAGNOSIS — I714 Abdominal aortic aneurysm, without rupture, unspecified: Secondary | ICD-10-CM | POA: Diagnosis not present

## 2021-09-29 DIAGNOSIS — I480 Paroxysmal atrial fibrillation: Secondary | ICD-10-CM

## 2021-09-29 DIAGNOSIS — I10 Essential (primary) hypertension: Secondary | ICD-10-CM

## 2021-09-29 NOTE — Progress Notes (Signed)
Primary Physician/Referring:  Shelda Pal, DO  Patient ID: Brandon Bradford, male    DOB: Oct 13, 1940, 81 y.o.   MRN: [id number]  No chief complaint on file.  HPI:    Brandon Bradford  is a 81 y.o. Asian male with hypertension, stage IIIa chronic kidney disease, hyperlipidemia, paroxysmal atrial fibrillation initially diagnosed in 2011 and again on 07/24/2021 when he presented to the emergency room found to be with recurrence of atrial fibrillation with controlled ventricular response.  On his last office visit due to dizziness, I discontinue flecainide and reduce the dose of losartan to 25 mg daily.  He is presently completely asymptomatic without recurrence of dizziness.    Past Medical History:  Diagnosis Date   AAA (abdominal aortic aneurysm) ([place])    3.4 cm   Arthritis    Cataract 06/2016   bilateral cataract extraction   Clotting disorder (HCC)    Colitis, ischemic ([place])    Gastritis    GERD (gastroesophageal reflux disease)    Hx of colonic polyps    Hyperlipidemia    Hypertension    Paroxysmal atrial fibrillation (HCC)    Vertigo, intermittent    Past Surgical History:  Procedure Laterality Date   APPENDECTOMY  1971   CATARACT EXTRACTION, BILATERAL     HEMICOLECTOMY     Right   HEMORROIDECTOMY  2002   Family History  Problem Relation Age of Onset   Hypertension Mother    Colon cancer Neg Hx    Stomach cancer Neg Hx    Esophageal cancer Neg Hx    Colon polyps Neg Hx     Social History   Tobacco Use   Smoking status: Former    Types: Cigarettes    Quit date: 10/13/1995    Years since quitting: 25.9   Smokeless tobacco: Never  Substance Use Topics   Alcohol use: No    Alcohol/week: 0.0 standard drinks of alcohol   Marital Status: Married  ROS  Review of Systems  Cardiovascular:  Negative for chest pain, dyspnea on exertion and leg swelling.  Gastrointestinal:  Negative for melena.   Objective  Blood pressure 119/64, pulse 69, temperature 97.6 F (36.4  C), temperature source Temporal, resp. rate 16, height _0  (1.702 m), weight 181 lb 6.4 oz (82.3 kg), SpO2 95 %.     09/29/2021    2:37 PM 08/11/2021   10:01 AM 08/11/2021    9:51 AM  Vitals with BMI  Height _1     Weight 181 lbs 6 oz    BMI 93.9    Systolic 030 092 330  Diastolic 64 74 68  Pulse 69 52 53     Physical Exam Neck:     Vascular: No JVD.  Cardiovascular:     Rate and Rhythm: Normal rate and regular rhythm.     Pulses: Intact distal pulses.     Heart sounds: Normal heart sounds. No murmur heard.    No gallop.  Pulmonary:     Effort: Pulmonary effort is normal.     Breath sounds: Normal breath sounds.  Abdominal:     General: Bowel sounds are normal.     Palpations: Abdomen is soft.  Musculoskeletal:     Right lower leg: No edema.     Left lower leg: No edema.    Laboratory examination:   Recent Labs    01/15/21 0745 06/03/21 0715 07/24/21 1601  NA 141 139 139  K 4.7 4.8 4.1  CL 105 103  108  CO2 _0 GLUCOSE 114* 110* 114*  BUN 25* 30* 25*  CREATININE 1.29 1.46 1.55*  CALCIUM 9.7 10.2 9.5  GFRNONAA  --   --  45*   CrCl cannot be calculated (Patient's most recent lab result is older than the maximum 21 days allowed.).     Latest Ref Rng & Units 07/24/2021    4:01 PM 06/03/2021    7:15 AM 01/15/2021    7:45 AM  CMP  Glucose 70 - 99 mg/dL 114  110  114   BUN 8 - 23 mg/dL _1 Creatinine 0.61 - 1.24 mg/dL 1.55  1.46  1.29   Sodium 135 - 145 mmol/L 139  139  141   Potassium 3.5 - 5.1 mmol/L 4.1  4.8  4.7   Chloride 98 - 111 mmol/L 108  103  105   CO2 22 - 32 mmol/L _2 Calcium 8.9 - 10.3 mg/dL 9.5  10.2  9.7   Total Protein 6.0 - 8.3 g/dL  7.5  7.1   Total Bilirubin 0.2 - 1.2 mg/dL  1.7  1.0   Alkaline Phos 39 - 117 U/L  57  59   AST 0 - 37 U/L  27  20   ALT 0 - 53 U/L  22  17       Latest Ref Rng & Units 07/24/2021    4:01 PM 01/15/2021    7:45 AM 07/29/2019    5:50 PM  CBC  WBC 4.0 - 10.5 K/uL 7.1  5.6  7.9    Hemoglobin 13.0 - 17.0 g/dL 14.5  14.6  15.2   Hematocrit 39.0 - 52.0 % 42.4  43.3  45.4   Platelets 150 - 400 K/uL 223  213.0  216     Lipid Panel Recent Labs    11/26/20 0745 06/03/21 0715  CHOL 161 164  TRIG 164.0* 111.0  LDLCALC 75 77  VLDL 32.8 22.2  HDL 53.10 64.30  CHOLHDL 3 3    HEMOGLOBIN A1C Lab Results  Component Value Date   HGBA1C 6.0 06/03/2021   MPG 123 05/13/2020   TSH Recent Labs    01/15/21 0745 07/24/21 1601  TSH 4.38 2.440    Medications and allergies  No Known Allergies    Current Outpatient Medications:    atorvastatin (LIPITOR) 20 MG tablet, TAKE 1 TABLET BY MOUTH ONCE  DAILY, Disp: 90 tablet, Rfl: 3   B Complex-C (SUPER B COMPLEX PO), Take 1 tablet by mouth daily., Disp: , Rfl:    betamethasone valerate (VALISONE) 0.1 % cream, Apply 1 application topically as needed., Disp: , Rfl:    Cholecalciferol (VITAMIN D3) 5000 units CAPS, Take 1 capsule by mouth daily., Disp: , Rfl:    colchicine 0.6 MG tablet, Take 1 tablet (0.6 mg total) by mouth daily. (Patient taking differently: Take 0.6 mg by mouth as needed.), Disp: 90 tablet, Rfl: 3   esomeprazole (NEXIUM) 20 MG capsule, Take 20 mg by mouth as needed. , Disp: , Rfl:    flecainide (TAMBOCOR) 50 MG tablet, Take 3 tablets (150 mg total) by mouth once as needed for up to 1 dose., Disp: 60 tablet, Rfl: 0   losartan (COZAAR) 25 MG tablet, Take 1 tablet (25 mg total) by mouth daily., Disp: 90 tablet, Rfl: 3   Magnesium 400 MG CAPS, Take 1 tablet by mouth daily., Disp: , Rfl:    metoprolol succinate (  TOPROL-XL) 50 MG 24 hr tablet, Take 1 tablet (50 mg total) by mouth daily. Take with or immediately following a meal., Disp: 90 tablet, Rfl: 3   Multiple Vitamin (MULTIVITAMIN) tablet, Take 1 tablet by mouth daily., Disp: , Rfl:    Pancrelipase, Lip-Prot-Amyl, 24000-76000 units CPEP, Take 1 capsule (24,000 Units total) by mouth 3 (three) times daily. 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  MRI of the abdomen 05/01/2017:  Partially visualized saccular infrarenal abdominal aortic aneurysm measuring at least 4.5 cm.   Chest x-ray PA and lateral view 07/29/2019: The heart size and mediastinal contours are within normal limits. Both lungs are clear. The visualized skeletal structures are unremarkable.  Cardiac Studies:   Exercise Myoview stress test 02/03/2013: 1. Resting EKG showed normal sinus rhythm, LAD, LAFB, IRBBB. Stress EKG was negative for ischemia. Patient exercised on BRUCE PROTOCOL for 9 minutes 6 seconds. The maximum work level achieved was 9.7 MET's. The test was terminated due to fatigue and achievement of the target heart rate. 2. Perfusion imaging study demonstrates normal perfusion without ischemia or scar. Normal left ventricular ejection fraction. Based on the stress results, continued primary prevention is recommended.  Exercise Treadmill Stress Test 03/07/2018:  Indication: Chest pain   The patient exercised on Bruce protocol for 7:29 min. Patient achieved 9.34  METS and reached HR  128 bpm, which is   89% of maximum age-predicted HR.  Stress test terminated due to fatigue.   Exercise capacity was normal for age. HR Response to Exercise: Appropriate. BP Response to Exercise:  Resting hypertesion with normal exercise response.  Chest Pain: none. Arrhythmias: none. Resting EKG demonstrates Normal sinus  rhythm with left anterior fascicular block. ST Changes: With peak exercise there was no ST-T changes of ischemia.   Overall Impression: Normal stress test. Recommendations: Continue primary/secondary prevention.  PCV ECHOCARDIOGRAM COMPLETE 09/19/2020  Left ventricle cavity is normal in size and wall thickness. Normal global wall motion. Normal LV systolic function with EF 65%. Doppler evidence of grade I (impaired) diastolic dysfunction, normal LAP. Trileaflet aortic valve. No evidence of aortic stenosis. Mild (Grade I) aortic regurgitation. Mild aortic valve leaflet calcification. Mild (Grade I) mitral regurgitation. Moderate tricuspid regurgitation. Estimated pulmonary artery systolic pressure 29 mmHg. Mild pulmonic regurgitation. No significant change compared to previous study on 08/03/2019.  Abdominal Aortic Duplex 09/12/2021: Moderate dilatation of the abdominal aorta is noted in the mid aorta. An abdominal aortic aneurysm measuring 3.7 x 3.6 x 4.2 cm is seen.  Normal iliac artery size and velocity bilateral.  Compared to 09/17/2020, no significant change in the related embolism. Recheck in 1 year.    EKG  EKG 07/29/2021: Normal sinus rhythm at a rate of 62 bpm, left renal enlargement, left axis deviation, left anterior fascicular block.  Incomplete right bundle branch block.  No evidence of ischemia.  Normal U waves are evident.  EKG 07/24/2021: Atrial fibrillation with controlled ventricular response at the rate of  68 bpm, normal axis, poor R progression, cannot exclude anteroseptal infarct old.  Normal QT interval.  No evidence of ischemia.  Compared to 11/27/2020, normal sinus rhythm, left axis deviation and left anterior fascicular block.   Assessment     ICD-10-CM   1. Paroxysmal atrial fibrillation (HCC)  I48.0     2. Abdominal aortic aneurysm (AAA) >39 mm diameter (HCC)  I71.40 PCV AORTA DUPLEX    3. Primary hypertension  I10 CANCELED: EKG 12-Lead      CHA2DS2-VASc Score  is 4.  Yearly risk of stroke: 4.8% (A, HTN, Aortic atherosclerosis).  Score of 1=0.6; 2=2.2; 3=3.2; 4=4.8; 5=7.2; 6=9.8; 7=>9.8) -(CHF; HTN; vasc disease DM,  Male = 1; Age <65 =0; 65-74 = 1,  >75 =2; stroke/embolism= 2).    There are no discontinued medications.   Recommendations:   Dorthy Hustead  is a 81 y.o. Asian male with hypertension, stage IIIa chronic kidney disease, hyperlipidemia, paroxysmal atrial fibrillation initially diagnosed in 2011 and again on 07/24/2021 when he presented to the emergency room found to be with recurrence of atrial fibrillation with controlled ventricular response.  On his last office visit due to dizziness, I discontinue flecainide and reduce the dose of losartan to 25 mg daily.  He is presently completely asymptomatic without recurrence of dizziness.  He would like to continue present medications.  I have discussed with him regarding pill in the pocket approach of flecainide and not to drive or climb stairs if he has taken flecainide as postconversion pauses is a possibility.  I reviewed his AAA duplex.  Relatively stable duplex.  We will repeat duplex in 1 year.  Duplexes relatively remained stable since 2015 measuring at 4.2 cm.  Blood pressure is well controlled.  All questions answered, his wife is present.     Adrian Prows, MD, [facility] 09/29/2021, 3:33 PM Office: [phone]

## 2021-10-24 ENCOUNTER — Other Ambulatory Visit: Payer: Self-pay | Admitting: Family Medicine

## 2021-10-24 DIAGNOSIS — I10 Essential (primary) hypertension: Secondary | ICD-10-CM

## 2021-10-30 ENCOUNTER — Ambulatory Visit: Payer: Medicare Other

## 2021-11-07 ENCOUNTER — Encounter: Payer: Self-pay | Admitting: Family Medicine

## 2021-11-07 ENCOUNTER — Ambulatory Visit (INDEPENDENT_AMBULATORY_CARE_PROVIDER_SITE_OTHER): Payer: Medicare Other

## 2021-11-07 DIAGNOSIS — Z23 Encounter for immunization: Secondary | ICD-10-CM

## 2021-12-01 ENCOUNTER — Other Ambulatory Visit (INDEPENDENT_AMBULATORY_CARE_PROVIDER_SITE_OTHER): Payer: Medicare Other

## 2021-12-01 DIAGNOSIS — E785 Hyperlipidemia, unspecified: Secondary | ICD-10-CM | POA: Diagnosis not present

## 2021-12-01 DIAGNOSIS — R7303 Prediabetes: Secondary | ICD-10-CM

## 2021-12-01 LAB — COMPREHENSIVE METABOLIC PANEL
ALT: 17 U/L (ref 0–53)
AST: 22 U/L (ref 0–37)
Albumin: 4.6 g/dL (ref 3.5–5.2)
Alkaline Phosphatase: 50 U/L (ref 39–117)
BUN: 22 mg/dL (ref 6–23)
CO2: 28 mEq/L (ref 19–32)
Calcium: 9.8 mg/dL (ref 8.4–10.5)
Chloride: 106 mEq/L (ref 96–112)
Creatinine, Ser: 1.33 mg/dL (ref 0.40–1.50)
GFR: 50.35 mL/min — ABNORMAL LOW (ref >60.00)
Glucose, Bld: 105 mg/dL — ABNORMAL HIGH (ref 70–99)
Potassium: 5.1 mEq/L (ref 3.5–5.1)
Sodium: 141 mEq/L (ref 135–145)
Total Bilirubin: 1.1 mg/dL (ref 0.2–1.2)
Total Protein: 7.2 g/dL (ref 6.0–8.3)

## 2021-12-01 LAB — LIPID PANEL
Cholesterol: 162 mg/dL (ref 0–200)
HDL: 55.3 mg/dL (ref >39.00)
LDL Cholesterol: 72 mg/dL (ref 0–99)
NonHDL: 106.98
Total CHOL/HDL Ratio: 3
Triglycerides: 177 mg/dL — ABNORMAL HIGH (ref 0.0–149.0)
VLDL: 35.4 mg/dL (ref 0.0–40.0)

## 2021-12-01 LAB — HEMOGLOBIN A1C: Hgb A1c MFr Bld: 6.2 % (ref 4.6–6.5)

## 2021-12-08 ENCOUNTER — Encounter: Payer: Self-pay | Admitting: Family Medicine

## 2021-12-08 ENCOUNTER — Ambulatory Visit (INDEPENDENT_AMBULATORY_CARE_PROVIDER_SITE_OTHER): Payer: Medicare Other | Admitting: Family Medicine

## 2021-12-08 VITALS — BP 138/82 | HR 83 | Temp 97.0°F | Ht 64.0 in | Wt 180.5 lb

## 2021-12-08 DIAGNOSIS — I10 Essential (primary) hypertension: Secondary | ICD-10-CM

## 2021-12-08 DIAGNOSIS — R7303 Prediabetes: Secondary | ICD-10-CM | POA: Diagnosis not present

## 2021-12-08 DIAGNOSIS — E785 Hyperlipidemia, unspecified: Secondary | ICD-10-CM

## 2021-12-08 DIAGNOSIS — M546 Pain in thoracic spine: Secondary | ICD-10-CM

## 2021-12-08 DIAGNOSIS — G8929 Other chronic pain: Secondary | ICD-10-CM | POA: Diagnosis not present

## 2021-12-08 DIAGNOSIS — M1A9XX0 Chronic gout, unspecified, without tophus (tophi): Secondary | ICD-10-CM | POA: Diagnosis not present

## 2021-12-08 MED ORDER — ATORVASTATIN CALCIUM 20 MG PO TABS: 20.0000 mg | ORAL_TABLET | Freq: Every day | ORAL | 3 refills | Status: DC

## 2021-12-08 MED ORDER — COLCHICINE 0.6 MG PO TABS: 0.6000 mg | ORAL_TABLET | ORAL | 3 refills | Status: DC | PRN

## 2021-12-08 MED ORDER — METOPROLOL SUCCINATE ER 50 MG PO TB24: 50.0000 mg | ORAL_TABLET | Freq: Every day | ORAL | 3 refills | Status: DC

## 2021-12-08 MED ORDER — LOSARTAN POTASSIUM 25 MG PO TABS: 25.0000 mg | ORAL_TABLET | Freq: Every day | ORAL | 3 refills | Status: DC

## 2021-12-08 NOTE — Progress Notes (Signed)
Chief Complaint  Patient presents with   Follow-up    Back pain Refills for one year     Subjective Kee Drudge is a 81 y.o. male who presents for hypertension follow up. He does not monitor home blood pressures. He is compliant with medications- Toprol XL 50 mg/d, losartan 25 mg/d. Patient has these side effects of medication: none He is adhering to a healthy diet overall. Current exercise: golfing No CP or SOB.   Hyperlipidemia Patient presents for dyslipidemia follow up. Currently being treated with Lipitor 20 mg/d and compliance with treatment thus far has been good. He denies myalgias. Diet/exercise as above.  The patient is not known to have coexisting coronary artery disease.   Past Medical History:  Diagnosis Date   AAA (abdominal aortic aneurysm) (HCC)    3.4 cm   Arthritis    Cataract 06/2016   bilateral cataract extraction   Clotting disorder (HCC)    Colitis, ischemic (HCC)    Gastritis    GERD (gastroesophageal reflux disease)    Hx of colonic polyps    Hyperlipidemia    Hypertension    Paroxysmal atrial fibrillation (HCC)    Vertigo, intermittent     Exam BP 138/82 (BP Location: Left Arm, Patient Position: Sitting, Cuff Size: Normal)   Pulse 83   Temp (!) 97 F (36.1 C) (Oral)   Ht '5\' 4"'$  (1.626 m)   Wt 180 lb 8 oz (81.9 kg)   SpO2 98%   BMI 30.98 kg/m  General:  well developed, well nourished, in no apparent distress Heart: RRR, no bruits, no LE edema Lungs: clear to auscultation, no accessory muscle use Psych: well oriented with normal range of affect and appropriate judgment/insight  Essential hypertension - Plan: losartan (COZAAR) 25 MG tablet, metoprolol succinate (TOPROL-XL) 50 MG 24 hr tablet  Hyperlipidemia, unspecified hyperlipidemia type - Plan: atorvastatin (LIPITOR) 20 MG tablet, Comprehensive metabolic panel, Lipid panel  Prediabetes - Plan: Hemoglobin A1c  Chronic gout without tophus, unspecified cause, unspecified site - Plan:  colchicine 0.6 MG tablet, Uric acid  Chronic right-sided thoracic back pain  Chronic, stable. Cont Toprol XL 50 mg/d, losartan 25 mg/d. Counseled on diet and exercise. Chronic, stable. Cont Lipitor 20 mg/d.  Check A1c at next visit. Continue colchicine as needed. Offered physical therapy versus sports medicine referral.  Generic form stretches/exercises were not helpful previously.  As this has not changed, he would like to hold off on doing anything at this time. F/u in 6 mo. The patient voiced understanding and agreement to the plan.  [place], DO 12/08/21  8:52 AM

## 2021-12-08 NOTE — Patient Instructions (Addendum)
Keep the diet clean and stay active.  Let me know if you want to see specialist for your back issue.   Heat (pad or rice pillow in microwave) over affected area, 10-15 minutes twice daily.   Ice/cold pack over area for 10-15 min twice daily.  OK to take Tylenol 1000 mg (2 extra strength tabs) or 975 mg (3 regular strength tabs) every 6 hours as needed.  Ask to switch to Otelia Santee in the [facility] group.   Let us know if you need anything.

## 2021-12-15 ENCOUNTER — Telehealth: Payer: Self-pay | Admitting: Family Medicine

## 2021-12-15 DIAGNOSIS — I10 Essential (primary) hypertension: Secondary | ICD-10-CM

## 2021-12-15 MED ORDER — LOSARTAN POTASSIUM 25 MG PO TABS: 25.0000 mg | ORAL_TABLET | Freq: Every day | ORAL | 3 refills | Status: DC

## 2021-12-15 MED ORDER — METOPROLOL SUCCINATE ER 50 MG PO TB24: 50.0000 mg | ORAL_TABLET | Freq: Every day | ORAL | 3 refills | Status: DC

## 2021-12-15 NOTE — Telephone Encounter (Signed)
Pt's wife came in and stated [facility] has not been able to fill metoprolol succinate (TOPROL-XL) 50 MG 24 hr tablet or losartan (COZAAR) 25 MG tablet due to missing information. Please let pt know once this has been fixed.   [facility] - [place], [place] [facility] [address], [place] [address] Phone: [phone]  Fax: [phone]

## 2021-12-15 NOTE — Telephone Encounter (Signed)
Sent in there were 2 sets of instructions. Removed one set. Both were correct just worded  differently Patients wife informed sent in.

## 2022-03-17 ENCOUNTER — Ambulatory Visit (INDEPENDENT_AMBULATORY_CARE_PROVIDER_SITE_OTHER): Payer: Medicare Other | Admitting: Family Medicine

## 2022-03-17 ENCOUNTER — Encounter: Payer: Self-pay | Admitting: Family Medicine

## 2022-03-17 VITALS — BP 121/74 | HR 67 | Temp 97.5°F | Ht 64.0 in | Wt 181.4 lb

## 2022-03-17 DIAGNOSIS — M545 Low back pain, unspecified: Secondary | ICD-10-CM

## 2022-03-17 MED ORDER — TIZANIDINE HCL 4 MG PO TABS: 4.0000 mg | ORAL_TABLET | Freq: Four times a day (QID) | ORAL | 0 refills | Status: DC | PRN

## 2022-03-17 NOTE — Patient Instructions (Addendum)
Try to drink 55-60 oz of water daily outside of exercise.  Do your stretches. Send me a message if you need physical therapy.  Heat (pad or rice pillow in microwave) over affected area, 10-15 minutes twice daily.   Ice/cold pack over area for 10-15 min twice daily.  OK to take Tylenol 1000 mg (2 extra strength tabs) or 975 mg (3 regular strength tabs) every 6 hours as needed.  Take Metamucil or Benefiber daily.  Consider Gas-X or Beano.   Let us know if you need anything.

## 2022-03-17 NOTE — Progress Notes (Signed)
Musculoskeletal Exam  Patient: Brandon Bradford DOB: 1940-10-10  DOS: 03/17/2022  SUBJECTIVE:  Chief Complaint:   Chief Complaint  Patient presents with   Back Pain    Brandon Bradford is a 82 y.o.  male for evaluation and treatment of back pain. Here w spouse who helps w hx.   Onset:  1 day ago.  No inj or change in activity.  Location: lower L Character:  aching  Progression of issue:  worsening over past d Associated symptoms: pain w movement.  Denies bowel/bladder incontinence or weakness Treatment: to date has been rest and ice.   Neurovascular symptoms: no  Past Medical History:  Diagnosis Date   AAA (abdominal aortic aneurysm) (HCC)    3.4 cm   Arthritis    Cataract 06/2016   bilateral cataract extraction   Clotting disorder (HCC)    Colitis, ischemic (HCC)    Gastritis    GERD (gastroesophageal reflux disease)    Hx of colonic polyps    Hyperlipidemia    Hypertension    Paroxysmal atrial fibrillation (HCC)    Vertigo, intermittent     Objective:  VITAL SIGNS: BP 121/74 (BP Location: Left Arm, Patient Position: Sitting, Cuff Size: Normal)   Pulse 67   Temp (!) 97.5 F (36.4 C) (Oral)   Ht '5\' 4"'$  (1.626 m)   Wt 181 lb 6 oz (82.3 kg)   SpO2 96%   BMI 31.13 kg/m  Constitutional: Well formed, well developed. No acute distress. HENT: Normocephalic, atraumatic.  Thorax & Lungs:  No accessory muscle use Musculoskeletal: low back.   Tenderness to palpation: Yes over the lateral erector spinae muscle group on the left Deformity: no Ecchymosis: no Straight leg test: negative for Poor hamstring flexibility b/l. Neurologic: Normal sensory function. No focal deficits noted. DTR's equal and symmetric in LE's. No clonus.  5/5 strength in lower extremities bilaterally Psychiatric: Normal mood. Age appropriate judgment and insight. Alert & oriented x 3.    Assessment:  Acute left-sided low back pain without sciatica - Plan: tiZANidine (ZANAFLEX) 4 MG  tablet  Plan: Exacerbation of chronic issue.  Stretches/exercises, heat, ice, Tylenol, Zanaflex as needed.  Warned about drowsiness in minority of patients associated with this, will take 2 hours before planned bedtime to see if it does cause any drowsiness, if it does he will not take it during the day.  I have offered him physical therapy numerous times which he has historically and continues to decline.  I let him know to tell me if he changes his mind for this. F/u prn. The patient and his spouse voiced understanding and agreement to the plan.   [place], DO 03/17/22  4:46 PM

## 2022-03-18 NOTE — Telephone Encounter (Signed)
Opened in error

## 2022-03-23 ENCOUNTER — Telehealth: Payer: Self-pay | Admitting: Family Medicine

## 2022-03-23 DIAGNOSIS — R7301 Impaired fasting glucose: Secondary | ICD-10-CM | POA: Diagnosis not present

## 2022-03-23 DIAGNOSIS — R1013 Epigastric pain: Secondary | ICD-10-CM | POA: Diagnosis not present

## 2022-03-23 DIAGNOSIS — M545 Low back pain, unspecified: Secondary | ICD-10-CM

## 2022-03-23 DIAGNOSIS — Z1330 Encounter for screening examination for mental health and behavioral disorders, unspecified: Secondary | ICD-10-CM | POA: Diagnosis not present

## 2022-03-23 NOTE — Telephone Encounter (Signed)
Patients wife informed has been ordered

## 2022-03-23 NOTE — Telephone Encounter (Signed)
Patients wife came in the office. Patient is in a lot of pain Clothes hurt due to pain Muscle relaxer only put to sleep--not taking meds. Wife would imaging ordered//such as CT or MRI  She states they pay a high dollar for insurance so should be able to get good imaging to see what the problem is. Wife willing to wait

## 2022-03-26 ENCOUNTER — Telehealth (HOSPITAL_BASED_OUTPATIENT_CLINIC_OR_DEPARTMENT_OTHER): Payer: Self-pay

## 2022-03-30 ENCOUNTER — Encounter: Payer: Self-pay | Admitting: Family Medicine

## 2022-03-30 ENCOUNTER — Ambulatory Visit (INDEPENDENT_AMBULATORY_CARE_PROVIDER_SITE_OTHER): Payer: Medicare Other | Admitting: Family Medicine

## 2022-03-30 VITALS — BP 122/68 | HR 65 | Temp 97.6°F | Ht 64.0 in | Wt 179.5 lb

## 2022-03-30 DIAGNOSIS — R195 Other fecal abnormalities: Secondary | ICD-10-CM

## 2022-03-30 DIAGNOSIS — R1032 Left lower quadrant pain: Secondary | ICD-10-CM

## 2022-03-30 NOTE — Patient Instructions (Addendum)
Ok to cancel MRI. We are getting a different scan.   Heat (pad or rice pillow in microwave) over affected area, 10-15 minutes twice daily.   OK to take Tylenol 1000 mg (2 extra strength tabs) or 975 mg (3 regular strength tabs) every 6 hours as needed.  Let us know if you need anything.

## 2022-03-30 NOTE — Progress Notes (Signed)
Musculoskeletal Exam  Patient: Brandon Bradford DOB: 30-Jul-1940  DOS: 03/30/2022  SUBJECTIVE:  Chief Complaint:   Chief Complaint  Patient presents with   Back Pain    Brandon Bradford is a 82 y.o.  male for evaluation and treatment of L side pain.  He is here with his wife who helps with the history.  Onset:  3 weeks ago. No inj or change in activity.  Location: L oblique/lower abdominal region Character:  aching and pressure   Progression of issue:  has slightly improved Associated symptoms: Penciling of his stools, colonoscopy in 2020 with polyps no diarrhea, N/V, swelling, bruising, redness. Treatment: to date has been muscle relaxers.   Neurovascular symptoms: no  Past Medical History:  Diagnosis Date   AAA (abdominal aortic aneurysm) (HCC)    3.4 cm   Arthritis    Cataract 06/2016   bilateral cataract extraction   Clotting disorder (HCC)    Colitis, ischemic (HCC)    Gastritis    GERD (gastroesophageal reflux disease)    Hx of colonic polyps    Hyperlipidemia    Hypertension    Paroxysmal atrial fibrillation (HCC)    Vertigo, intermittent     Objective: VITAL SIGNS: BP 122/68 (BP Location: Left Arm, Patient Position: Sitting, Cuff Size: Normal)   Pulse 65   Temp 97.6 F (36.4 C) (Oral)   Ht '5\' 4"'$  (1.626 m)   Wt 179 lb 8 oz (81.4 kg)   SpO2 98%   BMI 30.81 kg/m  Constitutional: Well formed, well developed. No acute distress. Thorax & Lungs: No accessory muscle use Musculoskeletal: L side.   Tenderness to palpation: yes, over the lateral oblique region Deformity: no Ecchymosis: no Abd: BS+, S, TTP in left lower quadrant, ND Neurologic: Normal sensory function. No focal deficits noted. DTR's equal and symmetric in LE's. No clonus. Psychiatric: Normal mood. Age appropriate judgment and insight. Alert & oriented x 3.    Assessment:  LLQ abdominal pain  Pencilling of stools  Left lower quadrant abdominal pain - Plan: CT Abdomen Pelvis W Contrast  Plan: New  problem with uncertain prognosis given the penciling of the stools and lower abdominal pain.  Check a CT of the abdomen to rule out mass versus infection versus stool burden. We will cancel the lumbar MRI as his pain is improved.  Stretch the side, heat, ice, Tylenol.  F/u pending above. The patient and spouse voiced understanding and agreement to the plan.   [place], DO 03/30/22  12:12 PM

## 2022-04-04 ENCOUNTER — Other Ambulatory Visit (HOSPITAL_BASED_OUTPATIENT_CLINIC_OR_DEPARTMENT_OTHER): Payer: Medicare Other

## 2022-04-08 ENCOUNTER — Ambulatory Visit (HOSPITAL_BASED_OUTPATIENT_CLINIC_OR_DEPARTMENT_OTHER)
Admission: RE | Admit: 2022-04-08 | Discharge: 2022-04-08 | Disposition: A | Discharge status: Home / Self Care | Payer: Medicare Other | Source: Ambulatory Visit | Attending: Family Medicine | Admitting: Family Medicine

## 2022-04-08 ENCOUNTER — Encounter (HOSPITAL_BASED_OUTPATIENT_CLINIC_OR_DEPARTMENT_OTHER): Payer: Self-pay

## 2022-04-08 DIAGNOSIS — K760 Fatty (change of) liver, not elsewhere classified: Secondary | ICD-10-CM | POA: Diagnosis not present

## 2022-04-08 DIAGNOSIS — R1032 Left lower quadrant pain: Secondary | ICD-10-CM

## 2022-04-08 DIAGNOSIS — R1030 Lower abdominal pain, unspecified: Secondary | ICD-10-CM | POA: Diagnosis not present

## 2022-04-08 MED ORDER — IOHEXOL 300 MG/ML  SOLN
100.0000 mL | Freq: Once | INTRAMUSCULAR | Status: AC | PRN
  Administered 2022-04-08: 100 mL via INTRAVENOUS

## 2022-04-10 ENCOUNTER — Ambulatory Visit (INDEPENDENT_AMBULATORY_CARE_PROVIDER_SITE_OTHER): Payer: Medicare Other | Admitting: Family Medicine

## 2022-04-10 ENCOUNTER — Encounter: Payer: Self-pay | Admitting: Family Medicine

## 2022-04-10 VITALS — BP 132/84 | HR 64 | Temp 97.6°F | Ht 67.0 in | Wt 177.2 lb

## 2022-04-10 DIAGNOSIS — R1032 Left lower quadrant pain: Secondary | ICD-10-CM | POA: Diagnosis not present

## 2022-04-10 DIAGNOSIS — N1831 Chronic kidney disease, stage 3a: Secondary | ICD-10-CM | POA: Diagnosis not present

## 2022-04-10 NOTE — Progress Notes (Signed)
Chief Complaint  Patient presents with   Referral    Kidney specialists    Subjective: Patient is a 82 y.o. male here for f/u.  He is here with his wife who helps with the history/interpretation ([place]).  The patient continues to have left lower quadrant abdominal pain and left side pain.  Both he and his wife are adamant that is not related to a musculoskeletal etiology.  He had a CT scan of his abdomen/pelvis that was unremarkable.  His last colonoscopy was in 2020.  He was having some penciling of his stools.  Tylenol makes him drowsy.  Things or not improving.    He is not pleased with his current nephrologist through [facility] and is wondering if he can switch to [facility].  He wonders if his kidneys could be contributing to this.  Past Medical History:  Diagnosis Date   AAA (abdominal aortic aneurysm) (HCC)    3.4 cm   Arthritis    Cataract 06/2016   bilateral cataract extraction   Clotting disorder (HCC)    Colitis, ischemic (HCC)    Gastritis    GERD (gastroesophageal reflux disease)    Hx of colonic polyps    Hyperlipidemia    Hypertension    Paroxysmal atrial fibrillation (HCC)    Vertigo, intermittent     Objective: BP 132/84 (BP Location: Left Arm, Patient Position: Sitting, Cuff Size: Normal)   Pulse 64   Temp 97.6 F (36.4 C) (Oral)   Ht 5\' 7"  (1.702 m)   Wt 177 lb 4 oz (80.4 kg)   SpO2 97%   BMI 27.76 kg/m  General: Awake, appears stated age Heart: RRR, no LE edema Lungs: CTAB, no rales, wheezes or rhonchi. No accessory muscle use Abdomen: TTP to deep palpation in the left lower quadrant without any masses or organomegaly appreciated.  Bowel sounds are present.  Abdomen is soft. MSK: Mild TTP over the lumbar erector spinae muscle group and lateral oblique of the left.  Negative straight leg testing bilaterally but tight hamstrings.  Resisted hip flexion on the right did reproduce pain on the left oblique region. Psych: Age appropriate judgment and  insight, normal affect and mood  Assessment and Plan: LLQ abdominal pain - Plan: Ambulatory referral to Gastroenterology  Stage 3a chronic kidney disease ([place]) - Plan: Ambulatory referral to Nephrology  Will refer to gastroenterology for further evaluation/possible scope.  I do think there is a reasonable likelihood this is related to an oblique muscle strain.  Both the patient and his wife are not ready to pursue this diagnosis at this time. Will refer to [facility] nephrology at his request.  I also do not think his kidneys are contributing to this, but he needs a new nephrologist anyways. The patient and his spouse voiced understanding and agreement to the plan.  [place], [place] 04/10/22  3:33 PM

## 2022-04-10 NOTE — Patient Instructions (Signed)
If you do not hear anything about your referral in the next 1-2 weeks, call our office and ask for an update.  Ice/cold pack over area for 10-15 min twice daily.  Heat (pad or rice pillow in microwave) over affected area, 10-15 minutes twice daily.   Let us know if you need anything.

## 2022-04-20 HISTORY — PX: ABDOMINAL AORTIC ENDOVASCULAR STENT GRAFT: SHX5707

## 2022-04-21 ENCOUNTER — Ambulatory Visit (INDEPENDENT_AMBULATORY_CARE_PROVIDER_SITE_OTHER): Payer: Medicare Other | Admitting: Internal Medicine

## 2022-04-21 ENCOUNTER — Encounter: Payer: Self-pay | Admitting: Internal Medicine

## 2022-04-21 VITALS — BP 120/60 | HR 62 | Temp 98.5°F | Ht 67.0 in | Wt 177.0 lb

## 2022-04-21 DIAGNOSIS — K929 Disease of digestive system, unspecified: Secondary | ICD-10-CM | POA: Diagnosis not present

## 2022-04-21 DIAGNOSIS — I10 Essential (primary) hypertension: Secondary | ICD-10-CM

## 2022-04-21 DIAGNOSIS — N1831 Chronic kidney disease, stage 3a: Secondary | ICD-10-CM | POA: Diagnosis not present

## 2022-04-21 DIAGNOSIS — R7303 Prediabetes: Secondary | ICD-10-CM

## 2022-04-21 DIAGNOSIS — I714 Abdominal aortic aneurysm, without rupture, unspecified: Secondary | ICD-10-CM

## 2022-04-21 MED ORDER — RIFAXIMIN 200 MG PO TABS: 200.0000 mg | ORAL_TABLET | Freq: Three times a day (TID) | ORAL | 0 refills | Status: AC

## 2022-04-21 NOTE — Assessment & Plan Note (Signed)
With new change in BM and gas/odor in the last few months and pain in the LLQ. Prior partial colectomy. Review of recent labs and imaging and visits. He could have some bacterial overgrowth. Will try 1 week xifaxan and continue probiotic. He is pending GI visit and will see. Last colonoscopy 2020 so likely does not need repeat. 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 If no improvement can try lidoderm patches.

## 2022-04-21 NOTE — Assessment & Plan Note (Signed)
BP at goal on losartan 25 mg daily and metoprolol 50 mg daily. Recent labs stable continue at same dosing.

## 2022-04-21 NOTE — Patient Instructions (Addendum)
We have sent in xifaxan to take for the gut. Take 1 pill 3 times a day for 1 week.

## 2022-04-21 NOTE — Assessment & Plan Note (Signed)
Recent CT reviewed without change and following every 6 months with cardiology.

## 2022-04-21 NOTE — Assessment & Plan Note (Signed)
Pending new nephrologist through [facility] and review of records indicate stable renal function. BP at goal and no diabetes.

## 2022-04-21 NOTE — Progress Notes (Signed)
   Subjective:   Patient ID: Brandon Bradford, male    DOB: 1940-06-24, 82 y.o.   MRN: [id number]  Abdominal Pain Pertinent negatives include no constipation, diarrhea, nausea or vomiting.   The patient is an 82 YO transfer of care. Having ongoing issues with abdominal pain. LLQ and prior partial colectomy 2011. Recent CT scan and labs fairly normal. Pain with eating and relief with BM. BM are fairly normal denies constipation or diarrhea. No blood in stool.   Review of Systems  Constitutional: Negative.   HENT: Negative.    Eyes: Negative.   Respiratory:  Negative for cough, chest tightness and shortness of breath.   Cardiovascular:  Negative for chest pain, palpitations and leg swelling.  Gastrointestinal:  Positive for abdominal pain. Negative for abdominal distention, constipation, diarrhea, nausea and vomiting.  Musculoskeletal: Negative.   Skin: Negative.   Neurological: Negative.   Psychiatric/Behavioral: Negative.      Objective:  Physical Exam Constitutional:      Appearance: He is well-developed.  HENT:     Head: Normocephalic and atraumatic.  Cardiovascular:     Rate and Rhythm: Normal rate and regular rhythm.  Pulmonary:     Effort: Pulmonary effort is normal. No respiratory distress.     Breath sounds: Normal breath sounds. No wheezing or rales.  Abdominal:     General: Bowel sounds are normal. There is no distension.     Palpations: Abdomen is soft.     Tenderness: There is abdominal tenderness in the left lower quadrant. There is no rebound.  Musculoskeletal:     Cervical back: Normal range of motion.  Skin:    General: Skin is warm and dry.  Neurological:     Mental Status: He is alert and oriented to person, place, and time.     Coordination: Coordination normal.     Vitals:   04/21/22 1404  BP: 120/60  Pulse: 62  Temp: 98.5 F (36.9 C)  TempSrc: Oral  SpO2: 94%  Weight: 177 lb (80.3 kg)  Height: 5\' 7"  (1.702 m)    Assessment & Plan:  Visit time 25  minutes in face to face communication with patient and coordination of care, additional 20 minutes spent in record review, coordination or care, ordering tests, communicating/referring to other healthcare professionals, documenting in medical records all on the same day of the visit for total time 45 minutes spent on the visit.

## 2022-04-21 NOTE — Assessment & Plan Note (Signed)
Most recent labs up to date and monitor every 6-12 months.

## 2022-04-23 ENCOUNTER — Telehealth: Payer: Self-pay | Admitting: Internal Medicine

## 2022-04-23 NOTE — Telephone Encounter (Signed)
Patient's wife called and said  rifaximin (XIFAXAN) 200 MG tablet needed a prior authorization. It was sent to [id number] - [place], [place]. Best callback is [phone].

## 2022-04-23 NOTE — Telephone Encounter (Signed)
Forwarding msg to RX PA team../lmb

## 2022-04-24 ENCOUNTER — Other Ambulatory Visit (HOSPITAL_COMMUNITY): Payer: Self-pay

## 2022-04-24 NOTE — Telephone Encounter (Signed)
Digestive Problems, Dx K92.9../;LMB

## 2022-04-24 NOTE — Telephone Encounter (Signed)
Please provide diagnosis for Xifaxan to submit correct PA

## 2022-04-27 NOTE — Telephone Encounter (Signed)
Patient's wife called to check on the status of PA. Best callback is [phone].

## 2022-04-28 ENCOUNTER — Ambulatory Visit: Payer: Medicare Other | Admitting: Internal Medicine

## 2022-04-29 ENCOUNTER — Telehealth: Payer: Self-pay | Admitting: Internal Medicine

## 2022-04-29 ENCOUNTER — Telehealth: Payer: Self-pay

## 2022-04-29 NOTE — Telephone Encounter (Signed)
[facility] representative called and said they need the diagnosis for patient's PA. It is for the medication rifaximin (XIFAXAN) 200 MG tablet  Best callback for [facility] is [phone].

## 2022-04-29 NOTE — Telephone Encounter (Signed)
Patient Advocate Encounter   Received notification from [facility] that prior authorization for Xifaxan is required.   PA submitted on 04/29/2022 Key [id number] Status is pending

## 2022-04-29 NOTE — Telephone Encounter (Signed)
PA is pending, documented in separate encounter.

## 2022-04-30 NOTE — Telephone Encounter (Signed)
Digestive Problems, Dx K92.9../;LMB

## 2022-05-05 ENCOUNTER — Ambulatory Visit: Payer: Medicare Other | Admitting: Internal Medicine

## 2022-05-09 NOTE — Telephone Encounter (Signed)
PA Denied: Letter in Media

## 2022-05-11 DIAGNOSIS — E785 Hyperlipidemia, unspecified: Secondary | ICD-10-CM | POA: Diagnosis not present

## 2022-05-11 DIAGNOSIS — I7143 Infrarenal abdominal aortic aneurysm, without rupture: Secondary | ICD-10-CM | POA: Diagnosis not present

## 2022-05-11 DIAGNOSIS — N183 Chronic kidney disease, stage 3 unspecified: Secondary | ICD-10-CM | POA: Diagnosis not present

## 2022-05-11 DIAGNOSIS — I480 Paroxysmal atrial fibrillation: Secondary | ICD-10-CM | POA: Diagnosis not present

## 2022-05-11 DIAGNOSIS — Z9049 Acquired absence of other specified parts of digestive tract: Secondary | ICD-10-CM | POA: Diagnosis not present

## 2022-05-11 DIAGNOSIS — Z86010 Personal history of colonic polyps: Secondary | ICD-10-CM | POA: Diagnosis not present

## 2022-05-11 DIAGNOSIS — R1032 Left lower quadrant pain: Secondary | ICD-10-CM | POA: Diagnosis not present

## 2022-05-11 DIAGNOSIS — I129 Hypertensive chronic kidney disease with stage 1 through stage 4 chronic kidney disease, or unspecified chronic kidney disease: Secondary | ICD-10-CM | POA: Diagnosis not present

## 2022-05-12 ENCOUNTER — Telehealth: Payer: Self-pay

## 2022-05-12 NOTE — Telephone Encounter (Signed)
Contacted Nas Wafer to schedule their annual wellness visit. Appointment made for 05/18/22.

## 2022-05-18 ENCOUNTER — Encounter: Payer: Self-pay | Admitting: *Deleted

## 2022-05-18 ENCOUNTER — Telehealth: Payer: Self-pay | Admitting: *Deleted

## 2022-05-18 NOTE — Transitions of Care (Post Inpatient/ED Visit) (Signed)
   05/18/2022  Name: Brandon Bradford MRN: [id number] DOB: 04-Jun-1940  Today's TOC FU Call Status: Today's TOC FU Call Status:: Unsuccessul Call (1st Attempt) Unsuccessful Call (1st Attempt) Date: 05/18/22  Attempted to reach the patient regarding the most recent Inpatient visit; left HIPAA compliant voice message requesting call back  Follow Up Plan: Additional outreach attempts will be made to reach the patient to complete the Transitions of Care (Post Inpatient visit) call.   Caryl Pina, RN, BSN, CCRN Alumnus RN CM Care Coordination/ Transition of Care- [facility] Care Management [phone]: direct office

## 2022-05-19 ENCOUNTER — Telehealth: Payer: Self-pay | Admitting: Internal Medicine

## 2022-05-19 ENCOUNTER — Encounter: Payer: Self-pay | Admitting: *Deleted

## 2022-05-19 ENCOUNTER — Telehealth: Payer: Self-pay | Admitting: *Deleted

## 2022-05-19 NOTE — Telephone Encounter (Signed)
Fine with me

## 2022-05-19 NOTE — Telephone Encounter (Signed)
Informed the patients wife. Updated chart.

## 2022-05-19 NOTE — Telephone Encounter (Signed)
OK w me.  

## 2022-05-19 NOTE — Telephone Encounter (Signed)
Pt's spouse stated pt switched provider to Hillard Danker due to she is an internal medicine, but pt's spouse states does not need the internal medicine doctor and is wanting to know if he can change provider back to Dr Carmelia Roller if ok with providers. Please advise.

## 2022-05-19 NOTE — Transitions of Care (Post Inpatient/ED Visit) (Signed)
05/19/2022  Name: Brandon Bradford MRN: [id number] DOB: 05-10-1940  Today's TOC FU Call Status: Today's TOC FU Call Status:: Successful TOC FU Call Competed TOC FU Call Complete Date: 05/19/22  Transition Care Management Follow-up Telephone Call Date of Discharge: 05/16/22 Discharge Facility: Other ([facility]) Name of Other ([facility]) Discharge Facility: [facility] Type of Discharge: Inpatient Admission Primary Inpatient Discharge Diagnosis:: (L) lower quadrant abdominal pain with surgery How have you been since you were released from the hospital?: Better (per patient's spouse: "he is doing better now after the surgery, he has all of his medicines and he is walking around without using a cane.  Thank you so much for getting this appointment  scheduled with Dr. Carmelia Roller.") Any questions or concerns?: No  Items Reviewed: Did you receive and understand the discharge instructions provided?: Yes (reviewed briefly with patient's caregiver who verbalizes good understanding of same-- outside hospital discharge instructions) Medications obtained and verified?: No (caregiver declines full medication review; states no medication concerns post-recent hospital discharge- encouraged to take all current medications to upcoming PCP appointment scheduled 05/25/22) Medications Not Reviewed Reasons:: Other: (spouse/ caregiver declined) Any new allergies since your discharge?: No Dietary orders reviewed?: Yes Type of Diet Ordered:: conservative/ bland Do you have support at home?: Yes People in Home: spouse Name of Support/Comfort Primary Source: Reports independent in self-care activities; supportive spouse assists as/ if needed/ indicated  Home Care and Equipment/Supplies: Were Home Health Services Ordered?: No Any new equipment or medical supplies ordered?: No  Functional Questionnaire: Do you need assistance with bathing/showering or dressing?: Yes (spouse assists as needed after recent surgery) Do  you need assistance with meal preparation?: Yes (spouse assists as needed after recent surgery) Do you need assistance with eating?: No Do you have difficulty maintaining continence: No Do you need assistance with getting out of bed/getting out of a chair/moving?: Yes (spouse assists as needed after recent surgery) Do you have difficulty managing or taking your medications?: Yes (spouse manages medications)  Follow up appointments reviewed: PCP Follow-up appointment confirmed?: Yes (care coordination outreach in real-time with scheduling care guide to successfully schedule hospital follow up PCP appointment) Date of PCP follow-up appointment?: 05/25/22 Follow-up Provider: PCP Specialist Hospital Follow-up appointment confirmed?: Yes Date of Specialist follow-up appointment?: 06/17/22 Follow-Up Specialty Provider:: Surgeon at [facility] Do you need transportation to your follow-up appointment?: No Do you understand care options if your condition(s) worsen?: Yes-patient verbalized understanding  SDOH Interventions Today    Flowsheet Row Most Recent Value  SDOH Interventions   Food Insecurity Interventions Intervention Not Indicated  Transportation Interventions Intervention Not Indicated  [spouse provides transportation]      TOC Interventions Today    Flowsheet Row Most Recent Value  TOC Interventions   TOC Interventions Discussed/Reviewed TOC Interventions Discussed, Arranged PCP follow up less than 12 days/Care Guide scheduled  [Spouse declines need for ongoing/ further care coordination outreach,  no care coordination needs identified at time of TOC call today,  provided my direct contact information should questions/ concerns/ needs arise post-TOC call]      Interventions Today    Flowsheet Row Most Recent Value  Chronic Disease   Chronic disease during today's visit Other  [recent surgery]  General Interventions   General Interventions Discussed/Reviewed General Interventions  Discussed, Doctor Visits  Doctor Visits Discussed/Reviewed Doctor Visits Discussed, PCP, Specialist  PCP/Specialist Visits Compliance with follow-up visit  Nutrition Interventions   Nutrition Discussed/Reviewed Nutrition Discussed  Pharmacy Interventions   Pharmacy Dicussed/Reviewed Pharmacy Topics Discussed  Oneta Rack, RN, BSN, CCRN Alumnus RN CM Care Coordination/ Transition of [place] Management [phone]: direct office

## 2022-05-25 ENCOUNTER — Ambulatory Visit (INDEPENDENT_AMBULATORY_CARE_PROVIDER_SITE_OTHER): Payer: Medicare Other | Admitting: Family Medicine

## 2022-05-25 ENCOUNTER — Telehealth: Payer: Self-pay | Admitting: Family Medicine

## 2022-05-25 ENCOUNTER — Encounter: Payer: Self-pay | Admitting: Family Medicine

## 2022-05-25 VITALS — BP 143/90 | HR 81 | Temp 97.9°F | Ht 67.0 in | Wt 174.0 lb

## 2022-05-25 DIAGNOSIS — E785 Hyperlipidemia, unspecified: Secondary | ICD-10-CM | POA: Diagnosis not present

## 2022-05-25 DIAGNOSIS — G8929 Other chronic pain: Secondary | ICD-10-CM

## 2022-05-25 DIAGNOSIS — R1030 Lower abdominal pain, unspecified: Secondary | ICD-10-CM | POA: Diagnosis not present

## 2022-05-25 DIAGNOSIS — I10 Essential (primary) hypertension: Secondary | ICD-10-CM

## 2022-05-25 MED ORDER — METOPROLOL SUCCINATE ER 50 MG PO TB24: 50.0000 mg | ORAL_TABLET | Freq: Every day | ORAL | 3 refills | Status: DC

## 2022-05-25 MED ORDER — LOSARTAN POTASSIUM 25 MG PO TABS: 25.0000 mg | ORAL_TABLET | Freq: Every day | ORAL | 3 refills | Status: DC

## 2022-05-25 MED ORDER — ATORVASTATIN CALCIUM 20 MG PO TABS: 20.0000 mg | ORAL_TABLET | Freq: Every day | ORAL | 3 refills | Status: DC

## 2022-05-25 NOTE — Progress Notes (Signed)
Chief Complaint  Patient presents with   Hospitalization Follow-up    Subjective: Patient is a 82 y.o. male here for hosp f/u.  He is here with his wife who helps with the history.  The patient was in [facility] hospital from 05/11/2022 and discharged on 05/16/2022.  He went in for chronic left lower quadrant abdominal pain.  They ran another CT scan that showed an aortic aneurysm of 5.6 cm, recently increased from 5.3 cm.  He had an endovascular surgery to repair the situation on 05/15/2022.  He is recovering well from the situation.  He is continues to have left lower quadrant abdominal pain.  CT scans have been unremarkable.  He is waiting an appointment with the gastroenterology team.  No nausea, vomiting, weight loss, bleeding, or bowel changes.  He has been diagnosed with a possible musculo-skeletal etiology but wishes to avoid this diagnosis before seeing the gastroenterologist.  Past Medical History:  Diagnosis Date   AAA (abdominal aortic aneurysm) (HCC)    3.4 cm   Arthritis    Cataract 06/2016   bilateral cataract extraction   Clotting disorder (HCC)    Colitis, ischemic (HCC)    Gastritis    GERD (gastroesophageal reflux disease)    Hx of colonic polyps    Hyperlipidemia    Hypertension    Paroxysmal atrial fibrillation (HCC)    Vertigo, intermittent     Objective: BP (!) 143/90 (BP Location: Left Arm, Patient Position: Sitting, Cuff Size: Normal)   Pulse 81   Temp 97.9 F (36.6 C) (Oral)   Ht 5\' 7"  (1.702 m)   Wt 174 lb (78.9 kg)   SpO2 97%   BMI 27.25 kg/m  General: Awake, appears stated age Heart: RRR, no LE edema  Skin: Bilateral inguinal region, there are 2 linear areas of excoriation without surrounding erythema, ecchymosis, or edema. Lungs: CTAB, no rales, wheezes or rhonchi. No accessory muscle use Abdomen: Bowel sounds present, soft, non distended, mild TTP over light palpation over the left anterior oblique without any pain with deeper palpation Psych: Age  appropriate judgment and insight, normal affect and mood  Assessment and Plan: Chronic abdominal pain - Plan: Ambulatory referral to Gastroenterology  Essential hypertension - Plan: losartan (COZAAR) 25 MG tablet, metoprolol succinate (TOPROL-XL) 50 MG 24 hr tablet  Hyperlipidemia, unspecified hyperlipidemia type - Plan: atorvastatin (LIPITOR) 20 MG tablet  He requested a referral to Dr. Bradd Burner.  Will place referral today.  Labs look good upon discharge.  Vascular access sites appear clean, dry, and intact.  With a negative workup thus far, there are more willing to accept the diagnosis of a musculoskeletal etiology.  Would consider physical therapy if GI workup unremarkable. The patient and his wife voiced understanding and agreement to the plan.  I spent 35 minutes with the patient discussing above plans in addition to reviewing his chart on the same date of visit.  Jilda Roche [place], DO 05/25/22  3:04 PM

## 2022-05-25 NOTE — Patient Instructions (Signed)
If you do not hear anything about your referral in the next 1-2 weeks, call our office and ask for an update.  Let us know if you need anything.  

## 2022-05-25 NOTE — Telephone Encounter (Signed)
Dr Leola Brazil office from [facility] in [place] called to let us know that their office is not accepting new patient with Medicare so they cannot see him there. Please advise.

## 2022-06-01 ENCOUNTER — Other Ambulatory Visit (INDEPENDENT_AMBULATORY_CARE_PROVIDER_SITE_OTHER): Payer: Medicare Other

## 2022-06-01 ENCOUNTER — Other Ambulatory Visit: Payer: Medicare Other

## 2022-06-01 DIAGNOSIS — E785 Hyperlipidemia, unspecified: Secondary | ICD-10-CM | POA: Diagnosis not present

## 2022-06-01 DIAGNOSIS — M1A9XX0 Chronic gout, unspecified, without tophus (tophi): Secondary | ICD-10-CM | POA: Diagnosis not present

## 2022-06-01 DIAGNOSIS — R7303 Prediabetes: Secondary | ICD-10-CM

## 2022-06-01 LAB — COMPREHENSIVE METABOLIC PANEL
ALT: 18 U/L (ref 0–53)
AST: 21 U/L (ref 0–37)
Albumin: 4.5 g/dL (ref 3.5–5.2)
Alkaline Phosphatase: 57 U/L (ref 39–117)
BUN: 23 mg/dL (ref 6–23)
CO2: 28 mEq/L (ref 19–32)
Calcium: 10.1 mg/dL (ref 8.4–10.5)
Chloride: 103 mEq/L (ref 96–112)
Creatinine, Ser: 1.31 mg/dL (ref 0.40–1.50)
GFR: 51.09 mL/min — ABNORMAL LOW (ref >60.00)
Glucose, Bld: 109 mg/dL — ABNORMAL HIGH (ref 70–99)
Potassium: 5.1 mEq/L (ref 3.5–5.1)
Sodium: 140 mEq/L (ref 135–145)
Total Bilirubin: 0.9 mg/dL (ref 0.2–1.2)
Total Protein: 7.5 g/dL (ref 6.0–8.3)

## 2022-06-01 LAB — LIPID PANEL
Cholesterol: 161 mg/dL (ref 0–200)
HDL: 58 mg/dL (ref >39.00)
LDL Cholesterol: 71 mg/dL (ref 0–99)
NonHDL: 102.85
Total CHOL/HDL Ratio: 3
Triglycerides: 159 mg/dL — ABNORMAL HIGH (ref 0.0–149.0)
VLDL: 31.8 mg/dL (ref 0.0–40.0)

## 2022-06-01 LAB — URIC ACID: Uric Acid, Serum: 7.8 mg/dL (ref 4.0–7.8)

## 2022-06-01 LAB — HEMOGLOBIN A1C: Hgb A1c MFr Bld: 6.1 % (ref 4.6–6.5)

## 2022-06-08 ENCOUNTER — Ambulatory Visit: Payer: Medicare Other | Admitting: Family Medicine

## 2022-06-08 ENCOUNTER — Ambulatory Visit (INDEPENDENT_AMBULATORY_CARE_PROVIDER_SITE_OTHER): Payer: Medicare Other | Admitting: Family Medicine

## 2022-06-08 ENCOUNTER — Encounter: Payer: Self-pay | Admitting: Family Medicine

## 2022-06-08 VITALS — BP 121/70 | HR 66 | Temp 97.6°F | Ht 67.0 in | Wt 176.2 lb

## 2022-06-08 DIAGNOSIS — G8929 Other chronic pain: Secondary | ICD-10-CM

## 2022-06-08 DIAGNOSIS — I10 Essential (primary) hypertension: Secondary | ICD-10-CM

## 2022-06-08 DIAGNOSIS — R109 Unspecified abdominal pain: Secondary | ICD-10-CM | POA: Diagnosis not present

## 2022-06-08 DIAGNOSIS — M1A9XX0 Chronic gout, unspecified, without tophus (tophi): Secondary | ICD-10-CM | POA: Diagnosis not present

## 2022-06-08 DIAGNOSIS — E785 Hyperlipidemia, unspecified: Secondary | ICD-10-CM

## 2022-06-08 DIAGNOSIS — R7303 Prediabetes: Secondary | ICD-10-CM

## 2022-06-08 NOTE — Patient Instructions (Addendum)
Keep the diet clean and stay active.  If you do not hear anything about your referral in the next 1-2 weeks, call our office and ask for an update.  Let us know if you need anything.  

## 2022-06-08 NOTE — Progress Notes (Signed)
Chief Complaint  Patient presents with   Follow-up    Subjective Brandon Bradford is a 82 y.o. male who presents for hypertension follow up. He does monitor home blood pressures. Blood pressures ranging from 130's/70's on average. He is compliant with medications- Toprol XL 50 mg/d, losartan 25 mg daily. Patient has these side effects of medication: none He is adhering to a healthy diet overall. Current exercise: Golf, walking No CP or SOB.   Hyperlipidemia Patient presents for hyperlipidemia follow up. Currently being treated with Lipitor 20 mg daily and compliance with treatment thus far has been good. He denies myalgias. Diet/exercise as above. The patient is not known to have coexisting coronary artery disease.  Gout Patient has a history of gout.  Well-controlled.  He is not on a maintenance medication.  Avoids foods high in purine's.  Takes colchicine as needed.  No adverse effects with this medication.  Has not needed in a while.   Past Medical History:  Diagnosis Date   AAA (abdominal aortic aneurysm) (HCC)    3.4 cm   Arthritis    Cataract 06/2016   bilateral cataract extraction   Clotting disorder (HCC)    Colitis, ischemic (HCC)    Gastritis    GERD (gastroesophageal reflux disease)    Hx of colonic polyps    Hyperlipidemia    Hypertension    Paroxysmal atrial fibrillation (HCC)    Vertigo, intermittent     Exam BP 121/70 (BP Location: Left Arm, Patient Position: Sitting, Cuff Size: Normal)   Pulse 66   Temp 97.6 F (36.4 C) (Oral)   Ht 5\' 7"  (1.702 m)   Wt 176 lb 4 oz (79.9 kg)   SpO2 93%   BMI 27.60 kg/m  General:  well developed, well nourished, in no apparent distress Heart: RRR, no bruits, no LE edema Lungs: clear to auscultation, no accessory muscle use Psych: well oriented with normal range of affect and appropriate judgment/insight  Essential hypertension  Hyperlipidemia, unspecified hyperlipidemia type  Chronic, stable.  Continue Toprol-XL 50  mg daily, losartan 25 mg daily.  Counseled on diet and exercise. Chronic, stable.  Continue Lipitor 20 mg daily. Chronic, stable.  Continue colchicine as needed. F/u in 6 months. The patient voiced understanding and agreement to the plan.  Jilda Roche [place], DO 06/08/22  1:50 PM

## 2022-06-10 ENCOUNTER — Telehealth: Payer: Self-pay | Admitting: Family Medicine

## 2022-06-10 NOTE — Telephone Encounter (Signed)
Wife, Maple Hudson, called to speak to CMA regarding pt. Please call to discuss

## 2022-06-10 NOTE — Telephone Encounter (Signed)
Patients wife wanted to confirm referral to DR. Moon was sent over. Sent [facility] a message to confirm and resend if necessary. The patients wife would like to speak to Dr. Carmelia Roller regarding her husband. I informed the wife the doctor was in clinic today  seeing patients, but she persisted that she needed to speak to PCP

## 2022-06-10 NOTE — Telephone Encounter (Signed)
Referral sent a second time by [facility]. Called the wife informed of PCP response to request. She scheduled OV with PCP on 06/19/2022

## 2022-06-17 DIAGNOSIS — Z95828 Presence of other vascular implants and grafts: Secondary | ICD-10-CM | POA: Diagnosis not present

## 2022-06-17 DIAGNOSIS — I714 Abdominal aortic aneurysm, without rupture, unspecified: Secondary | ICD-10-CM | POA: Diagnosis not present

## 2022-06-19 ENCOUNTER — Ambulatory Visit: Payer: Medicare Other | Admitting: Family Medicine

## 2022-06-24 DIAGNOSIS — R058 Other specified cough: Secondary | ICD-10-CM | POA: Diagnosis not present

## 2022-06-24 DIAGNOSIS — R519 Headache, unspecified: Secondary | ICD-10-CM | POA: Diagnosis not present

## 2022-06-24 DIAGNOSIS — R030 Elevated blood-pressure reading, without diagnosis of hypertension: Secondary | ICD-10-CM | POA: Diagnosis not present

## 2022-06-24 DIAGNOSIS — Z20822 Contact with and (suspected) exposure to covid-19: Secondary | ICD-10-CM | POA: Diagnosis not present

## 2022-06-25 DIAGNOSIS — R100 Acute abdomen: Secondary | ICD-10-CM | POA: Diagnosis not present

## 2022-06-27 DIAGNOSIS — I10 Essential (primary) hypertension: Secondary | ICD-10-CM | POA: Diagnosis not present

## 2022-06-27 DIAGNOSIS — J069 Acute upper respiratory infection, unspecified: Secondary | ICD-10-CM | POA: Diagnosis not present

## 2022-06-29 ENCOUNTER — Ambulatory Visit (INDEPENDENT_AMBULATORY_CARE_PROVIDER_SITE_OTHER): Payer: Medicare Other | Admitting: Family Medicine

## 2022-06-29 ENCOUNTER — Encounter: Payer: Self-pay | Admitting: Family Medicine

## 2022-06-29 ENCOUNTER — Ambulatory Visit (HOSPITAL_BASED_OUTPATIENT_CLINIC_OR_DEPARTMENT_OTHER)
Admission: RE | Admit: 2022-06-29 | Discharge: 2022-06-29 | Disposition: A | Discharge status: Home / Self Care | Payer: Medicare Other | Source: Ambulatory Visit | Attending: Family Medicine | Admitting: Family Medicine

## 2022-06-29 VITALS — BP 121/70 | HR 79 | Temp 98.9°F | Ht 67.0 in | Wt 172.1 lb

## 2022-06-29 DIAGNOSIS — J40 Bronchitis, not specified as acute or chronic: Secondary | ICD-10-CM | POA: Diagnosis not present

## 2022-06-29 DIAGNOSIS — R059 Cough, unspecified: Secondary | ICD-10-CM | POA: Diagnosis not present

## 2022-06-29 MED ORDER — AZITHROMYCIN 250 MG PO TABS
ORAL_TABLET | ORAL | 0 refills | Status: DC
Start: 2022-06-29 — End: 2022-09-30

## 2022-06-29 MED ORDER — BENZONATATE 200 MG PO CAPS
200.0000 mg | ORAL_CAPSULE | Freq: Two times a day (BID) | ORAL | 0 refills | Status: DC | PRN
Start: 2022-06-29 — End: 2022-09-30

## 2022-06-29 NOTE — Progress Notes (Signed)
Chief Complaint  Patient presents with   Cough    Brandon Bradford here for URI complaints. Here w spouse.   Duration: 8 days  Associated symptoms: subjective fever, sore throat, shortness of breath, and productive cough Denies: sinus congestion, sinus pain, rhinorrhea, itchy watery eyes, ear pain, ear drainage, wheezing, myalgia, and N/V/D Treatment to date: none Sick contacts: Yes; spouse- got better w Zpak Tested neg for covid.   Past Medical History:  Diagnosis Date   AAA (abdominal aortic aneurysm) (HCC)    3.4 cm   Arthritis    Cataract 06/2016   bilateral cataract extraction   Clotting disorder (HCC)    Colitis, ischemic (HCC)    Gastritis    GERD (gastroesophageal reflux disease)    Hx of colonic polyps    Hyperlipidemia    Hypertension    Paroxysmal atrial fibrillation (HCC)    Vertigo, intermittent     Objective BP 121/70 (BP Location: Right Arm, Patient Position: Sitting, Cuff Size: Normal)   Pulse 79   Temp 98.9 F (37.2 C) (Oral)   Ht 5\' 7"  (1.702 m)   Wt 172 lb 2 oz (78.1 kg)   SpO2 94%   BMI 26.96 kg/m  General: Awake, alert, appears stated age HEENT: AT, [place], ears patent b/l and TM's neg, nares patent w/o discharge, pharynx pink and without exudates, MMM Neck: No masses or asymmetry Heart: RRR Lungs: CTAB, no accessory muscle use Psych: Age appropriate judgment and insight, normal mood and affect  Bronchitis - Plan: benzonatate (TESSALON) 200 MG capsule, azithromycin (ZITHROMAX) 250 MG tablet, DG Chest 2 View  Worsening. Tessalon Perles. Ck CXR. Wife did well with Zpak, will send same for him. Continue to push fluids, practice good hand hygiene, cover mouth when coughing. F/u prn. If starting to experience fevers, shaking, or worsening shortness of breath, seek immediate care. Pt voiced understanding and agreement to the plan.  Jilda Roche [place], DO 06/29/22 3:10 PM

## 2022-06-29 NOTE — Patient Instructions (Signed)
Continue to push fluids, practice good hand hygiene, and cover your mouth if you cough. ? ?If you start having fevers, shaking or shortness of breath, seek immediate care. ? ?OK to take Tylenol 1000 mg (2 extra strength tabs) or 975 mg (3 regular strength tabs) every 6 hours as needed. ? ?Let us know if you need anything. ?

## 2022-07-03 DIAGNOSIS — I714 Abdominal aortic aneurysm, without rupture, unspecified: Secondary | ICD-10-CM | POA: Diagnosis not present

## 2022-07-06 ENCOUNTER — Ambulatory Visit: Payer: Medicare Other | Admitting: Nurse Practitioner

## 2022-07-13 DIAGNOSIS — R100 Acute abdomen: Secondary | ICD-10-CM | POA: Diagnosis not present

## 2022-08-03 ENCOUNTER — Telehealth: Payer: Self-pay | Admitting: Family Medicine

## 2022-08-03 ENCOUNTER — Other Ambulatory Visit: Payer: Self-pay | Admitting: Family Medicine

## 2022-08-03 DIAGNOSIS — G8929 Other chronic pain: Secondary | ICD-10-CM

## 2022-08-03 NOTE — Telephone Encounter (Signed)
Referral placed.

## 2022-08-03 NOTE — Telephone Encounter (Signed)
Patients wife came in to request a new GI referral to [facility] cannot see him until September. Patient still having the same pain and needs a colonoscopy. The wife called below MD and he can be seen much sooner than GI in our location. Dr. Lutricia Feil [facility] [place] [address] [place] [phone]

## 2022-09-14 DIAGNOSIS — Z86010 Personal history of colonic polyps: Secondary | ICD-10-CM | POA: Diagnosis not present

## 2022-09-14 DIAGNOSIS — R194 Change in bowel habit: Secondary | ICD-10-CM | POA: Diagnosis not present

## 2022-09-14 DIAGNOSIS — G8929 Other chronic pain: Secondary | ICD-10-CM | POA: Diagnosis not present

## 2022-09-14 DIAGNOSIS — R1032 Left lower quadrant pain: Secondary | ICD-10-CM | POA: Diagnosis not present

## 2022-09-23 ENCOUNTER — Other Ambulatory Visit: Payer: Medicare Other

## 2022-09-28 ENCOUNTER — Ambulatory Visit: Payer: Medicare Other | Admitting: Gastroenterology

## 2022-09-30 ENCOUNTER — Ambulatory Visit: Payer: Medicare Other | Admitting: Cardiology

## 2022-09-30 ENCOUNTER — Encounter: Payer: Self-pay | Admitting: Cardiology

## 2022-09-30 VITALS — BP 129/79 | HR 66 | Resp 16 | Ht 67.0 in | Wt 182.0 lb

## 2022-09-30 DIAGNOSIS — Z9889 Other specified postprocedural states: Secondary | ICD-10-CM | POA: Insufficient documentation

## 2022-09-30 DIAGNOSIS — I10 Essential (primary) hypertension: Secondary | ICD-10-CM

## 2022-09-30 DIAGNOSIS — Z8679 Personal history of other diseases of the circulatory system: Secondary | ICD-10-CM

## 2022-09-30 DIAGNOSIS — R109 Unspecified abdominal pain: Secondary | ICD-10-CM | POA: Diagnosis not present

## 2022-09-30 MED ORDER — DICYCLOMINE HCL 10 MG PO CAPS
10.0000 mg | ORAL_CAPSULE | Freq: Three times a day (TID) | ORAL | 0 refills | Status: DC
Start: 2022-09-30 — End: 2023-09-10

## 2022-09-30 NOTE — Progress Notes (Signed)
Primary Physician/Referring:  Sharlene Dory, DO  Patient ID: Delorian Sabia, male    DOB: [birth year], 82 y.o.   MRN: [id number]  Chief Complaint  Patient presents with   AAA   Hypertension   Atrial Fibrillation   Follow-up    1 year   HPI:    Dasmine Troeger  is a 82 y.o. Ree Edman male with hypertension, stage IIIa chronic kidney disease, hyperlipidemia, paroxysmal atrial fibrillation initially diagnosed in 2011 and again on 07/24/2021 when he presented to the emergency room found to be with recurrence of atrial fibrillation with controlled ventricular response.  He has had no recurrence of atrial fibrillation and not on anticoagulation per patient preference and relatively low A-fib burden.  He has been having chronic left lower abdominal discomfort and went to [facility] emergency room where CT scan of the abdomen revealed presence of the aneurysm that had been relatively stable with regard to the size however felt he needs endovascular repair due to abdominal discomfort and underwent EVAR on 05/15/2022 with bilateral iliac extension.  He now presents for follow-up, continues to have episodes of left abdominal discomfort.  Past Medical History:  Diagnosis Date   AAA (abdominal aortic aneurysm) (HCC)    3.4 cm   Arthritis    Cataract 06/2016   bilateral cataract extraction   Clotting disorder (HCC)    Colitis, ischemic (HCC)    Gastritis    GERD (gastroesophageal reflux disease)    Hx of colonic polyps    Hyperlipidemia    Hypertension    Paroxysmal atrial fibrillation (HCC)    Vertigo, intermittent    Past Surgical History:  Procedure Laterality Date   APPENDECTOMY  1971   CAROTID STENT     CATARACT EXTRACTION, BILATERAL     HEMICOLECTOMY     Right   HEMORROIDECTOMY  2002   Family History  Problem Relation Age of Onset   Hypertension Mother    Colon cancer Neg Hx    Stomach cancer Neg Hx    Esophageal cancer Neg Hx    Colon polyps Neg Hx     Social History   Tobacco Use    Smoking status: Former    Current packs/day: 0.00    Types: Cigarettes    Quit date: 10/13/1995    Years since quitting: 26.9   Smokeless tobacco: Never  Substance Use Topics   Alcohol use: No    Alcohol/week: 0.0 standard drinks of alcohol   Marital Status: Married  ROS  Review of Systems  Cardiovascular:  Negative for chest pain, dyspnea on exertion and leg swelling.  Gastrointestinal:  Negative for melena.   Objective  Blood pressure (!) 149/79, pulse 66, resp. rate 16, height 5\' 7"  (1.702 m), weight 182 lb (82.6 kg), SpO2 97%.     09/30/2022    2:15 PM 06/29/2022    2:14 PM 06/08/2022    1:47 PM  Vitals with BMI  Height 5\' 7"  5\' 7"  5\' 7"   Weight 182 lbs 172 lbs 2 oz 176 lbs 4 oz  BMI 28.5 26.95 27.6  Systolic 149 121 782  Diastolic 79 70 70  Pulse 66 79 66     Physical Exam Neck:     Vascular: No JVD.  Cardiovascular:     Rate and Rhythm: Normal rate and regular rhythm.     Pulses: Intact distal pulses.     Heart sounds: Normal heart sounds. No murmur heard.    No gallop.  Pulmonary:  Effort: Pulmonary effort is normal.     Breath sounds: Normal breath sounds.  Abdominal:     General: Bowel sounds are normal.     Palpations: Abdomen is soft.  Musculoskeletal:     Right lower leg: No edema.     Left lower leg: No edema.    Laboratory examination:   Recent Labs    12/01/21 0715 06/01/22 0717  NA 141 140  K 5.1 5.1  CL 106 103  CO2 28 28  GLUCOSE 105* 109*  BUN 22 23  CREATININE 1.33 1.31  CALCIUM 9.8 10.1   CrCl cannot be calculated (Patient's most recent lab result is older than the maximum 21 days allowed.).     Latest Ref Rng & Units 06/01/2022    7:17 AM 12/01/2021    7:15 AM 07/24/2021    4:01 PM  CMP  Glucose 70 - 99 mg/dL 841  324  401   BUN 6 - 23 mg/dL 23  22  25    Creatinine 0.40 - 1.50 mg/dL 0.27  2.53  6.64   Sodium 135 - 145 mEq/L 140  141  139   Potassium 3.5 - 5.1 mEq/L 5.1  5.1  4.1   Chloride 96 - 112 mEq/L 103  106  108    CO2 19 - 32 mEq/L 28  28  25    Calcium 8.4 - 10.5 mg/dL 40.3  9.8  9.5   Total Protein 6.0 - 8.3 g/dL 7.5  7.2    Total Bilirubin 0.2 - 1.2 mg/dL 0.9  1.1    Alkaline Phos 39 - 117 U/L 57  50    AST 0 - 37 U/L 21  22    ALT 0 - 53 U/L 18  17        Latest Ref Rng & Units 07/24/2021    4:01 PM 01/15/2021    7:45 AM 07/29/2019    5:50 PM  CBC  WBC 4.0 - 10.5 K/uL 7.1  5.6  7.9   Hemoglobin 13.0 - 17.0 g/dL 47.4  25.9  56.3   Hematocrit 39.0 - 52.0 % 42.4  43.3  45.4   Platelets 150 - 400 K/uL 223  213.0  216     Lipid Panel Recent Labs    12/01/21 0715 06/01/22 0717  CHOL 162 161  TRIG 177.0* 159.0*  LDLCALC 72 71  VLDL 35.4 31.8  HDL 55.30 58.00  CHOLHDL 3 3    HEMOGLOBIN A1C Lab Results  Component Value Date   HGBA1C 6.1 06/01/2022   MPG 123 05/13/2020   TSH Lab Results  Component Value Date   TSH 2.440 07/24/2021    Medications and allergies  No Known Allergies    Current Outpatient Medications:    atorvastatin (LIPITOR) 20 MG tablet, Take 1 tablet (20 mg total) by mouth daily., Disp: 90 tablet, Rfl: 3   B Complex-C (SUPER B COMPLEX PO), Take 1 tablet by mouth daily., Disp: , Rfl:    betamethasone valerate (VALISONE) 0.1 % cream, Apply 1 application topically as needed., Disp: , Rfl:    Cholecalciferol (VITAMIN D3) 5000 units CAPS, Take 1 capsule by mouth daily., Disp: , Rfl:    colchicine 0.6 MG tablet, Take 1 tablet (0.6 mg total) by mouth as needed., Disp: 90 tablet, Rfl: 3   dicyclomine (BENTYL) 10 MG capsule, Take 1 capsule (10 mg total) by mouth 4 (four) times daily -  before meals and at bedtime., Disp: 30 capsule, Rfl: 0  esomeprazole (NEXIUM) 20 MG capsule, Take 20 mg by mouth as needed. , Disp: , Rfl:    flecainide (TAMBOCOR) 50 MG tablet, Take 3 tablets (150 mg total) by mouth once as needed for up to 1 dose., Disp: 60 tablet, Rfl: 0   losartan (COZAAR) 25 MG tablet, Take 1 tablet (25 mg total) by mouth daily., Disp: 90 tablet, Rfl: 3   Magnesium  400 MG CAPS, Take 1 tablet by mouth daily., Disp: , Rfl:    metoprolol succinate (TOPROL-XL) 50 MG 24 hr tablet, Take 1 tablet (50 mg total) by mouth daily., Disp: 90 tablet, Rfl: 3   Multiple Vitamin (MULTIVITAMIN) tablet, Take 1 tablet by mouth daily., Disp: , Rfl:    omeprazole (PRILOSEC) 20 MG capsule, Take 20 mg by mouth daily., Disp: , Rfl:    Pancrelipase, Lip-Prot-Amyl, 24000-76000 units CPEP, Take 1 capsule (24,000 Units total) by mouth 3 (three) times daily. (Patient taking differently: Take 1 capsule by mouth as needed.), Disp: 270 capsule, Rfl: 3   Probiotic Product (PROBIOTIC-10 ULTIMATE) CAPS, Take 1 capsule by mouth., Disp: , Rfl:    Rivaroxaban (XARELTO) 15 MG TABS tablet, Take 1 tablet (15 mg total) by mouth daily with supper., Disp: 90 tablet, Rfl: 1  Radiology:   Chest x-ray PA and lateral view 07/29/2019: The heart size and mediastinal contours are within normal limits. Both lungs are clear. The visualized skeletal structures are unremarkable.  CT angiogram abdomen 04/09/2022 comparison 08/06/2021: Stable 5.3 cm infrarenal abdominal aortic aneurysm. Recommend follow-up every 6 months and vascular consultation.   CT AAA protocol (incl: CT abd pelv angiogram w wo contrast) 06/17/2022: 1. 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   2. A 6 mm pulmonary nodule in the right lower lobe is stable since the  prior CT on 05/11/2022, however new since a remote prior in 2011. Consider  additional follow-up chest CT without contrast in April of 2025 to document stability.   Cardiac Studies:   Exercise Myoview stress test 02/03/2013: 1. Resting EKG showed normal sinus rhythm, LAD, LAFB, IRBBB. Stress EKG was negative for ischemia. Patient exercised on BRUCE PROTOCOL for 9 minutes 6 seconds. The maximum work level  achieved was 9.7 MET's. The test was terminated due to fatigue and achievement of the target heart rate. 2. Perfusion imaging study demonstrates normal perfusion without ischemia or scar. Normal left ventricular ejection fraction. Based on the stress results, continued primary prevention is recommended.  Exercise Treadmill Stress Test 03/07/2018:  Indication: Chest pain   The patient exercised on Bruce protocol for 7:29 min. Patient achieved 9.34  METS and reached HR  128 bpm, which is   89% of maximum age-predicted HR.  Stress test terminated due to fatigue.   Exercise capacity was normal for age. HR Response to Exercise: Appropriate. BP Response to Exercise:  Resting hypertesion with normal exercise response.  Chest Pain: none. Arrhythmias: none. Resting EKG demonstrates Normal sinus rhythm with left anterior fascicular block. ST Changes: With peak exercise there was no ST-T changes of ischemia.   Overall Impression: Normal stress test. Recommendations: Continue primary/secondary prevention.  PCV ECHOCARDIOGRAM COMPLETE 09/19/2020  Left ventricle cavity is normal in size and wall thickness. Normal global wall motion. Normal LV systolic function with EF 65%. Doppler evidence of grade I (impaired) diastolic dysfunction, normal LAP. Trileaflet aortic valve. No  evidence of aortic stenosis. Mild (Grade I) aortic regurgitation. Mild aortic valve leaflet calcification. Mild (Grade I) mitral regurgitation. Moderate tricuspid regurgitation. Estimated pulmonary artery systolic pressure 29 mmHg. Mild pulmonic regurgitation. No significant change compared to previous study on 08/03/2019.   EKG  EKG 09/30/2022: Normal sinus rhythm at rate of 67 bpm, left anterior fascicular block.  Incomplete right bundle branch block.  No evidence of ischemia.  Compared to 07/29/2021, no change.  EKG 07/24/2021: Atrial fibrillation with controlled ventricular response at the rate of 68 bpm, normal axis, poor R  progression, cannot exclude anteroseptal infarct old.  Normal QT interval.  No evidence of ischemia.  Compared to 11/27/2020, normal sinus rhythm, left axis deviation and left anterior fascicular block.   Assessment     ICD-10-CM   1. Primary hypertension  I10     2. History of atrial fibrillation  Z86.79 EKG 12-Lead    3. Status post endovascular aneurysm repair (EVAR) 05/15/22 with bilateral Iliac extension at [facility]  Z98.890 VAS [place] EVAR DUPLEX   Z86.79 CANCELED: AAA Duplex    4. Functional abdominal pain syndrome  R10.9 dicyclomine (BENTYL) 10 MG capsule      CHA2DS2-VASc Score is 4.  Yearly risk of stroke: 4.8% (A, HTN, Aortic atherosclerosis).  Score of 1=0.6; 2=2.2; 3=3.2; 4=4.8; 5=7.2; 6=9.8; 7=>9.8) -(CHF; HTN; vasc disease DM,  Male = 1; Age <65 =0; 65-74 = 1,  >75 =2; stroke/embolism= 2).    Medications Discontinued During This Encounter  Medication Reason   benzonatate (TESSALON) 200 MG capsule    azithromycin (ZITHROMAX) 250 MG tablet      Recommendations:   Yves Disantis  is a 82 y.o. Asian male with hypertension, stage IIIa chronic kidney disease, hyperlipidemia, paroxysmal atrial fibrillation initially diagnosed in 2011 and again on 07/24/2021 when he presented to the emergency room found to be with recurrence of atrial fibrillation with controlled ventricular response.  He has had no recurrence of atrial fibrillation and not on anticoagulation per patient preference and relatively low A-fib burden.  He has been having chronic left lower abdominal discomfort and went to [facility] emergency room where CT scan of the abdomen revealed presence of the aneurysm that had been relatively stable with regard to the size however felt he needs endovascular repair due to abdominal discomfort and underwent EVAR on 05/15/2022 with bilateral iliac extension.  He now presents for follow-up, continues to have episodes of left abdominal discomfort.  1. Primary hypertension Blood pressure is  well-controlled, he is presently on metoprolol and also on losartan, continue the same.  2. History of atrial fibrillation No recurrence of atrial fibrillation not on anticoagulation, uses 50 mg of flecainide on a as needed basis, she has not used as over the past year and a half. - EKG 12-Lead  3. Status post endovascular aneurysm repair (EVAR) 05/15/22 with bilateral Iliac extension at [facility] Reviewed the records from [facility], he will need abdominal aortic duplex as a baseline.  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   He will need vascular surgical follow-up, when I see him back in a year, I will repeat CT angiogram of the abdomen.  I will wait for ultrasound evaluation for now.  - VAS [place] EVAR DUPLEX; Future  4. Functional abdominal pain syndrome Patient's abdominal discomfort appears to be functional.  I will try dicyclomine on a as needed basis.  I am not sure if the abdominal discomfort which appears to be also having some musculoskeletal etiology could be related to radiational pain from spinal stenosis as well. This was a 50-minute office visit encounter.  - dicyclomine (BENTYL) 10 MG capsule; Take 1 capsule (10 mg total) by mouth 4 (four) times daily -  before meals and at bedtime.  Dispense: 30 capsule; Refill: 0  Other orders - omeprazole (PRILOSEC) 20 MG capsule; Take 20 mg by mouth daily.   Yates Decamp, MD, [facility] 10/03/2022, 5:26 PM Office: [phone]

## 2022-10-05 ENCOUNTER — Telehealth (HOSPITAL_COMMUNITY): Payer: Self-pay

## 2022-10-05 DIAGNOSIS — Z23 Encounter for immunization: Secondary | ICD-10-CM | POA: Diagnosis not present

## 2022-10-05 NOTE — Telephone Encounter (Signed)
Called patient to schedule appointment from [facility], patient declined scheduling at this time stating they would call back. At time of call patient was offered appointment dates/times on 9/23+9/26+9/30

## 2022-10-15 DIAGNOSIS — K5641 Fecal impaction: Secondary | ICD-10-CM | POA: Diagnosis not present

## 2022-10-15 DIAGNOSIS — I10 Essential (primary) hypertension: Secondary | ICD-10-CM | POA: Diagnosis not present

## 2022-10-15 DIAGNOSIS — C8299 Follicular lymphoma, unspecified, extranodal and solid organ sites: Secondary | ICD-10-CM | POA: Diagnosis not present

## 2022-10-15 DIAGNOSIS — K644 Residual hemorrhoidal skin tags: Secondary | ICD-10-CM | POA: Diagnosis not present

## 2022-10-15 DIAGNOSIS — K635 Polyp of colon: Secondary | ICD-10-CM | POA: Diagnosis not present

## 2022-10-15 DIAGNOSIS — D122 Benign neoplasm of ascending colon: Secondary | ICD-10-CM | POA: Diagnosis not present

## 2022-10-15 DIAGNOSIS — R1032 Left lower quadrant pain: Secondary | ICD-10-CM | POA: Diagnosis not present

## 2022-10-15 DIAGNOSIS — D123 Benign neoplasm of transverse colon: Secondary | ICD-10-CM | POA: Diagnosis not present

## 2022-10-15 DIAGNOSIS — K640 First degree hemorrhoids: Secondary | ICD-10-CM | POA: Diagnosis not present

## 2022-10-15 DIAGNOSIS — Z980 Intestinal bypass and anastomosis status: Secondary | ICD-10-CM | POA: Diagnosis not present

## 2022-10-15 DIAGNOSIS — R194 Change in bowel habit: Secondary | ICD-10-CM | POA: Diagnosis not present

## 2022-10-28 DIAGNOSIS — N281 Cyst of kidney, acquired: Secondary | ICD-10-CM | POA: Diagnosis not present

## 2022-10-28 DIAGNOSIS — D631 Anemia in chronic kidney disease: Secondary | ICD-10-CM | POA: Diagnosis not present

## 2022-10-28 DIAGNOSIS — I7143 Infrarenal abdominal aortic aneurysm, without rupture: Secondary | ICD-10-CM | POA: Diagnosis not present

## 2022-10-28 DIAGNOSIS — F17211 Nicotine dependence, cigarettes, in remission: Secondary | ICD-10-CM | POA: Diagnosis not present

## 2022-10-28 DIAGNOSIS — N1831 Chronic kidney disease, stage 3a: Secondary | ICD-10-CM | POA: Diagnosis not present

## 2022-10-28 DIAGNOSIS — I714 Abdominal aortic aneurysm, without rupture, unspecified: Secondary | ICD-10-CM | POA: Diagnosis not present

## 2022-10-28 DIAGNOSIS — I129 Hypertensive chronic kidney disease with stage 1 through stage 4 chronic kidney disease, or unspecified chronic kidney disease: Secondary | ICD-10-CM | POA: Diagnosis not present

## 2022-11-02 DIAGNOSIS — C8290 Follicular lymphoma, unspecified, unspecified site: Secondary | ICD-10-CM | POA: Diagnosis not present

## 2022-11-02 DIAGNOSIS — C8599 Non-Hodgkin lymphoma, unspecified, extranodal and solid organ sites: Secondary | ICD-10-CM | POA: Diagnosis not present

## 2022-11-02 DIAGNOSIS — Z1159 Encounter for screening for other viral diseases: Secondary | ICD-10-CM | POA: Diagnosis not present

## 2022-11-02 DIAGNOSIS — Z7289 Other problems related to lifestyle: Secondary | ICD-10-CM | POA: Diagnosis not present

## 2022-11-02 DIAGNOSIS — Z79899 Other long term (current) drug therapy: Secondary | ICD-10-CM | POA: Diagnosis not present

## 2022-11-25 ENCOUNTER — Telehealth: Payer: Self-pay | Admitting: Cardiology

## 2022-11-25 NOTE — Telephone Encounter (Signed)
Tried to reach patient. Phone just rings x2

## 2022-11-25 NOTE — Telephone Encounter (Signed)
Pt states they are trying to see the doctor after their procedure on 11/14. Please advise

## 2022-12-02 ENCOUNTER — Other Ambulatory Visit: Payer: Medicare Other

## 2022-12-02 NOTE — Telephone Encounter (Signed)
I spoke with patient's wife.  Patient had duplex study ordered for 11/14 but patient cancelled as he did not have follow up appointment with Dr Jacinto Halim scheduled after the study.  He has now been scheduled for duplex in March and follow up appointment with Dr Jacinto Halim in March.  I advised patient's wife patient should go ahead and have duplex now and then follow up with Dr Jacinto Halim can be scheduled.  Patient's wife is agreeable with this.

## 2022-12-03 ENCOUNTER — Ambulatory Visit (HOSPITAL_COMMUNITY): Payer: Medicare Other

## 2022-12-07 ENCOUNTER — Other Ambulatory Visit (INDEPENDENT_AMBULATORY_CARE_PROVIDER_SITE_OTHER): Payer: Medicare Other

## 2022-12-07 DIAGNOSIS — R7303 Prediabetes: Secondary | ICD-10-CM | POA: Diagnosis not present

## 2022-12-07 DIAGNOSIS — M1A9XX0 Chronic gout, unspecified, without tophus (tophi): Secondary | ICD-10-CM | POA: Diagnosis not present

## 2022-12-07 DIAGNOSIS — E785 Hyperlipidemia, unspecified: Secondary | ICD-10-CM | POA: Diagnosis not present

## 2022-12-07 LAB — COMPREHENSIVE METABOLIC PANEL
ALT: 19 U/L (ref 0–53)
AST: 25 U/L (ref 0–37)
Albumin: 4.9 g/dL (ref 3.5–5.2)
Alkaline Phosphatase: 59 U/L (ref 39–117)
BUN: 22 mg/dL (ref 6–23)
CO2: 29 meq/L (ref 19–32)
Calcium: 10.3 mg/dL (ref 8.4–10.5)
Chloride: 101 meq/L (ref 96–112)
Creatinine, Ser: 1.34 mg/dL (ref 0.40–1.50)
GFR: 49.55 mL/min — ABNORMAL LOW (ref >60.00)
Glucose, Bld: 116 mg/dL — ABNORMAL HIGH (ref 70–99)
Potassium: 4.8 meq/L (ref 3.5–5.1)
Sodium: 140 meq/L (ref 135–145)
Total Bilirubin: 1.3 mg/dL — ABNORMAL HIGH (ref 0.2–1.2)
Total Protein: 7.7 g/dL (ref 6.0–8.3)

## 2022-12-07 LAB — URIC ACID: Uric Acid, Serum: 8.4 mg/dL — ABNORMAL HIGH (ref 4.0–7.8)

## 2022-12-07 LAB — LIPID PANEL
Cholesterol: 193 mg/dL (ref 0–200)
HDL: 64.8 mg/dL (ref >39.00)
LDL Cholesterol: 93 mg/dL (ref 0–99)
NonHDL: 128.42
Total CHOL/HDL Ratio: 3
Triglycerides: 175 mg/dL — ABNORMAL HIGH (ref 0.0–149.0)
VLDL: 35 mg/dL (ref 0.0–40.0)

## 2022-12-07 LAB — HEMOGLOBIN A1C: Hgb A1c MFr Bld: 6.1 % (ref 4.6–6.5)

## 2022-12-07 NOTE — Telephone Encounter (Signed)
Duplex has been scheduled for 12/16/22.  I spoke with patient's wife and scheduled patient to see Dr Jacinto Halim on 12/30/22 at 11:00

## 2022-12-09 ENCOUNTER — Encounter: Payer: Self-pay | Admitting: Family Medicine

## 2022-12-09 ENCOUNTER — Ambulatory Visit (INDEPENDENT_AMBULATORY_CARE_PROVIDER_SITE_OTHER): Payer: Medicare Other | Admitting: Family Medicine

## 2022-12-09 VITALS — BP 126/74 | HR 67 | Temp 98.0°F | Resp 16 | Ht 67.0 in | Wt 182.0 lb

## 2022-12-09 DIAGNOSIS — M1A9XX0 Chronic gout, unspecified, without tophus (tophi): Secondary | ICD-10-CM

## 2022-12-09 DIAGNOSIS — R7303 Prediabetes: Secondary | ICD-10-CM

## 2022-12-09 DIAGNOSIS — E785 Hyperlipidemia, unspecified: Secondary | ICD-10-CM | POA: Diagnosis not present

## 2022-12-09 DIAGNOSIS — I10 Essential (primary) hypertension: Secondary | ICD-10-CM

## 2022-12-09 MED ORDER — ALLOPURINOL 100 MG PO TABS: 100.0000 mg | ORAL_TABLET | Freq: Every day | ORAL | 6 refills | Status: DC

## 2022-12-09 MED ORDER — COLCHICINE 0.6 MG PO TABS: ORAL_TABLET | ORAL | 1 refills | Status: AC

## 2022-12-09 NOTE — Patient Instructions (Addendum)
Keep the diet clean and stay active.  Please consider getting your tetanus booster at the pharmacy.  Let us know if you need anything.

## 2022-12-09 NOTE — Progress Notes (Signed)
Chief Complaint  Patient presents with   Follow-up    Follow up    Subjective Brandon Bradford is a 82 y.o. male who presents for hypertension follow up. Here w wife.  He does monitor home blood pressures. Blood pressures ranging from 120-130's/70's on average. He is compliant with medications- losartan 25 mg/d, Toprol XL 50 mg/d. Patient has these side effects of medication: none He is adhering to a healthy diet overall. Current exercise: walking, golf No CP or SOB.   Hyperlipidemia Patient presents for dyslipidemia follow up. Currently being treated with Lipitor 20 mg/d and compliance with treatment thus far has been good. He denies myalgias. Diet/exercise as above.  The patient is not known to have coexisting coronary artery disease.  Prediabetes Patient has a history of prediabetes.  Last A1c was 6.1.  He is not on any glucose lowering medications.  Gout Patient has a history of gout, most recent uric acid was 8.4.  He is having near monthly flares of his right great toe.  He takes colchicine as needed which is effective.  He is not taking a maintenance medication.  Diet as above.   Past Medical History:  Diagnosis Date   AAA (abdominal aortic aneurysm) (HCC)    3.4 cm   Arthritis    Cataract 06/2016   bilateral cataract extraction   Clotting disorder (HCC)    Colitis, ischemic (HCC)    Gastritis    GERD (gastroesophageal reflux disease)    Hx of colonic polyps    Hyperlipidemia    Hypertension    Paroxysmal atrial fibrillation (HCC)    Vertigo, intermittent     Exam BP 126/74 (BP Location: Left Arm, Patient Position: Sitting, Cuff Size: Normal)   Pulse 67   Temp 98 F (36.7 C) (Oral)   Resp 16   Ht 5\' 7"  (1.702 m)   Wt 182 lb (82.6 kg)   SpO2 95%   BMI 28.51 kg/m  General:  well developed, well nourished, in no apparent distress Heart: RRR, no bruits, 1+ pitting bilateral LE edema tapering at the mid tibia Lungs: clear to auscultation, no accessory muscle  use Psych: well oriented with normal range of affect and appropriate judgment/insight  Essential hypertension - Plan: CBC, Comprehensive metabolic panel, Lipid panel  Hyperlipidemia, unspecified hyperlipidemia type  Prediabetes - Plan: Hemoglobin A1c  Chronic gout involving toe of right foot without tophus, unspecified cause - Plan: Uric acid, Uric acid  Chronic gout without tophus, unspecified cause, unspecified site  Chronic, stable.  Continue losartan 25 mg daily, Toprol-XL 50 mg daily.  Counseled on diet and exercise. Chronic, stable.  Continue Lipitor 20 mg daily. Check above routinely. Chronic, uncontrolled.  Check uric acid in 1 month.  Start allopurinol 100 mg daily.  Continue colchicine 0.6 mg as needed. F/u in 6 mo for med check otherwise. The patient and his wife voiced understanding and agreement to the plan.  Jilda [facility], DO 12/09/22  2:15 PM

## 2022-12-16 ENCOUNTER — Ambulatory Visit (HOSPITAL_COMMUNITY)
Admission: RE | Admit: 2022-12-16 | Discharge: 2022-12-16 | Disposition: A | Discharge status: Home / Self Care | Payer: Medicare Other | Source: Ambulatory Visit | Attending: Cardiology | Admitting: Cardiology

## 2022-12-16 DIAGNOSIS — Z48812 Encounter for surgical aftercare following surgery on the circulatory system: Secondary | ICD-10-CM | POA: Diagnosis not present

## 2022-12-16 DIAGNOSIS — Z8679 Personal history of other diseases of the circulatory system: Secondary | ICD-10-CM | POA: Diagnosis not present

## 2022-12-16 DIAGNOSIS — Z9889 Other specified postprocedural states: Secondary | ICD-10-CM | POA: Insufficient documentation

## 2022-12-17 NOTE — Progress Notes (Signed)
Successful repair of AAA.  Mild endoleak noted, will repeat aortic duplex in 6 months.  Patient has an appointment to see me in next 2 weeks we will discuss more on his visit.

## 2022-12-30 ENCOUNTER — Ambulatory Visit: Payer: Medicare Other | Attending: Cardiology | Admitting: Cardiology

## 2022-12-30 ENCOUNTER — Encounter: Payer: Self-pay | Admitting: Cardiology

## 2022-12-30 VITALS — BP 140/82 | HR 77 | Resp 16 | Ht 67.0 in | Wt 179.8 lb

## 2022-12-30 DIAGNOSIS — Z8679 Personal history of other diseases of the circulatory system: Secondary | ICD-10-CM

## 2022-12-30 DIAGNOSIS — R1314 Dysphagia, pharyngoesophageal phase: Secondary | ICD-10-CM | POA: Diagnosis not present

## 2022-12-30 DIAGNOSIS — I480 Paroxysmal atrial fibrillation: Secondary | ICD-10-CM

## 2022-12-30 DIAGNOSIS — C8590 Non-Hodgkin lymphoma, unspecified, unspecified site: Secondary | ICD-10-CM | POA: Diagnosis not present

## 2022-12-30 DIAGNOSIS — Z9889 Other specified postprocedural states: Secondary | ICD-10-CM | POA: Diagnosis not present

## 2022-12-30 DIAGNOSIS — I10 Essential (primary) hypertension: Secondary | ICD-10-CM | POA: Diagnosis not present

## 2022-12-30 MED ORDER — FLECAINIDE ACETATE 50 MG PO TABS
150.0000 mg | ORAL_TABLET | ORAL | 1 refills | Status: DC
Start: 2022-12-30 — End: 2023-01-07

## 2022-12-30 MED ORDER — RIVAROXABAN 15 MG PO TABS: 15.0000 mg | ORAL_TABLET | Freq: Every day | ORAL | 3 refills | Status: DC

## 2022-12-30 NOTE — Progress Notes (Signed)
Cardiology Office Note:  .   Date:  12/30/2022  ID:  Brandon Bradford, DOB [birth year], MRN [id number] PCP: Sharlene Dory, DO  [facility] Providers Cardiologist:  Yates Decamp, MD   History of Present Illness: .   Brandon Bradford is a 82 y.o. Asian male with hypertension, stage IIIa chronic kidney disease, hyperlipidemia, paroxysmal atrial fibrillation presently well-managed with flecainide and Xarelto, he underwent EVAR on 05/15/2022 with bilateral iliac extension.  Patient requested a urgent consultation to discuss his coordination of care and his concerns for overall health and recurrence of palpitations.  Patient has discontinued flecainide and Xarelto and using it on a as needed basis.  Discussed the use of AI scribe software for clinical note transcription with the patient, who gave verbal consent to proceed.  History of Present Illness   The patient, with a history of abdominal aortic aneurysm and recent diagnosis of follicular lymphoma, presents with difficulty swallowing and occasional rapid heartbeat. The patient's spouse reports that the patient has been experiencing these symptoms for some time, with episodes of rapid heartbeat occurring at night, causing the patient to wake up and walk around to alleviate the symptoms.  He takes as needed flecainide with resolution of symptoms within 30 minutes or so.  Last episode was last night.  He has not been taking Xarelto as well and takes it only when he has palpitations.  The patient also reports feeling like food gets stuck in his throat when eating, causing discomfort. The patient's spouse expresses concern about the patient's health and is seeking advice on the next steps. The patient is reluctant to undergo further testing and prefers a more holistic approach to his health.      Review of Systems  Cardiovascular:  Positive for palpitations. Negative for chest pain, dyspnea on exertion and leg swelling.  Gastrointestinal:  Positive for  dysphagia and heartburn.    Labs   Lab Results  Component Value Date   CHOL 193 12/07/2022   HDL 64.80 12/07/2022   LDLCALC 93 12/07/2022   TRIG 175.0 (H) 12/07/2022   CHOLHDL 3 12/07/2022   Lab Results  Component Value Date   NA 140 12/07/2022   K 4.8 12/07/2022   CO2 29 12/07/2022   GLUCOSE 116 (H) 12/07/2022   BUN 22 12/07/2022   CREATININE 1.34 12/07/2022   CALCIUM 10.3 12/07/2022   GFR 49.55 (L) 12/07/2022   GFRNONAA 45 (L) 07/24/2021      Latest Ref Rng & Units 12/07/2022    7:53 AM 06/01/2022    7:17 AM 12/01/2021    7:15 AM  BMP  Glucose 70 - 99 mg/dL 638  756  433   BUN 6 - 23 mg/dL 22  23  22    Creatinine 0.40 - 1.50 mg/dL 2.95  1.88  4.16   Sodium 135 - 145 mEq/L 140  140  141   Potassium 3.5 - 5.1 mEq/L 4.8  5.1  5.1   Chloride 96 - 112 mEq/L 101  103  106   CO2 19 - 32 mEq/L 29  28  28    Calcium 8.4 - 10.5 mg/dL 60.6  30.1  9.8       Latest Ref Rng & Units 07/24/2021    4:01 PM 01/15/2021    7:45 AM 07/29/2019    5:50 PM  CBC  WBC 4.0 - 10.5 K/uL 7.1  5.6  7.9   Hemoglobin 13.0 - 17.0 g/dL 60.1  09.3  23.5   Hematocrit 39.0 -  52.0 % 42.4  43.3  45.4   Platelets 150 - 400 K/uL 223  213.0  216     Physical Exam:   VS:  BP (!) 140/82 (BP Location: Left Arm, Patient Position: Sitting, Cuff Size: Normal)   Pulse 77   Resp 16   Ht 5\' 7"  (1.702 m)   Wt 179 lb 12.8 oz (81.6 kg)   SpO2 94%   BMI 28.16 kg/m    Wt Readings from Last 3 Encounters:  12/30/22 179 lb 12.8 oz (81.6 kg)  12/09/22 182 lb (82.6 kg)  09/30/22 182 lb (82.6 kg)     Physical Exam Neck:     Vascular: No carotid bruit or JVD.  Cardiovascular:     Rate and Rhythm: Normal rate and regular rhythm.     Pulses: Intact distal pulses.     Heart sounds: Normal heart sounds. No murmur heard.    No gallop.  Pulmonary:     Effort: Pulmonary effort is normal.     Breath sounds: Normal breath sounds.  Abdominal:     General: Bowel sounds are normal.     Palpations: Abdomen is soft.   Musculoskeletal:     Right lower leg: No edema.     Left lower leg: No edema.     Studies Reviewed: .    CT AAA protocol (incl: CT abd pelv angiogram w wo contrast) 06/17/2022: 1. 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  2. A 6 mm pulmonary nodule in the right lower lobe is stable since the  prior CT on 05/11/2022, however new since a remote prior in 2011. Consider  additional follow-up chest CT without contrast in April of 2025 to document stability.   Abdominal Aortic Duplex 12/16/2022   Abdominal Aorta: The largest aortic measurement is 3.9 cm. Patent  endovascular aneurysm repair with evidence of endoleak. The largest aortic  diameter has decreased compared to prior exam. Previous diameter  measurement was 4.2 cm obtained on 09/12/2021.   EKG:    EKG 09/30/2022: Normal sinus rhythm at rate of 67 bpm, left anterior fascicular block. Incomplete right bundle branch block. No evidence of ischemia.   Medications and allergies    No Known Allergies   Current Outpatient Medications:    atorvastatin (LIPITOR) 20 MG tablet, Take 1 tablet (20 mg total) by mouth daily., Disp: 90 tablet, Rfl: 3   B Complex-C (SUPER B COMPLEX PO), Take 1 tablet by mouth daily., Disp: , Rfl:    betamethasone valerate (VALISONE) 0.1 % cream, Apply 1 application topically as needed., Disp: , Rfl:    Cholecalciferol (VITAMIN D3) 5000 units CAPS, Take 1 capsule by mouth daily., Disp: , Rfl:    colchicine 0.6 MG tablet, Take 2 tabs at first sign of a flare and repeat 1 tab 1 hr later. Take 1 tab twice daily until flare resolves., Disp: 90 tablet, Rfl: 1   dicyclomine (BENTYL) 10 MG capsule, Take 1 capsule (10 mg total) by mouth 4 (four) times daily -  before meals and at bedtime., Disp: 30 capsule, Rfl: 0   esomeprazole (NEXIUM) 20 MG capsule,  Take 20 mg by mouth as needed. , Disp: , Rfl:    Ginkgo Biloba 40 MG TABS, Take 1 tablet by mouth daily., Disp: , Rfl:    Glucosamine-Chondroitin 1500-1200 MG/30ML LIQD, Take 1  tablet by mouth daily., Disp: , Rfl:    losartan (COZAAR) 25 MG tablet, Take 1 tablet (25 mg total) by mouth daily., Disp: 90 tablet, Rfl: 3   Magnesium 400 MG CAPS, Take 1 tablet by mouth daily., Disp: , Rfl:    metoprolol succinate (TOPROL-XL) 50 MG 24 hr tablet, Take 1 tablet (50 mg total) by mouth daily., Disp: 90 tablet, Rfl: 3   Multiple Vitamin (MULTIVITAMIN) tablet, Take 1 tablet by mouth daily., Disp: , Rfl:    omeprazole (PRILOSEC) 20 MG capsule, Take 20 mg by mouth daily., Disp: , Rfl:    Pancrelipase, Lip-Prot-Amyl, 24000-76000 units CPEP, Take 1 capsule (24,000 Units total) by mouth 3 (three) times daily. (Patient taking differently: Take 1 capsule by mouth as needed.), Disp: 270 capsule, Rfl: 3   Probiotic Product (PROBIOTIC-10 ULTIMATE) CAPS, Take 1 capsule by mouth., Disp: , Rfl:    allopurinol (ZYLOPRIM) 100 MG tablet, Take 1 tablet (100 mg total) by mouth daily. (Patient not taking: Reported on 12/30/2022), Disp: 30 tablet, Rfl: 6   flecainide (TAMBOCOR) 50 MG tablet, Take 3 tablets (150 mg total) by mouth as directed for 1 dose. Take 2 tablets once if heart racing occurs, Disp: 30 tablet, Rfl: 1   Rivaroxaban (XARELTO) 15 MG TABS tablet, Take 1 tablet (15 mg total) by mouth daily with supper., Disp: 90 tablet, Rfl: 3   ASSESSMENT AND PLAN: .      ICD-10-CM   1. Primary hypertension  I10     2. Status post endovascular aneurysm repair (EVAR) 05/15/22 with bilateral Iliac extension at [facility]  Z98.890    Z86.79     3. Paroxysmal atrial fibrillation (HCC)  I48.0 flecainide (TAMBOCOR) 50 MG tablet    Rivaroxaban (XARELTO) 15 MG TABS tablet    4. Non-Hodgkin's lymphoma, unspecified body region, unspecified non-Hodgkin lymphoma type [facility])  C85.90 Ambulatory referral to Hematology / Oncology    Ambulatory  referral to Gastroenterology    5. Pharyngoesophageal dysphagia  R13.14 Ambulatory referral to Gastroenterology    Click Here to Calculate/Change CHADS2VASc Score The patient's CHADS2-VASc score is 4, indicating a 4.8% annual risk of stroke.  Therefore, anticoagulation is recommended.   CHF History: No HTN History: Yes Diabetes History: No Stroke History: No Vascular Disease History: Yes   1. Primary hypertension Blood pressure was elevated, usually blood pressure is very well-controlled on metoprolol succinate and losartan 25 mg daily.  Today he was extremely stressed regarding new diagnosis of potential follicular lymphoma and overall health.  2. Status post endovascular aneurysm repair (EVAR) 05/15/22 with bilateral Iliac extension at [facility] I reviewed the results of the recently performed dominated duplex, he will need repeat abdominal duplex in 1 year.  I will order this on his next office visit in 2 to 3 months to close the gap as have made referrals as dictated below.  3.  Paroxysmal atrial fibrillation Patient has discontinued taking flecainide 50 mg twice daily.  He has been taking 50 mg once a day on a as needed basis depending upon his onset of palpitations.  His chads vascular score is also high and he has been taking Xarelto only on a as needed basis as well.  Clear-cut instructions regarding use of 100 mg of flecainide with onset of atrial fibrillation and to start Xarelto on a daily basis, Rx was sent.  Patient's wife is present and all questions answered.    Follicular Lymphoma Diagnosed on 11/02/2022 via colonoscopy biopsy. No PET scan performed yet. Patient  prefers a holistic approach and is hesitant about further testing and treatment. -Refer to oncology for a second opinion and further evaluation as deemed necessary, I reviewed the oncology notes from [facility] and reviewed the CT scan as well.  Dysphagia Reports difficulty swallowing and sensation of  food getting stuck. No upper GI endoscopy performed yet.  There is also question about whether he may have duodenal involvement with follicular cancer as dictated by [facility] oncology team. -Refer to local GI specialist for evaluation.  Gout Occasional episodes. Concerns about medication impact on kidney function. -Continue Allopurinol for gout prevention, reassured patient about safety in relation to kidney function.  Follow-up in 2-3 months to reassess and coordinate care.  Total time spent was 42 minutes to review his external records, coordination of care.  Signed,  Yates Decamp, MD, [facility] 12/30/2022, 5:51 PM [facility] [address] [place], [place] [address] Phone: [phone]. Fax:  [phone]

## 2022-12-30 NOTE — Progress Notes (Signed)
Referral received for transfer of care from [facility]. Called to confirm, spoke with wife who confirmed that patient wanted to transfer to [facility]. Scheduler will call to make appointment. Will continue to monitor.

## 2022-12-30 NOTE — Patient Instructions (Addendum)
Medication Instructions:  The current medical regimen is effective;  continue present plan and medications.  *If you need a refill on your cardiac medications before your next appointment, please call your pharmacy*   Follow-Up: At [facility], you and your health needs are our priority.  As part of our continuing mission to provide you with exceptional heart care, we have created designated Provider Care Teams.  These Care Teams include your primary Cardiologist (physician) and Advanced Practice Providers (APPs -  Physician Assistants and Nurse Practitioners) who all work together to provide you with the care you need, when you need it.  We recommend signing up for the patient portal called "MyChart".  Sign up information is provided on this After Visit Summary.  MyChart is used to connect with patients for Virtual Visits (Telemedicine).  Patients are able to view lab/test results, encounter notes, upcoming appointments, etc.  Non-urgent messages can be sent to your provider as well.   To learn more about what you can do with MyChart, go to [email or web address].    Your next appointment:   3 month(s)  Provider:   Dr Sherilyn Banker

## 2022-12-30 NOTE — Progress Notes (Signed)
Received referral from Dr Nadara Eaton, Cardiology. Discussed with Dr Leonides Schanz, Pt had been seen at [facility] for work up in October. Called and spoke with wife who stated that patient would like to transfer care to [facility] in [place]. Will send to scheduler to make appointment.

## 2023-01-07 ENCOUNTER — Other Ambulatory Visit: Payer: Self-pay | Admitting: Cardiology

## 2023-01-07 DIAGNOSIS — I480 Paroxysmal atrial fibrillation: Secondary | ICD-10-CM

## 2023-01-08 ENCOUNTER — Ambulatory Visit: Payer: Medicare Other | Admitting: Family Medicine

## 2023-01-27 ENCOUNTER — Ambulatory Visit: Payer: Medicare Other | Admitting: Family Medicine

## 2023-02-01 ENCOUNTER — Other Ambulatory Visit: Payer: Self-pay | Admitting: Family Medicine

## 2023-02-01 MED ORDER — ALLOPURINOL 100 MG PO TABS: 100.0000 mg | ORAL_TABLET | Freq: Every day | ORAL | 3 refills | Status: DC

## 2023-02-25 ENCOUNTER — Telehealth: Payer: Self-pay

## 2023-02-25 NOTE — Telephone Encounter (Signed)
 Copied from CRM [id number]. Topic: Referral - Status >> Feb 25, 2023 10:11 AM Nestora J wrote: Reason for CRM: [facility] stated they Made contact with pt who stated he was traveling in December, but was unable to make further contact with the pt to get him scheduled and is closing out the referral

## 2023-03-24 NOTE — Progress Notes (Signed)
 Spoke to wife, they were contacted in December by the scheduler and told her that they would be out of town until February, she told me they were still not sure what they wanted to do. I explained to her that she can call anytime and we will make an appointment once they decide, but I am deferring the referral at this time.

## 2023-03-25 ENCOUNTER — Other Ambulatory Visit: Payer: Self-pay | Admitting: Family Medicine

## 2023-03-25 DIAGNOSIS — E785 Hyperlipidemia, unspecified: Secondary | ICD-10-CM

## 2023-03-25 DIAGNOSIS — I10 Essential (primary) hypertension: Secondary | ICD-10-CM

## 2023-03-29 ENCOUNTER — Other Ambulatory Visit (HOSPITAL_COMMUNITY): Payer: Medicare Other

## 2023-03-31 ENCOUNTER — Ambulatory Visit: Payer: Medicare Other | Admitting: Cardiology

## 2023-04-13 ENCOUNTER — Encounter: Payer: Self-pay | Admitting: Cardiology

## 2023-04-13 ENCOUNTER — Ambulatory Visit: Payer: Medicare Other | Attending: Cardiology | Admitting: Cardiology

## 2023-04-13 VITALS — BP 142/86 | HR 65 | Resp 16 | Ht 67.0 in | Wt 182.0 lb

## 2023-04-13 DIAGNOSIS — I714 Abdominal aortic aneurysm, without rupture, unspecified: Secondary | ICD-10-CM | POA: Diagnosis not present

## 2023-04-13 DIAGNOSIS — I480 Paroxysmal atrial fibrillation: Secondary | ICD-10-CM | POA: Insufficient documentation

## 2023-04-13 DIAGNOSIS — I10 Essential (primary) hypertension: Secondary | ICD-10-CM | POA: Insufficient documentation

## 2023-04-13 DIAGNOSIS — E7800 Pure hypercholesterolemia, unspecified: Secondary | ICD-10-CM | POA: Insufficient documentation

## 2023-04-13 DIAGNOSIS — Z9889 Other specified postprocedural states: Secondary | ICD-10-CM | POA: Diagnosis not present

## 2023-04-13 DIAGNOSIS — Z8679 Personal history of other diseases of the circulatory system: Secondary | ICD-10-CM | POA: Diagnosis not present

## 2023-04-13 MED ORDER — AMLODIPINE BESYLATE 5 MG PO TABS
5.0000 mg | ORAL_TABLET | Freq: Every day | ORAL | 3 refills | Status: DC
Start: 2023-04-13 — End: 2023-09-10

## 2023-04-13 NOTE — Patient Instructions (Addendum)
 Medication Instructions:  Start amlodipine 5 mg by mouth daily.  *If you need a refill on your cardiac medications before your next appointment, please call your pharmacy*   Lab Work: none If you have labs (blood work) drawn today and your tests are completely normal, you will receive your results only by: MyChart Message (if you have MyChart) OR A paper copy in the mail If you have any lab test that is abnormal or we need to change your treatment, we will call you to review the results.   Testing/Procedures: Your physician has requested that you have an abdominal aorta duplex in December 2025. During this test, an ultrasound is used to evaluate the aorta. Allow 30 minutes for this exam. Do not eat after midnight the day before and avoid carbonated beverages.  Please note: We ask at that you not bring children with you during ultrasound (echo/ vascular) testing. Due to room size and safety concerns, children are not allowed in the ultrasound rooms during exams. Our front office staff cannot provide observation of children in our lobby area while testing is being conducted. An adult accompanying a patient to their appointment will only be allowed in the ultrasound room at the discretion of the ultrasound technician under special circumstances. We apologize for any inconvenience.    Follow-Up: At [facility], you and your health needs are our priority.  As part of our continuing mission to provide you with exceptional heart care, we have created designated Provider Care Teams.  These Care Teams include your primary Cardiologist (physician) and Advanced Practice Providers (APPs -  Physician Assistants and Nurse Practitioners) who all work together to provide you with the care you need, when you need it.  We recommend signing up for the patient portal called "MyChart".  Sign up information is provided on this After Visit Summary.  MyChart is used to connect with patients for Virtual  Visits (Telemedicine).  Patients are able to view lab/test results, encounter notes, upcoming appointments, etc.  Non-urgent messages can be sent to your provider as well.   To learn more about what you can do with MyChart, go to [email or web address].    Your next appointment:   9 month(s)  Provider:   Yates Decamp, MD     Other Instructions Your goal blood pressure is 130/80    1st Floor: - Lobby - Registration  - Pharmacy  - Lab - Cafe  2nd Floor: - PV Lab - Diagnostic Testing (echo, CT, nuclear med)  3rd Floor: - Vacant  4th Floor: - TCTS (cardiothoracic surgery) - AFib Clinic - Structural Heart Clinic - Vascular Surgery  - Vascular Ultrasound  5th Floor: - HeartCare Cardiology (general and EP) - Clinical Pharmacy for coumadin, hypertension, lipid, weight-loss medications, and med management appointments    Valet parking services will be available as well.

## 2023-04-13 NOTE — Progress Notes (Signed)
 Cardiology Office Note:  .   Date:  04/13/2023  ID:  Brandon Bradford, DOB [birth year], MRN [id number] PCP: Sharlene Dory, DO  [facility] Providers Cardiologist:  Yates Decamp, MD   History of Present Illness: .   Brandon Bradford is a 83 y.o.  Asian male with hypertension, stage IIIa chronic kidney disease, hyperlipidemia, paroxysmal atrial fibrillation presently well-managed with flecainide and Xarelto, he underwent EVAR on 05/15/2022 with bilateral iliac extension.  He was also diagnosed with non-Hodgkin's lymphoma at [facility] and was also having some difficulty with swallowing with pharyngoesophageal dysphagia, he was referred for hematology and also GI when he was seen last by me on 12/30/2022.  This is a 25-month office visit to close the gap.  Discussed the use of AI scribe software for clinical note transcription with the patient, who gave verbal consent to proceed.  History of Present Illness   The patient, with a history of lymphoma and aneurysm repair, presents for a follow-up visit. He had been experiencing difficulty swallowing, which has since resolved. The patient has decided against further treatment for his lymphoma and is currently asymptomatic. He is enjoying his daily activities and feels back to normal. The patient also has a history of high blood pressure, which he monitors at home. He has not checked his blood pressure in a couple of months as he has been feeling fine. Occasionally, he experiences a stomach ache and changes in heart rate.       Labs   Lab Results  Component Value Date   CHOL 193 12/07/2022   HDL 64.80 12/07/2022   LDLCALC 93 12/07/2022   TRIG 175.0 (H) 12/07/2022   CHOLHDL 3 12/07/2022   Lab Results  Component Value Date   NA 140 12/07/2022   K 4.8 12/07/2022   CO2 29 12/07/2022   GLUCOSE 116 (H) 12/07/2022   BUN 22 12/07/2022   CREATININE 1.34 12/07/2022   CALCIUM 10.3 12/07/2022   GFR 49.55 (L) 12/07/2022   GFRNONAA 45  (L) 07/24/2021      Latest Ref Rng & Units 12/07/2022    7:53 AM 06/01/2022    7:17 AM 12/01/2021    7:15 AM  BMP  Glucose 70 - 99 mg/dL 409  811  914   BUN 6 - 23 mg/dL 22  23  22    Creatinine 0.40 - 1.50 mg/dL 7.82  9.56  2.13   Sodium 135 - 145 mEq/L 140  140  141   Potassium 3.5 - 5.1 mEq/L 4.8  5.1  5.1   Chloride 96 - 112 mEq/L 101  103  106   CO2 19 - 32 mEq/L 29  28  28    Calcium 8.4 - 10.5 mg/dL 08.6  57.8  9.8       Latest Ref Rng & Units 07/24/2021    4:01 PM 01/15/2021    7:45 AM 07/29/2019    5:50 PM  CBC  WBC 4.0 - 10.5 K/uL 7.1  5.6  7.9   Hemoglobin 13.0 - 17.0 g/dL 46.9  62.9  52.8   Hematocrit 39.0 - 52.0 % 42.4  43.3  45.4   Platelets 150 - 400 K/uL 223  213.0  216    Lab Results  Component Value Date   HGBA1C 6.1 12/07/2022    Lab Results  Component Value Date   TSH 2.440 07/24/2021    Review of Systems  Cardiovascular:  Negative for chest pain, dyspnea on exertion and leg swelling.  Physical Exam:   VS:  BP (!) 142/86 (BP Location: Left Arm, Patient Position: Sitting, Cuff Size: Normal)   Pulse 65   Resp 16   Ht 5\' 7"  (1.702 m)   Wt 182 lb (82.6 kg)   SpO2 96%   BMI 28.51 kg/m    Wt Readings from Last 3 Encounters:  04/13/23 182 lb (82.6 kg)  12/30/22 179 lb 12.8 oz (81.6 kg)  12/09/22 182 lb (82.6 kg)       04/13/2023   10:22 AM 12/30/2022   11:15 AM 12/09/2022    1:40 PM  Vitals with BMI  Height 5\' 7"  5\' 7"  5\' 7"   Weight 182 lbs 179 lbs 13 oz 182 lbs  BMI 28.5 28.15 28.5  Systolic 142 140 130  Diastolic 86 82 74  Pulse 65 77 67   Physical Exam Neck:     Vascular: No carotid bruit or JVD.  Cardiovascular:     Rate and Rhythm: Normal rate and regular rhythm.     Pulses: Intact distal pulses.     Heart sounds: Normal heart sounds. No murmur heard.    No gallop.  Pulmonary:     Effort: Pulmonary effort is normal.     Breath sounds: Normal breath sounds.  Abdominal:     General: Bowel sounds are normal.     Palpations:  Abdomen is soft.  Musculoskeletal:     Right lower leg: No edema.     Left lower leg: No edema.    Studies Reviewed: Marland Kitchen    Abdominal Aortic Duplex 12/16/2022   Abdominal Aorta: The largest aortic measurement is 3.9 cm. Patent  endovascular aneurysm repair with evidence of endoleak. The largest aortic diameter has decreased compared to prior exam. Previous diameter  measurement was 4.2 cm obtained on 09/12/2021.  EKG:    EKG Interpretation Date/Time:  Tuesday April 13 2023 10:25:26 EDT Ventricular Rate:  64 PR Interval:  132 QRS Duration:  108 QT Interval:  416 QTC Calculation: 429 R Axis:   -32  Text Interpretation: EKG 04/13/2023: Normal sinus rhythm at rate of 64 bpm, left anterior fascicular block.  No evidence of ischemia, normal QT interval.  Compared to 07/24/2021, atrial fibrillation has been replaced.  Compared to 09/30/2022, no change. Confirmed by Delrae Rend [phone]) on 04/13/2023 9:02:15 PM     Medications and allergies    No Known Allergies   Current Outpatient Medications:    allopurinol (ZYLOPRIM) 100 MG tablet, Take 1 tablet (100 mg total) by mouth daily., Disp: 90 tablet, Rfl: 3   amLODipine (NORVASC) 5 MG tablet, Take 1 tablet (5 mg total) by mouth daily., Disp: 90 tablet, Rfl: 3   atorvastatin (LIPITOR) 20 MG tablet, TAKE 1 TABLET BY MOUTH DAILY, Disp: 90 tablet, Rfl: 3   B Complex-C (SUPER B COMPLEX PO), Take 1 tablet by mouth daily., Disp: , Rfl:    betamethasone valerate (VALISONE) 0.1 % cream, Apply 1 application topically as needed., Disp: , Rfl:    Cholecalciferol (VITAMIN D3) 5000 units CAPS, Take 1 capsule by mouth daily., Disp: , Rfl:    colchicine 0.6 MG tablet, Take 2 tabs at first sign of a flare and repeat 1 tab 1 hr later. Take 1 tab twice daily until flare resolves., Disp: 90 tablet, Rfl: 1   dicyclomine (BENTYL) 10 MG capsule, Take 1 capsule (10 mg total) by mouth 4 (four) times daily -  before meals and at bedtime., Disp: 30 capsule, Rfl: 0  esomeprazole (NEXIUM) 20 MG capsule, Take 20 mg by mouth as needed. , Disp: , Rfl:    flecainide (TAMBOCOR) 50 MG tablet, TAKE 3 TABLETS BY MOUTH AS DIRECTED FOR 1 DOSE. TAKE 2 TABLETS ONCE IF HEART RACING OCCURS, Disp: 180 tablet, Rfl: 1   Ginkgo Biloba 40 MG TABS, Take 1 tablet by mouth daily., Disp: , Rfl:    Glucosamine-Chondroitin 1500-1200 MG/30ML LIQD, Take 1 tablet by mouth daily., Disp: , Rfl:    losartan (COZAAR) 25 MG tablet, TAKE 1 TABLET BY MOUTH DAILY, Disp: 90 tablet, Rfl: 3   Magnesium 400 MG CAPS, Take 1 tablet by mouth daily., Disp: , Rfl:    metoprolol succinate (TOPROL-XL) 50 MG 24 hr tablet, Take 1 tablet (50 mg total) by mouth daily., Disp: 90 tablet, Rfl: 3   Multiple Vitamin (MULTIVITAMIN) tablet, Take 1 tablet by mouth daily., Disp: , Rfl:    omeprazole (PRILOSEC) 20 MG capsule, Take 20 mg by mouth daily., Disp: , Rfl:    Pancrelipase, Lip-Prot-Amyl, 24000-76000 units CPEP, Take 1 capsule (24,000 Units total) by mouth 3 (three) times daily. (Patient taking differently: Take 1 capsule by mouth as needed.), Disp: 270 capsule, Rfl: 3   Probiotic Product (PROBIOTIC-10 ULTIMATE) CAPS, Take 1 capsule by mouth., Disp: , Rfl:    Rivaroxaban (XARELTO) 15 MG TABS tablet, Take 1 tablet (15 mg total) by mouth daily with supper., Disp: 90 tablet, Rfl: 3   ASSESSMENT AND PLAN: .      ICD-10-CM   1. Primary hypertension  I10 EKG 12-Lead    amLODipine (NORVASC) 5 MG tablet    2. Paroxysmal atrial fibrillation (HCC)  I48.0     3. Hypercholesteremia  E78.00     4. 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 Abdominal aortic aneurysm (AAA) without rupture, unspecified part (HCC)  I71.40 VAS [place] AAA DUPLEX      Meds ordered this encounter  Medications   amLODipine (NORVASC) 5 MG tablet    Sig: Take 1 tablet (5 mg total) by mouth daily.    Dispense:  90 tablet    Refill:  3    There  are no discontinued medications.   Assessment and Plan    Abdominal Aortic Aneurysm (post-repair)   Post-repair abdominal aortic aneurysm with concern for potential leakage noted in November. Leakage can occur post-surgery and may take time to heal. If the aneurysm enlarges, it can be managed with a catheter-based procedure rather than open surgery. Order an abdominal duplex ultrasound in December to monitor the aneurysm. Schedule a follow-up appointment after the ultrasound to review results.  Hypertension   Hypertension with slightly elevated blood pressure on recent checks. He is on antihypertensive medication. Maintaining blood pressure below 130/80 mmHg is crucial to prevent expansion of the previously repaired aneurysm. Monitor blood pressure at home and report if it exceeds 130/80 mmHg. Prescribe amlodipine if blood pressure remains elevated, with instructions to monitor for leg swelling as a side effect. Send prescription to [facility]. Reinforce the importance of maintaining blood pressure below 130/80 mmHg.  Non-Hodgkin Lymphoma   Non-Hodgkin lymphoma previously evaluated at [facility]. He has opted not to pursue further treatment or evaluation, preferring to enjoy his current quality of life. Early treatment can stabilize the condition, but he is aware of the risks of rapid progression if untreated. A local consultation was suggested to assess stability, but  he is currently not interested. He understands that if the lymphoma becomes aggressive, treatment options may be limited. Respect his decision to forgo further evaluation and treatment for lymphoma at this time. Offer referral to a local oncologist if he decides to pursue further evaluation in the future.  Goals of Care   He and his family prefer quality of life over aggressive treatment for lymphoma. They are aware of the potential for rapid progression and the limitations of treatment if the condition worsens. They prefer to monitor  the situation and address issues if they arise, valuing current quality of life and spiritual beliefs. Respect his decision regarding treatment preferences. Provide support and guidance as needed.       Signed,  Yates Decamp, MD, [facility] 04/13/2023, 9:02 PM [facility] [address] [place], [place] [address] Phone: [phone]. Fax:  [phone]

## 2023-06-11 ENCOUNTER — Other Ambulatory Visit (INDEPENDENT_AMBULATORY_CARE_PROVIDER_SITE_OTHER): Payer: Medicare Other

## 2023-06-11 ENCOUNTER — Ambulatory Visit: Payer: Self-pay | Admitting: Family Medicine

## 2023-06-11 DIAGNOSIS — M1A9XX0 Chronic gout, unspecified, without tophus (tophi): Secondary | ICD-10-CM

## 2023-06-11 DIAGNOSIS — R7303 Prediabetes: Secondary | ICD-10-CM

## 2023-06-11 DIAGNOSIS — I10 Essential (primary) hypertension: Secondary | ICD-10-CM

## 2023-06-11 LAB — COMPREHENSIVE METABOLIC PANEL WITH GFR
ALT: 14 U/L (ref 0–53)
AST: 21 U/L (ref 0–37)
Albumin: 4.7 g/dL (ref 3.5–5.2)
Alkaline Phosphatase: 57 U/L (ref 39–117)
BUN: 26 mg/dL — ABNORMAL HIGH (ref 6–23)
CO2: 27 meq/L (ref 19–32)
Calcium: 10 mg/dL (ref 8.4–10.5)
Chloride: 106 meq/L (ref 96–112)
Creatinine, Ser: 1.34 mg/dL (ref 0.40–1.50)
GFR: 49.37 mL/min — ABNORMAL LOW (ref >60.00)
Glucose, Bld: 118 mg/dL — ABNORMAL HIGH (ref 70–99)
Potassium: 5 meq/L (ref 3.5–5.1)
Sodium: 140 meq/L (ref 135–145)
Total Bilirubin: 1.3 mg/dL — ABNORMAL HIGH (ref 0.2–1.2)
Total Protein: 7.3 g/dL (ref 6.0–8.3)

## 2023-06-11 LAB — CBC
HCT: 43.5 % (ref 39.0–52.0)
Hemoglobin: 14.7 g/dL (ref 13.0–17.0)
MCHC: 33.8 g/dL (ref 30.0–36.0)
MCV: 95.9 fl (ref 78.0–100.0)
Platelets: 188 10*3/uL (ref 150.0–400.0)
RBC: 4.54 Mil/uL (ref 4.22–5.81)
RDW: 12.6 % (ref 11.5–15.5)
WBC: 5.9 10*3/uL (ref 4.0–10.5)

## 2023-06-11 LAB — URIC ACID: Uric Acid, Serum: 8.2 mg/dL — ABNORMAL HIGH (ref 4.0–7.8)

## 2023-06-11 LAB — LIPID PANEL
Cholesterol: 159 mg/dL (ref 0–200)
HDL: 58.6 mg/dL (ref >39.00)
LDL Cholesterol: 75 mg/dL (ref 0–99)
NonHDL: 100.85
Total CHOL/HDL Ratio: 3
Triglycerides: 130 mg/dL (ref 0.0–149.0)
VLDL: 26 mg/dL (ref 0.0–40.0)

## 2023-06-11 LAB — HEMOGLOBIN A1C: Hgb A1c MFr Bld: 6.2 % (ref 4.6–6.5)

## 2023-06-15 ENCOUNTER — Other Ambulatory Visit: Payer: Self-pay | Admitting: Family Medicine

## 2023-06-15 DIAGNOSIS — I10 Essential (primary) hypertension: Secondary | ICD-10-CM

## 2023-06-18 ENCOUNTER — Ambulatory Visit: Payer: Medicare Other | Admitting: Family Medicine

## 2023-06-18 ENCOUNTER — Encounter: Payer: Self-pay | Admitting: Family Medicine

## 2023-06-18 VITALS — BP 122/74 | HR 75 | Temp 98.0°F | Resp 16 | Ht 67.0 in | Wt 183.0 lb

## 2023-06-18 DIAGNOSIS — I10 Essential (primary) hypertension: Secondary | ICD-10-CM

## 2023-06-18 DIAGNOSIS — E785 Hyperlipidemia, unspecified: Secondary | ICD-10-CM

## 2023-06-18 DIAGNOSIS — M1A9XX0 Chronic gout, unspecified, without tophus (tophi): Secondary | ICD-10-CM

## 2023-06-18 DIAGNOSIS — R7303 Prediabetes: Secondary | ICD-10-CM | POA: Diagnosis not present

## 2023-06-18 NOTE — Progress Notes (Signed)
 Chief Complaint  Patient presents with   Follow-up    Follow up     Subjective Brandon Bradford is a 83 y.o. male who presents for hypertension follow up. Here with wife who helps w translation ([place]).  He does monitor home blood pressures. Blood pressures ranging from 120's/70's on average. He is compliant with medications-amlodipine  5 mg daily, losartan  25 mg daily, Toprol -XL 50 mg daily. Patient has these side effects of medication: none He is adhering to a healthy diet overall. Current exercise: Golf No CP or SOB.   Hyperlipidemia Patient presents for dyslipidemia follow up. Currently being treated with Lipitor 20 mg daily and compliance with treatment thus far has been good. He denies myalgias. Diet/exercise as above. The patient is not known to have coexisting coronary artery disease.  Gout Patient has a history of gout taking allopurinol  100 mg daily.  He reports compliance and no adverse effects.  Colchicine  as needed.  Does a reasonable job avoiding purine rich foods.  Most recent uric acid 1 week ago was 8.2.   Past Medical History:  Diagnosis Date   AAA (abdominal aortic aneurysm) (HCC)    3.4 cm   Arthritis    Cataract 06/2016   bilateral cataract extraction   Clotting disorder (HCC)    Colitis, ischemic (HCC)    Gastritis    GERD (gastroesophageal reflux disease)    Hx of colonic polyps    Hyperlipidemia    Hypertension    Paroxysmal atrial fibrillation (HCC)    Vertigo, intermittent     Exam BP 122/74 (BP Location: Left Arm, Patient Position: Sitting)   Pulse 75   Temp 98 F (36.7 C) (Oral)   Resp 16   Ht 5\' 7"  (1.702 m)   Wt 183 lb (83 kg)   SpO2 97%   BMI 28.66 kg/m  General:  well developed, well nourished, in no apparent distress Heart: RRR, no bruits, no LE edema Lungs: clear to auscultation, no accessory muscle use Psych: well oriented with normal range of affect and appropriate judgment/insight  Essential hypertension - Plan: CBC,  Comprehensive metabolic panel with GFR, Lipid panel  Hyperlipidemia, unspecified hyperlipidemia type  Chronic gout involving toe of right foot without tophus, unspecified cause - Plan: Uric acid  Prediabetes - Plan: Hemoglobin A1c  Chronic, stable.  Continue Toprol  XL 50 mg daily, losartan  25 mg daily, amlodipine  5 mg daily.  Counseled on diet and exercise. Chronic, stable.  Continue Lipitor 20 mg daily. Chronic, unstable.  Go back on allopurinol  100 mg daily, colchicine  as needed. Tdap rec'd to get at the pharmacy.  F/u in 6 mo. The patient voiced understanding and agreement to the plan.  Shellie Dials [place], DO 06/18/23  1:48 PM

## 2023-06-18 NOTE — Patient Instructions (Addendum)
 Keep the diet clean and stay active.  Let us know if you need anything.

## 2023-08-24 DIAGNOSIS — M65342 Trigger finger, left ring finger: Secondary | ICD-10-CM | POA: Diagnosis not present

## 2023-08-24 DIAGNOSIS — M25511 Pain in right shoulder: Secondary | ICD-10-CM | POA: Diagnosis not present

## 2023-08-24 DIAGNOSIS — M79642 Pain in left hand: Secondary | ICD-10-CM | POA: Diagnosis not present

## 2023-08-24 DIAGNOSIS — G8929 Other chronic pain: Secondary | ICD-10-CM | POA: Diagnosis not present

## 2023-09-03 DIAGNOSIS — Z23 Encounter for immunization: Secondary | ICD-10-CM | POA: Diagnosis not present

## 2023-09-10 ENCOUNTER — Telehealth: Payer: Self-pay | Admitting: *Deleted

## 2023-09-10 ENCOUNTER — Ambulatory Visit: Admitting: *Deleted

## 2023-09-10 VITALS — BP 122/66 | HR 64 | Temp 97.9°F | Resp 18 | Ht 67.0 in | Wt 183.4 lb

## 2023-09-10 DIAGNOSIS — Z0000 Encounter for general adult medical examination without abnormal findings: Secondary | ICD-10-CM

## 2023-09-10 NOTE — Telephone Encounter (Signed)
 Pt had AWV today.  States he has not taken Amlodipine  for a year. States it makes him feel bad when he takes it.  Please advise if ok to remove from med list?  Also reports taking Xarelto  only when he feels like he needs it and says he has discussed this with cardiology.   Is only taking allopurinol  as needed and no longer takes Dicyclomine . Please advise if ok to remove the Dicyclomine ?  Pt has not been to eye doctor in 5 years, said he would consider scheduling but didn't seem confident that he would.  Pt scored 8 on 6-cit today but I think he was unsure how to relay some of the information in English and got frustrated and didn't finish reciting months of the year in reverse order.  Pt did mention that he has some forgetfulness. Forgets where he places items etc. He has heard that Lipitor could be related to memory loss and wonders if this could be contributing?

## 2023-09-10 NOTE — Patient Instructions (Signed)
 Brandon Bradford , Thank you for taking time out of your busy schedule to complete your Annual Wellness Visit with me. I enjoyed our conversation and look forward to speaking with you again next year. I, as well as your care team,  appreciate your ongoing commitment to your health goals. Please review the following plan we discussed and let me know if I can assist you in the future. Your Game plan/ To Do List     Follow up Visits: Next Medicare AWV with our clinical staff: 09/13/24 1pm    Next Office Visit with your provider: 12/20/23 9:30am  Clinician Recommendations:  Aim for 30 minutes of exercise or brisk walking, 6-8 glasses of water, and 5 servings of fruits and vegetables each day.       This is a list of the screening recommended for you and due dates:  Health Maintenance  Topic Date Due   Medicare Annual Wellness Visit  08/17/2018   COVID-19 Vaccine (9 - 2024-25 season) 10/29/2023   Colon Cancer Screening  10/15/2027   DTaP/Tdap/Td vaccine (4 - Td or Tdap) 02/04/2028   Pneumococcal Vaccine for age over 65  Completed   Flu Shot  Completed   Zoster (Shingles) Vaccine  Completed   HPV Vaccine  Aged Out   Meningitis B Vaccine  Aged Out   Hepatitis C Screening  Discontinued    Advanced directives: (Copy Requested) Please bring a copy of your health care power of attorney and living will to the office to be added to your chart at your convenience. You can mail to [facility] [address] [place], [place] [address] or email to [email or web address] Advance Care Planning is important because it:  [x]  Makes sure you receive the medical care that is consistent with your values, goals, and preferences  [x]  It provides guidance to your family and loved ones and reduces their decisional burden about whether or not they are making the right decisions based on your wishes.  Follow the link provided in your after visit summary or read over the paperwork we have mailed to you  to help you started getting your Advance Directives in place. If you need assistance in completing these, please reach out to us  so that we can help you!  See attachments for Preventive Care and Fall Prevention Tips.

## 2023-09-10 NOTE — Telephone Encounter (Signed)
 Looks like Dr. Ladona was rx'ing. Will remove as BP appears to have been controlled. I recommend allopurinol  daily as this prevents flares. I recommend daily use of Xarelto  but will defer to the prescribing cardiologist.  Will remove dicyclomine .

## 2023-09-10 NOTE — Progress Notes (Signed)
 Subjective:   Brandon Bradford is a 83 y.o. who presents for a Medicare Wellness preventive visit.  As a reminder, Annual Wellness Visits don't include a physical exam, and some assessments may be limited, especially if this visit is performed virtually. We may recommend an in-person follow-up visit with your provider if needed.  Visit Complete: In person  Persons Participating in Visit: Patient.  AWV Questionnaire: No: Patient Medicare AWV questionnaire was not completed prior to this visit.  Cardiac Risk Factors include: advanced age (>32men, >57 women);male gender;dyslipidemia;hypertension;Other (see comment), Risk factor comments: CKD     Objective:    Today's Vitals   09/10/23 1257  BP: 122/66  Pulse: 64  Resp: 18  Temp: 97.9 F (36.6 C)  TempSrc: Oral  SpO2: 94%  Weight: 183 lb 6.4 oz (83.2 kg)  Height: 5' 7 (1.702 m)   Body mass index is 28.72 kg/m.     09/10/2023    1:37 PM 07/24/2021    3:01 PM 07/29/2019    5:40 PM 02/04/2018   12:47 PM 01/26/2018    7:59 AM 03/29/2013    7:44 PM  Advanced Directives  Does Patient Have a Medical Advance Directive? Yes No No No  No  Patient does not have advance directive;Patient would not like information   Type of Public librarian Power of Bastian;Living will       Does patient want to make changes to medical advance directive? No - Patient declined       Copy of Healthcare Power of Attorney in Chart? No - copy requested          Data saved with a previous flowsheet row definition    Current Medications (verified) Outpatient Encounter Medications as of 09/10/2023  Medication Sig   allopurinol  (ZYLOPRIM ) 100 MG tablet Take 1 tablet (100 mg total) by mouth daily. (Patient taking differently: Take 100 mg by mouth daily. Takes as needed)   atorvastatin  (LIPITOR) 20 MG tablet TAKE 1 TABLET BY MOUTH DAILY   B Complex-C (SUPER B COMPLEX PO) Take 1 tablet by mouth daily.   betamethasone  valerate (VALISONE ) 0.1 % cream Apply 1  application topically as needed.   Cholecalciferol (VITAMIN D3) 5000 units CAPS Take 1 capsule by mouth daily.   colchicine  0.6 MG tablet Take 2 tabs at first sign of a flare and repeat 1 tab 1 hr later. Take 1 tab twice daily until flare resolves.   esomeprazole  (NEXIUM ) 20 MG capsule Take 20 mg by mouth as needed.    flecainide  (TAMBOCOR ) 50 MG tablet TAKE 3 TABLETS BY MOUTH AS DIRECTED FOR 1 DOSE. TAKE 2 TABLETS ONCE IF HEART RACING OCCURS   Ginkgo Biloba 40 MG TABS Take 1 tablet by mouth daily.   Glucosamine-Chondroitin 1500-1200 MG/30ML LIQD Take 1 tablet by mouth daily.   losartan  (COZAAR ) 25 MG tablet TAKE 1 TABLET BY MOUTH DAILY   Magnesium  400 MG CAPS Take 1 tablet by mouth daily.   metoprolol  succinate (TOPROL -XL) 50 MG 24 hr tablet TAKE 1 TABLET BY MOUTH DAILY   Multiple Vitamin (MULTIVITAMIN) tablet Take 1 tablet by mouth daily.   omeprazole  (PRILOSEC) 20 MG capsule Take 20 mg by mouth daily.   Pancrelipase , Lip-Prot-Amyl, 24000-76000 units CPEP Take 1 capsule (24,000 Units total) by mouth 3 (three) times daily. (Patient taking differently: Take 1 capsule by mouth as needed.)   Probiotic Product (PROBIOTIC-10 ULTIMATE) CAPS Take 1 capsule by mouth.   Rivaroxaban  (XARELTO ) 15 MG TABS tablet Take 1 tablet (  15 mg total) by mouth daily with supper. (Patient taking differently: Take 15 mg by mouth daily with supper. Only takes when he feels he needs it)   amLODipine  (NORVASC ) 5 MG tablet Take 1 tablet (5 mg total) by mouth daily. (Patient not taking: Reported on 09/10/2023)   dicyclomine  (BENTYL ) 10 MG capsule Take 1 capsule (10 mg total) by mouth 4 (four) times daily -  before meals and at bedtime. (Patient not taking: Reported on 09/10/2023)   No facility-administered encounter medications on file as of 09/10/2023.    Allergies (verified) Patient has no known allergies.   History: Past Medical History:  Diagnosis Date   AAA (abdominal aortic aneurysm) (HCC)    3.4 cm   Arthritis     Cataract 06/2016   bilateral cataract extraction   Clotting disorder (HCC)    Colitis, ischemic (HCC)    Gastritis    GERD (gastroesophageal reflux disease)    Hx of colonic polyps    Hyperlipidemia    Hypertension    Paroxysmal atrial fibrillation (HCC)    Vertigo, intermittent    Past Surgical History:  Procedure Laterality Date   ABDOMINAL AORTIC ENDOVASCULAR STENT GRAFT  04/2022   at [facility]   APPENDECTOMY  1971   CAROTID STENT     CATARACT EXTRACTION, BILATERAL  2020   HEMICOLECTOMY     Right   HEMORROIDECTOMY  2002   Family History  Problem Relation Age of Onset   Hypertension Mother    Colon cancer Neg Hx    Stomach cancer Neg Hx    Esophageal cancer Neg Hx    Colon polyps Neg Hx    Social History   Socioeconomic History   Marital status: Married    Spouse name: Not on file   Number of children: 4   Years of education: Not on file   Highest education level: Not on file  Occupational History   Occupation: RETIRED    Employer: RETIRED  Tobacco Use   Smoking status: Former    Current packs/day: 0.00    Types: Cigarettes    Quit date: 10/13/1995    Years since quitting: 27.9   Smokeless tobacco: Never  Vaping Use   Vaping status: Never Used  Substance and Sexual Activity   Alcohol use: No    Alcohol/week: 0.0 standard drinks of alcohol   Drug use: No   Sexual activity: Yes    Partners: Female  Other Topics Concern   Not on file  Social History Narrative   Not on file   Social Drivers of Health   Financial Resource Strain: Low Risk  (09/10/2023)   Overall Financial Resource Strain (CARDIA)    Difficulty of Paying Living Expenses: Not very hard  Food Insecurity: No Food Insecurity (09/10/2023)   Hunger Vital Sign    Worried About Running Out of Food in the Last Year: Never true    Ran Out of Food in the Last Year: Never true  Transportation Needs: No Transportation Needs (09/10/2023)   PRAPARE - Transportation    Lack of Transportation (Medical):  No    Lack of Transportation (Non-Medical): No  Physical Activity: Sufficiently Active (09/10/2023)   Exercise Vital Sign    Days of Exercise per Week: 4 days    Minutes of Exercise per Session: 90 min  Stress: No Stress Concern Present (09/10/2023)   [employer] of Occupational Health - Occupational Stress Questionnaire    Feeling of Stress: Not at all  Social Connections: Socially  Integrated (09/10/2023)   Social Connection and Isolation Panel    Frequency of Communication with Friends and Family: More than three times a week    Frequency of Social Gatherings with Friends and Family: Twice a week    Attends Religious Services: More than 4 times per year    Active Member of [employer] or Organizations: Yes    Attends Engineer, structural: More than 4 times per year    Marital Status: Married    Tobacco Counseling Counseling given: Not Answered    Clinical Intake:  Pre-visit preparation completed: Yes  Pain : No/denies pain     BMI - recorded: 28.72 Nutritional Status: BMI 25 -29 Overweight Nutritional Risks: None Diabetes: No  Lab Results  Component Value Date   HGBA1C 6.2 06/11/2023   HGBA1C 6.1 12/07/2022   HGBA1C 6.1 06/01/2022     How often do you need to have someone help you when you read instructions, pamphlets, or other written materials from your doctor or pharmacy?: 1 - Never  Interpreter Needed?: No  Information entered by :: Lolita Libra, CMA   Activities of Daily Living     09/10/2023    2:21 PM  In your present state of health, do you have any difficulty performing the following activities:  Hearing? 1  Comment has slight decrease, doesn't want work up at this time.  Vision? 0  Difficulty concentrating or making decisions? 0  Walking or climbing stairs? 0  Dressing or bathing? 0  Doing errands, shopping? 0  Preparing Food and eating ? N  Using the Toilet? N  In the past six months, have you accidently leaked urine? N  Do you  have problems with loss of bowel control? N  Managing your Medications? N  Managing your Finances? N  Housekeeping or managing your Housekeeping? N    Patient Care Team: Frann Mabel Mt, DO as PCP - General (Family Medicine) Ladona Heinz, MD as PCP - Cardiology (Cardiology)  I have updated your Care Teams any recent Medical Services you may have received from other providers in the past year.     Assessment:   This is a routine wellness examination for Chicken.  Hearing/Vision screen Hearing Screening - Comments:: Notices some decrease in hearing over the last year. Not ready for further workup. Vision Screening - Comments:: Has been 5 years and will consider scheduling routine exam.    Goals Addressed   None    Depression Screen     09/10/2023    1:18 PM 06/18/2023    1:50 PM 04/21/2022    2:06 PM 12/08/2021    8:12 AM 06/06/2021    2:01 PM 06/04/2020    1:50 PM 06/04/2020    1:25 PM  PHQ 2/9 Scores  PHQ - 2 Score 0 0 0 0 0 0 0  PHQ- 9 Score 1   0       Fall Risk     09/10/2023    1:14 PM 04/21/2022    2:06 PM 03/17/2022    3:31 PM 12/08/2021    8:11 AM 06/06/2021    1:42 PM  Fall Risk   Falls in the past year? 0 0 0 0 0  Number falls in past yr: 0 0 0 0 0  Injury with Fall? 0 0 0 0 0  Risk for fall due to : No Fall Risks   No Fall Risks No Fall Risks  Follow up Education provided Falls evaluation completed  Falls evaluation  completed  Falls evaluation completed      Data saved with a previous flowsheet row definition    MEDICARE RISK AT HOME:  Medicare Risk at Home Any stairs in or around the home?: No Home free of loose throw rugs in walkways, pet beds, electrical cords, etc?: Yes Adequate lighting in your home to reduce risk of falls?: Yes Life alert?: No Use of a cane, walker or w/c?: No Grab bars in the bathroom?: No Shower chair or bench in shower?: No Elevated toilet seat or a handicapped toilet?: No  TIMED UP AND GO:  Was the test performed?  Yes   Length of time to ambulate 10 feet: 6 sec Gait steady and fast without use of assistive device  Cognitive Function: 6CIT completed        09/10/2023    1:25 PM  6CIT Screen  What Year? 0 points  What month? 0 points  What time? 0 points  Count back from 20 0 points  Months in reverse 4 points  Repeat phrase 4 points  Total Score 8 points    Immunizations Immunization History  Administered Date(s) Administered   Fluad Quad(high Dose 65+) 10/24/2018, 11/07/2021   Influenza Split 10/29/2010, 10/05/2011   Influenza Whole 10/15/2008, 11/06/2008, 09/20/2009   Influenza, High Dose Seasonal PF 10/25/2017, 09/30/2019, 10/05/2022, 09/03/2023   Influenza,inj,Quad PF,6+ Mos 09/30/2019, 10/27/2020   Influenza,inj,quad, With Preservative 10/19/2016   Moderna Covid-19 Fall Seasonal Vaccine 9yrs & older 11/07/2021, 10/05/2022, 09/03/2023   PFIZER(Purple Top)SARS-COV-2 Vaccination 02/03/2019, 02/24/2019, 10/19/2019, 06/25/2020   Pfizer Covid-19 Vaccine Bivalent Booster 73yrs & up 02/10/2021   Pneumococcal Conjugate-13 05/26/2016   Pneumococcal Polysaccharide-23 11/22/2012   Td 10/09/2003, 11/22/2012   Tdap 02/03/2018   Zoster Recombinant(Shingrix ) 09/02/2017, 11/03/2017   Zoster, Live 11/08/2008    Screening Tests Health Maintenance  Topic Date Due   COVID-19 Vaccine (9 - 2024-25 season) 10/29/2023   Medicare Annual Wellness (AWV)  09/09/2024   Colonoscopy  10/15/2027   DTaP/Tdap/Td (4 - Td or Tdap) 02/04/2028   Pneumococcal Vaccine: 50+ Years  Completed   INFLUENZA VACCINE  Completed   Zoster Vaccines- Shingrix   Completed   HPV VACCINES  Aged Out   Meningococcal B Vaccine  Aged Out   Hepatitis C Screening  Discontinued    Health Maintenance  There are no preventive care reminders to display for this patient.  Health Maintenance Items Addressed: All HM up to date.  Additional Screening:  Vision Screening: Recommended annual ophthalmology exams for early detection of  glaucoma and other disorders of the eye. Would you like a referral to an eye doctor? No    Dental Screening: Recommended annual dental exams for proper oral hygiene  Community Resource Referral / Chronic Care Management: CRR required this visit?  No   CCM required this visit?  No   Plan:    I have personally reviewed and noted the following in the patient's chart:   Medical and social history Use of alcohol, tobacco or illicit drugs  Current medications and supplements including opioid prescriptions. Patient is not currently taking opioid prescriptions. Functional ability and status Nutritional status Physical activity Advanced directives List of other physicians Hospitalizations, surgeries, and ER visits in previous 12 months Vitals Screenings to include cognitive, depression, and falls Referrals and appointments  In addition, I have reviewed and discussed with patient certain preventive protocols, quality metrics, and best practice recommendations. A written personalized care plan for preventive services as well as general preventive health recommendations were provided to  patient.   Lolita Libra, CMA   09/10/2023   After Visit Summary: (In Person-Printed) AVS printed and given to the patient  Notes: see phone note

## 2023-09-10 NOTE — Telephone Encounter (Signed)
 Tried calling pt to discuss and not able to lvm mailed letter .

## 2023-09-10 NOTE — Addendum Note (Signed)
 Addended by: MAREE HUM A on: 09/10/2023 04:08 PM   Modules accepted: Orders

## 2023-09-24 NOTE — Progress Notes (Signed)
 Cardiology Office Note:  .   Date:  09/27/2023  ID:  Brandon Bradford, DOB Aug 17, 1940, MRN [id number] PCP: Frann Mabel Mt, DO  [facility] Providers Cardiologist:  Gordy Bergamo, MD   History of Present Illness: .   Brandon Bradford is a 83 y.o. Asian male with hypertension, stage IIIa chronic kidney disease, hyperlipidemia, paroxysmal atrial fibrillation presently well-managed with flecainide  as needed and Xarelto  15 mg in the evening, he underwent EVAR on 05/15/2022 with bilateral iliac extension. He was also diagnosed with non-Hodgkin's lymphoma at [facility] and was also having some difficulty with swallowing with pharyngoesophageal dysphagia, he was referred for hematology and also GI when he was seen last by me on 12/30/2022.  Patient states that he would prefer holistic approach to his medical issues and does not want any further workup with regard to possibility of lymphoma.  He and his wife enjoy life, they play golf regularly, states that he is essentially asymptomatic except for episodes of palpitations.  He has been taking Xarelto  3-4 times a week because of palpitations and has not had to use flecainide  in quite a while as palpitations are only brief.  Cardiac Studies relevent.    Abdominal Aortic Duplex 12/16/2022   Abdominal Aorta: The largest aortic measurement is 3.9 cm. Patent  endovascular aneurysm repair with evidence of endoleak. The largest aortic diameter has decreased compared to prior exam. Previous diameter  measurement was 4.2 cm obtained on 09/12/2021.  Labs   Lab Results  Component Value Date   CHOL 159 06/11/2023   HDL 58.60 06/11/2023   LDLCALC 75 06/11/2023   TRIG 130.0 06/11/2023   CHOLHDL 3 06/11/2023   No results found for: LIPOA  Recent Labs    12/07/22 0753 06/11/23 0728  NA 140 140  K 4.8 5.0  CL 101 106  CO2 29 27  GLUCOSE 116* 118*  BUN 22 26*  CREATININE 1.34 1.34  CALCIUM  10.3 10.0    Lab Results  Component Value  Date   ALT 14 06/11/2023   AST 21 06/11/2023   ALKPHOS 57 06/11/2023   BILITOT 1.3 (H) 06/11/2023      Latest Ref Rng & Units 06/11/2023    7:28 AM 07/24/2021    4:01 PM 01/15/2021    7:45 AM  CBC  WBC 4.0 - 10.5 K/uL 5.9  7.1  5.6   Hemoglobin 13.0 - 17.0 g/dL 85.2  85.4  85.3   Hematocrit 39.0 - 52.0 % 43.5  42.4  43.3   Platelets 150.0 - 400.0 K/uL 188.0  223  213.0    Lab Results  Component Value Date   HGBA1C 6.2 06/11/2023    Lab Results  Component Value Date   TSH 2.440 07/24/2021   ROS  Review of Systems  Cardiovascular:  Positive for palpitations (occasional). Negative for chest pain, dyspnea on exertion and leg swelling.   Physical Exam:   VS:  BP 138/82 (BP Location: Left Arm, Patient Position: Sitting)   Pulse 71   Resp 16   Ht 5' 7 (1.702 m)   Wt 181 lb (82.1 kg)   SpO2 97%   BMI 28.35 kg/m    Wt Readings from Last 3 Encounters:  09/27/23 181 lb (82.1 kg)  09/10/23 183 lb 6.4 oz (83.2 kg)  06/18/23 183 lb (83 kg)    BP Readings from Last 3 Encounters:  09/27/23 138/82  09/10/23 122/66  06/18/23 122/74   Physical Exam Neck:  Vascular: No carotid bruit or JVD.  Cardiovascular:     Rate and Rhythm: Normal rate and regular rhythm.     Pulses: Intact distal pulses.     Heart sounds: Normal heart sounds. No murmur heard.    No gallop.  Pulmonary:     Effort: Pulmonary effort is normal.     Breath sounds: Normal breath sounds.  Abdominal:     General: Bowel sounds are normal.     Palpations: Abdomen is soft.  Musculoskeletal:     Right lower leg: No edema.     Left lower leg: No edema.    EKG:         ASSESSMENT AND PLAN: .      ICD-10-CM   1. Paroxysmal atrial fibrillation (HCC)  I48.0 Rivaroxaban  (XARELTO ) 15 MG TABS tablet    DISCONTINUED: Rivaroxaban  (XARELTO ) 15 MG TABS tablet    2. Primary hypertension  I10     3. 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 Hypercholesteremia  E78.00      1. Paroxysmal atrial fibrillation (HCC) (Primary) Patient has not been taking Xarelto  on a daily basis but has been taking it as needed basis when he has prolonged episodes of what appears to be atrial fibrillation that has previously been well-documented.  Advised him that his risk of stroke is fairly high in view of his age, hypertension, aortic atherosclerosis hence advised him to take Xarelto  on a daily basis.  He does have mild renal insufficiency and he is presently on appropriate dose of Xarelto .  He can continue to use flecainide  on a as needed basis.  He is presently doing well with regard to this. - Rivaroxaban  (XARELTO ) 15 MG TABS tablet; Take 1 tablet (15 mg total) by mouth daily with supper.  Dispense: 90 tablet; Refill: 3  2. Primary hypertension Blood pressure is well-controlled on losartan  25 mg daily and metoprolol  succinate 50 mg daily.  Blood pressure slightly elevated here however blood pressure at home has been less than 730 mmHg.  Hence I did not make any changes to his medications.  3. 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  Also reviewed his lipids, excellent control of LDL on atorvastatin  20 mg daily.  4. Hypercholesteremia   Follow up: 1 for PAF, Hypertension and AAA  Signed,  Gordy Bergamo, MD, [facility] 09/27/2023, 1:40 PM [facility] [address] [place], [place] [address] Phone: [phone]. Fax:  [phone]

## 2023-09-27 ENCOUNTER — Encounter: Payer: Self-pay | Admitting: Cardiology

## 2023-09-27 ENCOUNTER — Ambulatory Visit: Attending: Cardiology | Admitting: Cardiology

## 2023-09-27 VITALS — BP 138/82 | HR 71 | Resp 16 | Ht 67.0 in | Wt 181.0 lb

## 2023-09-27 DIAGNOSIS — Z8679 Personal history of other diseases of the circulatory system: Secondary | ICD-10-CM | POA: Diagnosis not present

## 2023-09-27 DIAGNOSIS — Z9889 Other specified postprocedural states: Secondary | ICD-10-CM | POA: Diagnosis not present

## 2023-09-27 DIAGNOSIS — I10 Essential (primary) hypertension: Secondary | ICD-10-CM | POA: Diagnosis not present

## 2023-09-27 DIAGNOSIS — E7800 Pure hypercholesterolemia, unspecified: Secondary | ICD-10-CM | POA: Insufficient documentation

## 2023-09-27 DIAGNOSIS — I480 Paroxysmal atrial fibrillation: Secondary | ICD-10-CM | POA: Diagnosis not present

## 2023-09-27 MED ORDER — RIVAROXABAN 15 MG PO TABS: 15.0000 mg | ORAL_TABLET | Freq: Every day | ORAL | 1 refills | Status: DC

## 2023-09-27 MED ORDER — RIVAROXABAN 15 MG PO TABS
15.0000 mg | ORAL_TABLET | Freq: Every day | ORAL | 3 refills | Status: AC
Start: 2023-09-27 — End: ?

## 2023-09-27 NOTE — Patient Instructions (Signed)
 Medication Instructions:  Refilled Xarelto  *If you need a refill on your cardiac medications before your next appointment, please call your pharmacy*  Lab Work: NONE If you have labs (blood work) drawn today and your tests are completely normal, you will receive your results only by: MyChart Message (if you have MyChart) OR A paper copy in the mail If you have any lab test that is abnormal or we need to change your treatment, we will call you to review the results.  Testing/Procedures: NONE  Follow-Up: At [facility], you and your health needs are our priority.  As part of our continuing mission to provide you with exceptional heart care, our providers are all part of one team.  This team includes your primary Cardiologist (physician) and Advanced Practice Providers or APPs (Physician Assistants and Nurse Practitioners) who all work together to provide you with the care you need, when you need it.  Your next appointment:   1 Year  Provider:   Gordy Bergamo, MD    We recommend signing up for the patient portal called MyChart.  Sign up information is provided on this After Visit Summary.  MyChart is used to connect with patients for Virtual Visits (Telemedicine).  Patients are able to view lab/test results, encounter notes, upcoming appointments, etc.  Non-urgent messages can be sent to your provider as well.   To learn more about what you can do with MyChart, go to [email or web address].

## 2023-10-05 ENCOUNTER — Ambulatory Visit

## 2023-12-03 DIAGNOSIS — M65342 Trigger finger, left ring finger: Secondary | ICD-10-CM | POA: Diagnosis not present

## 2023-12-03 DIAGNOSIS — M79642 Pain in left hand: Secondary | ICD-10-CM | POA: Diagnosis not present

## 2023-12-13 ENCOUNTER — Other Ambulatory Visit (INDEPENDENT_AMBULATORY_CARE_PROVIDER_SITE_OTHER)

## 2023-12-13 ENCOUNTER — Ambulatory Visit: Payer: Self-pay | Admitting: Family Medicine

## 2023-12-13 DIAGNOSIS — M1A9XX0 Chronic gout, unspecified, without tophus (tophi): Secondary | ICD-10-CM | POA: Diagnosis not present

## 2023-12-13 DIAGNOSIS — I10 Essential (primary) hypertension: Secondary | ICD-10-CM

## 2023-12-13 DIAGNOSIS — R7303 Prediabetes: Secondary | ICD-10-CM | POA: Diagnosis not present

## 2023-12-13 LAB — LIPID PANEL
Cholesterol: 173 mg/dL (ref 0–200)
HDL: 57.4 mg/dL (ref >39.00)
LDL Cholesterol: 75 mg/dL (ref 0–99)
NonHDL: 115.62
Total CHOL/HDL Ratio: 3
Triglycerides: 205 mg/dL — ABNORMAL HIGH (ref 0.0–149.0)
VLDL: 41 mg/dL — ABNORMAL HIGH (ref 0.0–40.0)

## 2023-12-13 LAB — COMPREHENSIVE METABOLIC PANEL WITH GFR
ALT: 18 U/L (ref 0–53)
AST: 21 U/L (ref 0–37)
Albumin: 4.8 g/dL (ref 3.5–5.2)
Alkaline Phosphatase: 53 U/L (ref 39–117)
BUN: 25 mg/dL — ABNORMAL HIGH (ref 6–23)
CO2: 31 meq/L (ref 19–32)
Calcium: 10 mg/dL (ref 8.4–10.5)
Chloride: 102 meq/L (ref 96–112)
Creatinine, Ser: 1.7 mg/dL — ABNORMAL HIGH (ref 0.40–1.50)
GFR: 36.97 mL/min — ABNORMAL LOW (ref >60.00)
Glucose, Bld: 111 mg/dL — ABNORMAL HIGH (ref 70–99)
Potassium: 4.8 meq/L (ref 3.5–5.1)
Sodium: 139 meq/L (ref 135–145)
Total Bilirubin: 1.6 mg/dL — ABNORMAL HIGH (ref 0.2–1.2)
Total Protein: 7.3 g/dL (ref 6.0–8.3)

## 2023-12-13 LAB — CBC
HCT: 41.9 % (ref 39.0–52.0)
Hemoglobin: 14.2 g/dL (ref 13.0–17.0)
MCHC: 33.8 g/dL (ref 30.0–36.0)
MCV: 98.2 fl (ref 78.0–100.0)
Platelets: 190 K/uL (ref 150.0–400.0)
RBC: 4.27 Mil/uL (ref 4.22–5.81)
RDW: 12.7 % (ref 11.5–15.5)
WBC: 6.4 K/uL (ref 4.0–10.5)

## 2023-12-13 LAB — HEMOGLOBIN A1C: Hgb A1c MFr Bld: 5.9 % (ref 4.6–6.5)

## 2023-12-13 LAB — URIC ACID: Uric Acid, Serum: 8.9 mg/dL — ABNORMAL HIGH (ref 4.0–7.8)

## 2023-12-20 ENCOUNTER — Ambulatory Visit: Admitting: Family Medicine

## 2023-12-20 ENCOUNTER — Encounter: Payer: Self-pay | Admitting: Family Medicine

## 2023-12-20 VITALS — BP 130/82 | HR 74 | Temp 98.0°F | Resp 16 | Ht 67.0 in | Wt 182.0 lb

## 2023-12-20 DIAGNOSIS — R7303 Prediabetes: Secondary | ICD-10-CM

## 2023-12-20 DIAGNOSIS — E785 Hyperlipidemia, unspecified: Secondary | ICD-10-CM

## 2023-12-20 DIAGNOSIS — D489 Neoplasm of uncertain behavior, unspecified: Secondary | ICD-10-CM

## 2023-12-20 DIAGNOSIS — L570 Actinic keratosis: Secondary | ICD-10-CM

## 2023-12-20 DIAGNOSIS — I10 Essential (primary) hypertension: Secondary | ICD-10-CM

## 2023-12-20 DIAGNOSIS — M1A9XX0 Chronic gout, unspecified, without tophus (tophi): Secondary | ICD-10-CM

## 2023-12-20 MED ORDER — LOSARTAN POTASSIUM 25 MG PO TABS: 25.0000 mg | ORAL_TABLET | Freq: Every day | ORAL | 3 refills | Status: AC

## 2023-12-20 MED ORDER — METOPROLOL SUCCINATE ER 50 MG PO TB24: 50.0000 mg | ORAL_TABLET | Freq: Every day | ORAL | 3 refills | Status: AC

## 2023-12-20 MED ORDER — ATORVASTATIN CALCIUM 20 MG PO TABS: 20.0000 mg | ORAL_TABLET | Freq: Every day | ORAL | 3 refills | Status: AC

## 2023-12-20 MED ORDER — ALLOPURINOL 100 MG PO TABS: 200.0000 mg | ORAL_TABLET | Freq: Every day | ORAL | 3 refills | Status: AC

## 2023-12-20 NOTE — Patient Instructions (Addendum)
 Please take allopurinol  daily.  Consider Beano for decreased gas production.   If you do not hear anything about your referral in the next 1-2 weeks, call our office and ask for an update.  Foods to AVOID: Red meat, organ meat (liver), lunch meat, seafood (mussels, scallops, anchovies, etc) Alcohol Sugary foods/beverages (diet soft drinks have no link to flares)  Foods to migrate to: Dairy Vegetables Cherries have limited data to suggest they help lower uric acid levels (and prevent flares) Vit C (500 mg daily) may have a modest effect with preventing flares Poultry If you are going to eat red meat, beef and pork may give you less problems than lamb.  Keep the diet clean and stay active.  Let us  know if you need anything.

## 2023-12-20 NOTE — Progress Notes (Signed)
 Chief Complaint  Patient presents with   Follow-up    Follow Up    Subjective Brandon Bradford is a 83 y.o. male who presents for hypertension follow up.  He is here with his spouse who helps interpret at times. He does not monitor home blood pressures. He is compliant with medications-losartan  25 mg daily, metoprolol  XL 50 mg daily. Patient has these side effects of medication: none He is adhering to a healthy diet overall. Current exercise: Golfing, strength training No CP or SOB.   Hyperlipidemia Patient presents for dyslipidemia follow up. Currently being treated with Lipitor 20 mg daily and compliance with treatment thus far has been good. He denies myalgias. Diet/exercise as above. The patient is not known to have coexisting coronary artery disease.  Gout Patient has a history of gout.  He takes allopurinol  100 mg daily and colchicine  as needed.  No adverse effects.  Most recent uric acid was 8.9.  His most recent flare was recent.  Skin lesion The patient has had a skin lesion on the left side of his face for the past couple years.  It does not seem to change unless he cuts it while shaving.  There is no pain, drainage, or itching otherwise.  No new lotions, soaps, topicals, or detergents.  He has not tried anything topically for this.  He does not see a skin doctor.  It does not seem to be changing in size.   Past Medical History:  Diagnosis Date   AAA (abdominal aortic aneurysm)    3.4 cm   Arthritis    Cataract 06/2016   bilateral cataract extraction   Clotting disorder    Colitis, ischemic    Gastritis    GERD (gastroesophageal reflux disease)    Hx of colonic polyps    Hyperlipidemia    Hypertension    Paroxysmal atrial fibrillation (HCC)    Vertigo, intermittent     Exam BP 130/82 (BP Location: Left Arm, Patient Position: Sitting)   Pulse 74   Temp 98 F (36.7 C) (Oral)   Resp 16   Ht 5' 7 (1.702 m)   Wt 182 lb (82.6 kg)   SpO2 97%   BMI 28.51 kg/m   General:  well developed, well nourished, in no apparent distress Skin: Over the left portion of his face, there is a slightly raised circular lesion with central excoriation (he reports he cut it this morning shaving) without obvious telangiectasias.  There is no hyperpigmentation.  Just above this area is a scaly patch with an erythematous base.  No TTP, excessive warmth, drainage. Heart: RRR, no bruits, no LE edema Lungs: clear to auscultation, no accessory muscle use Psych: well oriented with normal range of affect and appropriate judgment/insight  Procedure note: cryotherapy Verbal consent obtained 2 skin lesions treated- one AK and one neoplasm of uncertain behavior, possible BCC Liquid nitrogen was applied via a thin spray creating an ice ball with 1-2 mm corona surrounding the lesion The patient tolerated the procedure well There were no immediate complications noted  Essential hypertension - Plan: Comprehensive metabolic panel with GFR, CBC with Differential/Platelet, losartan  (COZAAR ) 25 MG tablet, metoprolol  succinate (TOPROL -XL) 50 MG 24 hr tablet  Hyperlipidemia, unspecified hyperlipidemia type - Plan: Lipid panel, atorvastatin  (LIPITOR) 20 MG tablet  Chronic gout involving toe of right foot without tophus, unspecified cause - Plan: Uric acid  Prediabetes - Plan: Hemoglobin A1c  Neoplasm of uncertain behavior - Plan: Ambulatory referral to Dermatology  Actinic keratosis - Plan:  PR DESTRUCTION PREMALIGNANT LESION 1ST  Chronic, stable.  Continue losartan  25 mg daily, Toprol  XL 50 mg daily.  Counseled on diet and exercise. Chronic, stable.  Continue Lipitor 20 mg daily. Chronic, unstable.  Increase allopurinol  from 100 mg daily to 200 mg daily.  Continue colchicine  as needed.  Gout dietary advice provided in AVS. Monitor. Cryotherapy today for AK and neoplasm of uncertain behavior.  Refer to dermatology as a contingency.  Could consider biopsy if it is a long wait to get into  dermatology. F/u in 6 mo. The patient and his spouse voiced understanding and agreement to the plan.  Mabel [place], DO 12/20/23  9:47 AM

## 2023-12-21 ENCOUNTER — Ambulatory Visit: Payer: Self-pay | Admitting: Cardiology

## 2023-12-21 ENCOUNTER — Ambulatory Visit (HOSPITAL_COMMUNITY)
Admission: RE | Admit: 2023-12-21 | Discharge: 2023-12-21 | Disposition: A | Source: Ambulatory Visit | Attending: Cardiology | Admitting: Cardiology

## 2023-12-21 DIAGNOSIS — Z8679 Personal history of other diseases of the circulatory system: Secondary | ICD-10-CM | POA: Diagnosis not present

## 2023-12-21 DIAGNOSIS — I714 Abdominal aortic aneurysm, without rupture, unspecified: Secondary | ICD-10-CM | POA: Diagnosis not present

## 2023-12-21 DIAGNOSIS — Z9889 Other specified postprocedural states: Secondary | ICD-10-CM | POA: Diagnosis not present

## 2023-12-21 NOTE — Progress Notes (Signed)
 Abdominal aortic aneurysm repair is very stable with good exclusion of the aneurysm.  Recheck in 1 to 2 years.

## 2023-12-21 NOTE — Telephone Encounter (Signed)
 Called and spoke with patient's wife regarding his  recent AAA results. Patient's wife verbalized understanding.

## 2023-12-28 DIAGNOSIS — Z23 Encounter for immunization: Secondary | ICD-10-CM | POA: Diagnosis not present

## 2024-06-13 ENCOUNTER — Other Ambulatory Visit

## 2024-06-19 ENCOUNTER — Ambulatory Visit: Admitting: Family Medicine

## 2024-09-13 ENCOUNTER — Ambulatory Visit
